# Patient Record
Sex: Male | Born: 1950
Health system: Southern US, Community
[De-identification: ages and names within clinical notes are randomized; demographics above are authoritative.]

## PROBLEM LIST (undated history)

## (undated) DIAGNOSIS — Z8719 Personal history of other diseases of the digestive system: Secondary | ICD-10-CM

## (undated) DIAGNOSIS — I739 Peripheral vascular disease, unspecified: Secondary | ICD-10-CM

## (undated) DIAGNOSIS — D649 Anemia, unspecified: Secondary | ICD-10-CM

## (undated) DIAGNOSIS — Z8711 Personal history of peptic ulcer disease: Secondary | ICD-10-CM

## (undated) DIAGNOSIS — I350 Nonrheumatic aortic (valve) stenosis: Secondary | ICD-10-CM

## (undated) DIAGNOSIS — M1990 Unspecified osteoarthritis, unspecified site: Secondary | ICD-10-CM

## (undated) DIAGNOSIS — E785 Hyperlipidemia, unspecified: Secondary | ICD-10-CM

## (undated) DIAGNOSIS — R011 Cardiac murmur, unspecified: Secondary | ICD-10-CM

## (undated) DIAGNOSIS — I10 Essential (primary) hypertension: Secondary | ICD-10-CM

## (undated) DIAGNOSIS — G629 Polyneuropathy, unspecified: Secondary | ICD-10-CM

## (undated) HISTORY — DX: Personal history of other diseases of the digestive system: Z87.19

## (undated) HISTORY — DX: Essential (primary) hypertension: I10

## (undated) HISTORY — DX: Hyperlipidemia, unspecified: E78.5

## (undated) HISTORY — DX: Personal history of peptic ulcer disease: Z87.11

## (undated) HISTORY — PX: CARPAL TUNNEL RELEASE: SHX101

## (undated) HISTORY — DX: Peripheral vascular disease, unspecified: I73.9

## (undated) HISTORY — DX: Unspecified osteoarthritis, unspecified site: M19.90

## (undated) HISTORY — DX: Nonrheumatic aortic (valve) stenosis: I35.0

---

## 1995-06-19 HISTORY — PX: BACK SURGERY: SHX140

## 2001-08-28 ENCOUNTER — Encounter: Payer: Self-pay | Admitting: Neurosurgery

## 2001-08-28 ENCOUNTER — Encounter: Admission: RE | Admit: 2001-08-28 | Discharge: 2001-08-28 | Payer: Self-pay | Admitting: Neurosurgery

## 2001-09-11 ENCOUNTER — Encounter: Payer: Self-pay | Admitting: Neurosurgery

## 2001-09-11 ENCOUNTER — Encounter: Admission: RE | Admit: 2001-09-11 | Discharge: 2001-09-11 | Payer: Self-pay | Admitting: Neurosurgery

## 2001-09-25 ENCOUNTER — Encounter: Admission: RE | Admit: 2001-09-25 | Discharge: 2001-09-25 | Payer: Self-pay | Admitting: Neurosurgery

## 2001-09-25 ENCOUNTER — Encounter: Payer: Self-pay | Admitting: Neurosurgery

## 2003-03-11 ENCOUNTER — Encounter: Payer: Self-pay | Admitting: Radiology

## 2003-03-11 ENCOUNTER — Encounter: Payer: Self-pay | Admitting: Neurosurgery

## 2003-03-11 ENCOUNTER — Encounter: Admission: RE | Admit: 2003-03-11 | Discharge: 2003-03-11 | Payer: Self-pay | Admitting: Neurosurgery

## 2003-03-26 ENCOUNTER — Encounter: Admission: RE | Admit: 2003-03-26 | Discharge: 2003-03-26 | Payer: Self-pay | Admitting: Neurosurgery

## 2003-03-26 ENCOUNTER — Encounter: Payer: Self-pay | Admitting: Neurosurgery

## 2003-04-19 ENCOUNTER — Encounter: Admission: RE | Admit: 2003-04-19 | Discharge: 2003-04-19 | Payer: Self-pay | Admitting: Neurosurgery

## 2005-12-13 ENCOUNTER — Ambulatory Visit (HOSPITAL_COMMUNITY): Admission: RE | Admit: 2005-12-13 | Discharge: 2005-12-13 | Payer: Self-pay | Admitting: Neurosurgery

## 2009-02-16 HISTORY — PX: COLONOSCOPY: SHX174

## 2011-02-07 ENCOUNTER — Ambulatory Visit (HOSPITAL_COMMUNITY)
Admission: RE | Admit: 2011-02-07 | Discharge: 2011-02-07 | Disposition: A | Discharge status: Home / Self Care | Payer: BC Managed Care – PPO | Source: Ambulatory Visit | Attending: Family Medicine | Admitting: Family Medicine

## 2011-02-07 ENCOUNTER — Other Ambulatory Visit (HOSPITAL_COMMUNITY): Payer: Self-pay | Admitting: Family Medicine

## 2011-02-07 DIAGNOSIS — IMO0002 Reserved for concepts with insufficient information to code with codable children: Secondary | ICD-10-CM

## 2011-02-07 DIAGNOSIS — M549 Dorsalgia, unspecified: Secondary | ICD-10-CM

## 2011-02-07 DIAGNOSIS — M545 Low back pain, unspecified: Secondary | ICD-10-CM | POA: Insufficient documentation

## 2011-02-07 DIAGNOSIS — M51379 Other intervertebral disc degeneration, lumbosacral region without mention of lumbar back pain or lower extremity pain: Secondary | ICD-10-CM | POA: Insufficient documentation

## 2011-02-07 DIAGNOSIS — M79609 Pain in unspecified limb: Secondary | ICD-10-CM | POA: Insufficient documentation

## 2011-02-07 DIAGNOSIS — M5137 Other intervertebral disc degeneration, lumbosacral region: Secondary | ICD-10-CM | POA: Insufficient documentation

## 2011-04-11 ENCOUNTER — Emergency Department (HOSPITAL_COMMUNITY): Payer: No Typology Code available for payment source

## 2011-04-11 ENCOUNTER — Emergency Department (HOSPITAL_COMMUNITY)
Admission: EM | Admit: 2011-04-11 | Discharge: 2011-04-11 | Disposition: A | Discharge status: Home / Self Care | Payer: No Typology Code available for payment source | Attending: Emergency Medicine | Admitting: Emergency Medicine

## 2011-04-11 DIAGNOSIS — M545 Low back pain, unspecified: Secondary | ICD-10-CM | POA: Insufficient documentation

## 2011-04-11 DIAGNOSIS — S32009A Unspecified fracture of unspecified lumbar vertebra, initial encounter for closed fracture: Secondary | ICD-10-CM | POA: Insufficient documentation

## 2011-04-11 DIAGNOSIS — I10 Essential (primary) hypertension: Secondary | ICD-10-CM | POA: Insufficient documentation

## 2011-04-20 ENCOUNTER — Other Ambulatory Visit: Payer: Self-pay | Admitting: Neurological Surgery

## 2011-04-20 DIAGNOSIS — M545 Low back pain, unspecified: Secondary | ICD-10-CM

## 2011-04-26 ENCOUNTER — Ambulatory Visit: Payer: BC Managed Care – PPO | Admitting: Orthopedic Surgery

## 2011-04-27 ENCOUNTER — Ambulatory Visit
Admission: RE | Admit: 2011-04-27 | Discharge: 2011-04-27 | Disposition: A | Discharge status: Home / Self Care | Payer: BC Managed Care – PPO | Source: Ambulatory Visit | Attending: Neurological Surgery | Admitting: Neurological Surgery

## 2011-04-27 DIAGNOSIS — M545 Low back pain, unspecified: Secondary | ICD-10-CM

## 2011-07-23 ENCOUNTER — Ambulatory Visit
Admission: RE | Admit: 2011-07-23 | Discharge: 2011-07-23 | Disposition: A | Discharge status: Home / Self Care | Payer: BC Managed Care – PPO | Source: Ambulatory Visit | Attending: Neurological Surgery | Admitting: Neurological Surgery

## 2011-07-23 ENCOUNTER — Other Ambulatory Visit: Payer: Self-pay | Admitting: Neurological Surgery

## 2011-07-23 DIAGNOSIS — IMO0002 Reserved for concepts with insufficient information to code with codable children: Secondary | ICD-10-CM

## 2011-07-23 DIAGNOSIS — M5126 Other intervertebral disc displacement, lumbar region: Secondary | ICD-10-CM

## 2011-08-06 ENCOUNTER — Other Ambulatory Visit: Payer: Self-pay | Admitting: Neurological Surgery

## 2011-08-06 DIAGNOSIS — M545 Low back pain, unspecified: Secondary | ICD-10-CM

## 2011-08-16 ENCOUNTER — Ambulatory Visit
Admission: RE | Admit: 2011-08-16 | Discharge: 2011-08-16 | Disposition: A | Discharge status: Home / Self Care | Payer: BC Managed Care – PPO | Source: Ambulatory Visit | Attending: Neurological Surgery | Admitting: Neurological Surgery

## 2011-08-16 DIAGNOSIS — M545 Low back pain, unspecified: Secondary | ICD-10-CM

## 2011-08-16 MED ORDER — DIAZEPAM 5 MG PO TABS
10.0000 mg | ORAL_TABLET | Freq: Once | ORAL | Status: AC
  Administered 2011-08-16: 10 mg via ORAL

## 2011-08-16 MED ORDER — HYDROCODONE-ACETAMINOPHEN 5-325 MG PO TABS
2.0000 | ORAL_TABLET | Freq: Once | ORAL | Status: AC
  Administered 2011-08-16: 2 via ORAL

## 2011-08-16 NOTE — Progress Notes (Signed)
Patient states he has been off Ultram for the past two days.  jkl

## 2011-08-17 NOTE — Discharge Instructions (Signed)
Myelogram Discharge Instructions  1. Go home and rest quietly for the next 24 hours.  It is important to lie flat for the next 24 hours.  Get up only to go to the restroom.  You may lie in the bed or on a couch on your back, your stomach, your left side or your right side.  You may have one pillow under your head.  You may have pillows between your knees while you are on your side or under your knees while you are on your back.  2. DO NOT drive today.  Recline the seat as far back as it will go, while still wearing your seat belt, on the way home.  3. You may get up to go to the bathroom as needed.  You may sit up for 10 minutes to eat.  You may resume your normal diet and medications unless otherwise indicated.  Drink lots of extra fluids today and tomorrow.  4. The incidence of headache, nausea, or vomiting is about 5% (one in 20 patients).  If you develop a headache, lie flat and drink plenty of fluids until the headache goes away.  Caffeinated beverages may be helpful.  If you develop severe nausea and vomiting or a headache that does not go away with flat bed rest, call [phone].  5. You may resume normal activities after your 24 hours of bed rest is over; however, do not exert yourself strongly or do any heavy lifting tomorrow.  6. Call your physician for a follow-up appointment.  The results of your myelogram will be sent directly to your physician by the following day.  7. If you have any questions or if complications develop after you arrive home, please call [phone].  Discharge instructions have been explained to the patient.  The patient, or the person responsible for the patient, fully understands these instructions.    May resume ultram on August 17, 2011 after 11:00 am.

## 2012-04-22 ENCOUNTER — Other Ambulatory Visit: Payer: Self-pay | Admitting: Neurological Surgery

## 2013-07-03 ENCOUNTER — Other Ambulatory Visit (HOSPITAL_COMMUNITY): Payer: Self-pay | Admitting: Family Medicine

## 2013-07-03 ENCOUNTER — Ambulatory Visit (HOSPITAL_COMMUNITY)
Admission: RE | Admit: 2013-07-03 | Discharge: 2013-07-03 | Disposition: A | Discharge status: Home / Self Care | Payer: BC Managed Care – PPO | Source: Ambulatory Visit | Attending: Family Medicine | Admitting: Family Medicine

## 2013-07-03 DIAGNOSIS — R0609 Other forms of dyspnea: Secondary | ICD-10-CM | POA: Insufficient documentation

## 2013-07-03 DIAGNOSIS — R0602 Shortness of breath: Secondary | ICD-10-CM | POA: Insufficient documentation

## 2013-07-03 DIAGNOSIS — R0989 Other specified symptoms and signs involving the circulatory and respiratory systems: Secondary | ICD-10-CM | POA: Insufficient documentation

## 2013-07-03 DIAGNOSIS — R0600 Dyspnea, unspecified: Secondary | ICD-10-CM

## 2013-07-03 DIAGNOSIS — R61 Generalized hyperhidrosis: Secondary | ICD-10-CM | POA: Insufficient documentation

## 2015-07-19 DIAGNOSIS — M47816 Spondylosis without myelopathy or radiculopathy, lumbar region: Secondary | ICD-10-CM | POA: Diagnosis not present

## 2015-07-19 DIAGNOSIS — M5106 Intervertebral disc disorders with myelopathy, lumbar region: Secondary | ICD-10-CM | POA: Diagnosis not present

## 2015-08-04 DIAGNOSIS — Z9889 Other specified postprocedural states: Secondary | ICD-10-CM | POA: Diagnosis not present

## 2015-08-04 DIAGNOSIS — M9983 Other biomechanical lesions of lumbar region: Secondary | ICD-10-CM | POA: Diagnosis not present

## 2015-08-04 DIAGNOSIS — M5126 Other intervertebral disc displacement, lumbar region: Secondary | ICD-10-CM | POA: Diagnosis not present

## 2015-08-04 DIAGNOSIS — M47816 Spondylosis without myelopathy or radiculopathy, lumbar region: Secondary | ICD-10-CM | POA: Diagnosis not present

## 2015-08-04 DIAGNOSIS — M4855XD Collapsed vertebra, not elsewhere classified, thoracolumbar region, subsequent encounter for fracture with routine healing: Secondary | ICD-10-CM | POA: Diagnosis not present

## 2015-08-26 DIAGNOSIS — M4306 Spondylolysis, lumbar region: Secondary | ICD-10-CM | POA: Diagnosis not present

## 2015-08-26 DIAGNOSIS — M47816 Spondylosis without myelopathy or radiculopathy, lumbar region: Secondary | ICD-10-CM | POA: Diagnosis not present

## 2015-08-30 DIAGNOSIS — J019 Acute sinusitis, unspecified: Secondary | ICD-10-CM | POA: Diagnosis not present

## 2015-08-30 DIAGNOSIS — R030 Elevated blood-pressure reading, without diagnosis of hypertension: Secondary | ICD-10-CM | POA: Diagnosis not present

## 2015-09-02 DIAGNOSIS — J40 Bronchitis, not specified as acute or chronic: Secondary | ICD-10-CM | POA: Diagnosis not present

## 2015-10-10 DIAGNOSIS — I10 Essential (primary) hypertension: Secondary | ICD-10-CM | POA: Diagnosis not present

## 2015-10-10 DIAGNOSIS — E291 Testicular hypofunction: Secondary | ICD-10-CM | POA: Diagnosis not present

## 2015-10-10 DIAGNOSIS — E781 Pure hyperglyceridemia: Secondary | ICD-10-CM | POA: Diagnosis not present

## 2015-10-10 DIAGNOSIS — E8881 Metabolic syndrome: Secondary | ICD-10-CM | POA: Diagnosis not present

## 2015-10-10 DIAGNOSIS — R6889 Other general symptoms and signs: Secondary | ICD-10-CM | POA: Diagnosis not present

## 2015-10-10 DIAGNOSIS — R946 Abnormal results of thyroid function studies: Secondary | ICD-10-CM | POA: Diagnosis not present

## 2015-10-12 DIAGNOSIS — E785 Hyperlipidemia, unspecified: Secondary | ICD-10-CM | POA: Diagnosis not present

## 2015-10-12 DIAGNOSIS — N400 Enlarged prostate without lower urinary tract symptoms: Secondary | ICD-10-CM | POA: Diagnosis not present

## 2015-10-12 DIAGNOSIS — I10 Essential (primary) hypertension: Secondary | ICD-10-CM | POA: Diagnosis not present

## 2015-10-12 DIAGNOSIS — Z713 Dietary counseling and surveillance: Secondary | ICD-10-CM | POA: Diagnosis not present

## 2015-10-27 DIAGNOSIS — G5711 Meralgia paresthetica, right lower limb: Secondary | ICD-10-CM | POA: Diagnosis not present

## 2015-10-27 DIAGNOSIS — M4306 Spondylolysis, lumbar region: Secondary | ICD-10-CM | POA: Diagnosis not present

## 2015-10-27 DIAGNOSIS — M47816 Spondylosis without myelopathy or radiculopathy, lumbar region: Secondary | ICD-10-CM | POA: Diagnosis not present

## 2016-01-09 DIAGNOSIS — I10 Essential (primary) hypertension: Secondary | ICD-10-CM | POA: Diagnosis not present

## 2016-01-11 DIAGNOSIS — G600 Hereditary motor and sensory neuropathy: Secondary | ICD-10-CM | POA: Diagnosis not present

## 2016-01-11 DIAGNOSIS — E785 Hyperlipidemia, unspecified: Secondary | ICD-10-CM | POA: Diagnosis not present

## 2016-01-11 DIAGNOSIS — Z713 Dietary counseling and surveillance: Secondary | ICD-10-CM | POA: Diagnosis not present

## 2016-01-11 DIAGNOSIS — I781 Nevus, non-neoplastic: Secondary | ICD-10-CM | POA: Diagnosis not present

## 2016-02-01 DIAGNOSIS — M4306 Spondylolysis, lumbar region: Secondary | ICD-10-CM | POA: Diagnosis not present

## 2016-02-01 DIAGNOSIS — M5106 Intervertebral disc disorders with myelopathy, lumbar region: Secondary | ICD-10-CM | POA: Diagnosis not present

## 2016-02-01 DIAGNOSIS — M47816 Spondylosis without myelopathy or radiculopathy, lumbar region: Secondary | ICD-10-CM | POA: Diagnosis not present

## 2016-04-06 DIAGNOSIS — E785 Hyperlipidemia, unspecified: Secondary | ICD-10-CM | POA: Diagnosis not present

## 2016-04-10 DIAGNOSIS — M545 Low back pain: Secondary | ICD-10-CM | POA: Diagnosis not present

## 2016-04-10 DIAGNOSIS — Z0000 Encounter for general adult medical examination without abnormal findings: Secondary | ICD-10-CM | POA: Diagnosis not present

## 2016-04-10 DIAGNOSIS — E785 Hyperlipidemia, unspecified: Secondary | ICD-10-CM | POA: Diagnosis not present

## 2016-05-04 DIAGNOSIS — M4306 Spondylolysis, lumbar region: Secondary | ICD-10-CM | POA: Diagnosis not present

## 2016-05-04 DIAGNOSIS — M47816 Spondylosis without myelopathy or radiculopathy, lumbar region: Secondary | ICD-10-CM | POA: Diagnosis not present

## 2016-05-04 DIAGNOSIS — M25512 Pain in left shoulder: Secondary | ICD-10-CM | POA: Diagnosis not present

## 2016-05-08 DIAGNOSIS — M25512 Pain in left shoulder: Secondary | ICD-10-CM | POA: Diagnosis not present

## 2016-08-14 DIAGNOSIS — M25512 Pain in left shoulder: Secondary | ICD-10-CM | POA: Diagnosis not present

## 2016-08-31 DIAGNOSIS — M25512 Pain in left shoulder: Secondary | ICD-10-CM | POA: Diagnosis not present

## 2016-08-31 DIAGNOSIS — M75102 Unspecified rotator cuff tear or rupture of left shoulder, not specified as traumatic: Secondary | ICD-10-CM | POA: Diagnosis not present

## 2016-08-31 DIAGNOSIS — M719 Bursopathy, unspecified: Secondary | ICD-10-CM | POA: Diagnosis not present

## 2016-08-31 DIAGNOSIS — S46012A Strain of muscle(s) and tendon(s) of the rotator cuff of left shoulder, initial encounter: Secondary | ICD-10-CM | POA: Diagnosis not present

## 2016-09-07 DIAGNOSIS — M25512 Pain in left shoulder: Secondary | ICD-10-CM | POA: Diagnosis not present

## 2016-10-15 ENCOUNTER — Ambulatory Visit (INDEPENDENT_AMBULATORY_CARE_PROVIDER_SITE_OTHER): Payer: PPO | Admitting: Orthopedic Surgery

## 2016-10-15 ENCOUNTER — Encounter: Payer: Self-pay | Admitting: Orthopedic Surgery

## 2016-10-15 VITALS — BP 186/98 | HR 64 | Ht 72.0 in | Wt 185.0 lb

## 2016-10-15 DIAGNOSIS — M75102 Unspecified rotator cuff tear or rupture of left shoulder, not specified as traumatic: Secondary | ICD-10-CM

## 2016-10-15 NOTE — Progress Notes (Signed)
Patient ID: Charles Ayala, male   DOB: 02/17/1951, 66 y.o.   MRN: [id number]  Chief Complaint  Patient presents with  . Shoulder Injury    LEFT SHOULDER TENDON TEAR    HPI Charles Ayala is a 66 y.o. male.   66 year old male who is right-hand dominant followed by Dr. Carloyn Manner for lumbar spine condition and also some neck pain but presents to [place] with a chief complaint of painful left shoulder 1 month with no history of injury. He has pain and weakness with forward elevation he has nocturnal symptoms and he can't lie on his left side. He is not had any therapy or medication at this time his x-ray was normal his MRI showed a partial undersurface tear of his left rotator cuff    Review of Systems Review of Systems Normal neuro  Denies fever Neck pain radiates straight down his back History lumbar disc disease  Past Medical History:  Diagnosis Date  . Arthritis   . History of stomach ulcers     Past Surgical History:  Procedure Laterality Date  . BACK SURGERY  1997   L4 L5  . CARPAL TUNNEL RELEASE        No family history on file. The patient was quizzed about their family history and reported no history of bleeding problems or anesthesia problems in their family  Social History Social History  Substance Use Topics  . Smoking status: Never Smoker  . Smokeless tobacco: Never Used  . Alcohol use Not on file    No Known Allergies  Current Outpatient Prescriptions  Medication Sig Dispense Refill  . gabapentin (NEURONTIN) 300 MG capsule     . HYDROcodone-acetaminophen (NORCO) 7.5-325 MG tablet     . tamsulosin (FLOMAX) 0.4 MG CAPS capsule     . traMADol (ULTRAM) 50 MG tablet Take by mouth every 6 (six) hours as needed.     No current facility-administered medications for this visit.        Physical Exam BP (!) 186/98   Pulse 64   Ht 6' (1.829 m)   Wt 185 lb (83.9 kg)   BMI 25.09 kg/m  Physical Exam The patient is well developed well nourished and well groomed.   Orientation to person place and time is normal  Mood is pleasant.  Ambulatory status NORMAL  Cervical spine exam is as follows NO TENDERNESS   Ortho Exam LEFT shoulder  Examination: Inspection reveals tenderness around the peri-acromial region. The patient has decreased range of motion 85 FLEXION 50 EXT ROT  and grade 4/ 5 motor function of the rotator cuff. Stability in abduction external rotation is normal.   Neurovascular examination is intact and the lymph nodes in the axilla and supraclavicular regions are normal   The opposite shoulder RIGHT  exhibits normal range of motion stability and strength neurovascular exam is intact, lymph nodes are negative and there is no swelling or tenderness   Data Reviewed IMAGING: AP/LAT L Shoulder: I have read and interpret the x-ray as follows:   type II acromion normal glenohumeral joint  MRI undersurface tear left rotator cuff PASTA TYPE TEAR   Assessment    LEFT PARTIAL ROTATOR CUFF TEAR   SHOULDER PAIN     Plan  Therapy INJECTION F/U 6 WEEKS    Procedure note the subacromial injection shoulder left   Verbal consent was obtained to inject the  Left   Shoulder  Timeout was completed to confirm the injection site is a subacromial space of the  left  shoulder  Medication used Depo-Medrol 40 mg and lidocaine 1% 3 cc  Anesthesia was provided by ethyl chloride  The injection was performed in the left  posterior subacromial space. After pinning the skin with alcohol and anesthetized the skin with ethyl chloride the subacromial space was injected using a 20-gauge needle. There were no complications  Sterile dressing was applied.          Arther Abbott, MD 10/15/2016 9:37 AM

## 2016-10-15 NOTE — Patient Instructions (Addendum)
You have received an injection of steroids into the joint. 15% of patients will have increased pain within the 24 hours postinjection.   This is transient and will go away.   We recommend that you use ice packs on the injection site for 20 minutes every 2 hours and extra strength Tylenol 2 tablets every 8 as needed until the pain resolves.  If you continue to have pain after taking the Tylenol and using the ice please call the office for further instructions.   Rotator Cuff Injury Rotator cuff injury is any type of injury to the set of muscles and tendons that make up the stabilizing unit of your shoulder. This unit holds the ball of your upper arm bone (humerus) in the socket of your shoulder blade (scapula). What are the causes? Injuries to your rotator cuff most commonly come from sports or activities that cause your arm to be moved repeatedly over your head. Examples of this include throwing, weight lifting, swimming, or racquet sports. Long lasting (chronic) irritation of your rotator cuff can cause soreness and swelling (inflammation), bursitis, and eventual damage to your tendons, such as a tear (rupture). What are the signs or symptoms? Acute rotator cuff tear:  Sudden tearing sensation followed by severe pain shooting from your upper shoulder down your arm toward your elbow.  Decreased range of motion of your shoulder because of pain and muscle spasm.  Severe pain.  Inability to raise your arm out to the side because of pain and loss of muscle power (large tears). Chronic rotator cuff tear:  Pain that usually is worse at night and may interfere with sleep.  Gradual weakness and decreased shoulder motion as the pain worsens.  Decreased range of motion. Rotator cuff tendinitis:  Deep ache in your shoulder and the outside upper arm over your shoulder.  Pain that comes on gradually and becomes worse when lifting your arm to the side or turning it inward. How is this  diagnosed? Rotator cuff injury is diagnosed through a medical history, physical exam, and imaging exam. The medical history helps determine the type of rotator cuff injury. Your health care provider will look at your injured shoulder, feel the injured area, and ask you to move your shoulder in different positions. X-ray exams typically are done to rule out other causes of shoulder pain, such as fractures. MRI is the exam of choice for the most severe shoulder injuries because the images show muscles and tendons. How is this treated? Chronic tear:  Medicine for pain, such as acetaminophen or ibuprofen.  Physical therapy and range-of-motion exercises may be helpful in maintaining shoulder function and strength.  Steroid injections into your shoulder joint.  Surgical repair of the rotator cuff if the injury does not heal with noninvasive treatment. Acute tear:  Anti-inflammatory medicines such as ibuprofen and naproxen to help reduce pain and swelling.  A sling to help support your arm and rest your rotator cuff muscles. Long-term use of a sling is not advised. It may cause significant stiffening of the shoulder joint.  Surgery may be considered within a few weeks, especially in younger, active people, to return the shoulder to full function.  Indications for surgical treatment include the following:  Age younger than 62 years.  Rotator cuff tears that are complete.  Physical therapy, rest, and anti-inflammatory medicines have been used for 6-8 weeks, with no improvement.  Employment or sporting activity that requires constant shoulder use. Tendinitis:  Anti-inflammatory medicines such as ibuprofen and naproxen  to help reduce pain and swelling.  A sling to help support your arm and rest your rotator cuff muscles. Long-term use of a sling is not advised. It may cause significant stiffening of the shoulder joint.  Severe tendinitis may require:  Steroid injections into your shoulder  joint.  Physical therapy.  Surgery. Follow these instructions at home:  Apply ice to your injury:  Put ice in a plastic bag.  Place a towel between your skin and the bag.  Leave the ice on for 20 minutes, 2-3 times a day.  If you have a shoulder immobilizer (sling and straps), wear it until told otherwise by your health care provider.  You may want to sleep on several pillows or in a recliner at night to lessen swelling and pain.  Only take over-the-counter or prescription medicines for pain, discomfort, or fever as directed by your health care provider.  Do simple hand squeezing exercises with a soft rubber ball to decrease hand swelling. Contact a health care provider if:  Your shoulder pain increases, or new pain or numbness develops in your arm, hand, or fingers.  Your hand or fingers are colder than your other hand. Get help right away if:  Your arm, hand, or fingers are numb or tingling.  Your arm, hand, or fingers are increasingly swollen and painful, or they turn white or blue. This information is not intended to replace advice given to you by your health care provider. Make sure you discuss any questions you have with your health care provider. Document Released: 06/01/2000 Document Revised: 11/10/2015 Document Reviewed: 01/14/2013 Elsevier Interactive Patient Education  2017 [employer].

## 2016-10-22 ENCOUNTER — Ambulatory Visit (HOSPITAL_COMMUNITY): Payer: PPO

## 2016-10-25 ENCOUNTER — Encounter (HOSPITAL_COMMUNITY): Payer: Self-pay | Admitting: Occupational Therapy

## 2016-10-25 ENCOUNTER — Ambulatory Visit (HOSPITAL_COMMUNITY): Payer: PPO | Attending: Orthopedic Surgery | Admitting: Occupational Therapy

## 2016-10-25 DIAGNOSIS — R29898 Other symptoms and signs involving the musculoskeletal system: Secondary | ICD-10-CM | POA: Diagnosis not present

## 2016-10-25 DIAGNOSIS — M25512 Pain in left shoulder: Secondary | ICD-10-CM | POA: Diagnosis not present

## 2016-10-25 DIAGNOSIS — M25612 Stiffness of left shoulder, not elsewhere classified: Secondary | ICD-10-CM

## 2016-10-25 NOTE — Therapy (Signed)
[place], [place], [address] Phone: [phone]   Fax:  [phone]  Occupational Therapy Evaluation  Patient Details  Name: Charles Ayala MRN: [id number] Date of Birth: 01-30-1951 Referring Provider: Dr. Arther Abbott  Encounter Date: 10/25/2016      OT End of Session - 10/25/16 2030    Visit Number 1   Number of Visits 12   Date for OT Re-Evaluation 12/08/16  mini-reassessment 11/24/16   Authorization Type [employer]   Authorization Time Period Before 10th visit   Authorization - Visit Number 1   Authorization - Number of Visits 10   OT Start Time 7619   OT Stop Time 1503   OT Time Calculation (min) 26 min   Activity Tolerance Patient tolerated treatment well   Behavior During Therapy [facility] for tasks assessed/performed      Past Medical History:  Diagnosis Date  . Arthritis   . History of stomach ulcers     Past Surgical History:  Procedure Laterality Date  . BACK SURGERY  1997   L4 L5  . CARPAL TUNNEL RELEASE      There were no vitals filed for this visit.      Subjective Assessment - 10/25/16 2025    Subjective  S: It's been hurting for a while but has been worse recently.    Pertinent History Pt is a 66 y/o male presenting with left rotator cuff tear, per chart review he has incurred a partial undersurface tear. Pt received subacromial injection on 10/15/16, reports it helped for a short time. Pt was referred to occupational therapy by Dr. Arther Abbott.    Special Tests FOTO 45/100 (55% impairment)   Patient Stated Goals To be able to use my left arm more.    Currently in Pain? No/denies           [facility] OT Assessment - 10/25/16 1437      Assessment   Diagnosis left rotator cuff tear   Referring Provider Dr. Arther Abbott   Onset Date --  1 month ago   Prior Therapy None     Precautions   Precautions None     Balance Screen   Has the patient fallen in the past  6 months No   Has the patient had a decrease in activity level because of a fear of falling?  No   Is the patient reluctant to leave their home because of a fear of falling?  No     Prior Function   Level of Independence Independent   Vocation Retired   Leisure mowing, Haematologist, gardening     ADL   ADL comments Pt is having difficulty sleeping, reaching overhead, lifting items     Written Expression   Dominant Hand Right     Cognition   Overall Cognitive Status Within Functional Limits for tasks assessed     ROM / Strength   AROM / PROM / Strength AROM;PROM;Strength     Palpation   Palpation comment Moderate fascial restrictions in left upper arm, trapezius, and scapularis regions     AROM   Overall AROM Comments Assessed seated, er/IR adducted   AROM Assessment Site Shoulder   Right/Left Shoulder Left   Left Shoulder Flexion 89 Degrees   Left Shoulder ABduction 112 Degrees   Left Shoulder Internal Rotation 90 Degrees   Left Shoulder External Rotation 48 Degrees     PROM   Overall PROM Comments Assessed supine, er/IR adducted  PROM Assessment Site Shoulder   Right/Left Shoulder Left   Left Shoulder Flexion 154 Degrees   Left Shoulder ABduction 180 Degrees   Left Shoulder Internal Rotation 90 Degrees   Left Shoulder External Rotation 63 Degrees     Strength   Overall Strength Comments Assessed seated, er/IR adducted   Strength Assessment Site Shoulder   Right/Left Shoulder Left   Left Shoulder Flexion 3-/5   Left Shoulder ABduction 3/5   Left Shoulder Internal Rotation 3/5   Left Shoulder External Rotation 3-/5                         OT Education - 10/25/16 2029    Education provided Yes   Education Details table slides   Person(s) Educated Patient   Methods Explanation;Demonstration;Handout   Comprehension Verbalized understanding;Returned demonstration          OT Short Term Goals - 10/25/16 2034      OT SHORT TERM GOAL #1   Title  Pt will be educated on HEP to improve LUE functional use as non-dominant arm.    Time 3   Period Weeks   Status New     OT SHORT TERM GOAL #2   Title Pt will improve LUE P/ROM to WNL to improve ability to perform ADL tasks using LUE as assist.    Time 3   Period Weeks   Status New     OT SHORT TERM GOAL #3   Title Pt will improve strength in LUE to 3+/5 to improve ability to grasp items as waist level.    Time 3   Period Weeks   Status New           OT Long Term Goals - 10/25/16 2036      OT LONG TERM GOAL #1   Title Pt will return to highest level of functioning and independence using LUE as assist during ADL and leisure tasks.    Time 6   Period Weeks   Status New     OT LONG TERM GOAL #2   Title Pt will decrease LUE fascial restrictions to minimal amounts or less to improve mobility required for functional reaching.    Time 6   Period Weeks   Status New     OT LONG TERM GOAL #3   Title Pt will decrease LUE pain to 2/10 or less to improve ability to use LUE during grooming tasks.    Time 6   Period Weeks   Status New     OT LONG TERM GOAL #4   Title Pt will increase LUE A/ROM to [facility] to improve ability to reach into overhead cabinets.    Time 6   Period Weeks   Status New     OT LONG TERM GOAL #5   Title Pt will improve LUE strength to 4/5 to improve ability to mow the lawn using LUE as assist.    Time 6   Period Weeks   Status New               Plan - 10/25/16 2031    Clinical Impression Statement A: Pt is a 66 y/o male presenting with partial left rotator cuff tear limiting ability to use LUE during functional task completion with ADL and leisure activities. Pt is using heat and pain medications for pain management at this time. Educated on table slide HEP.    Rehab Potential Good   OT Frequency 2x /  week   OT Duration 6 weeks   OT Treatment/Interventions Self-care/ADL training;Electrical Stimulation;Therapeutic exercise;Moist Heat;Patient/family  education;Cryotherapy;Ultrasound;Manual Therapy;Passive range of motion;Therapeutic activities   Plan P: Pt will benefit from skilled OT services to decrease pain and fascial restrictions, increase joint range of motion and strength, and improve functional use of LUE as non-dominant arm. Treatment plan: myofascial release, manual therapy, P/ROM, AA/ROM, A/ROM, general LUE strengthening, scapular mobility and strengthening, modalities as needed.    OT Home Exercise Plan 5/10: table slides   Consulted and Agree with Plan of Care Patient      Patient will benefit from skilled therapeutic intervention in order to improve the following deficits and impairments:  Pain, Decreased range of motion, Decreased activity tolerance, Decreased strength, Increased fascial restricitons, Impaired UE functional use  Visit Diagnosis: Acute pain of left shoulder  Stiffness of left shoulder, not elsewhere classified  Other symptoms and signs involving the musculoskeletal system      G-Codes - 11-13-16 10-03-37    Functional Assessment Tool Used (Outpatient only) FOTO score: 45/100 (55% impairment)   Functional Limitation Changing and maintaining body position   Changing and Maintaining Body Position Current Status (J6283) At least 40 percent but less than 60 percent impaired, limited or restricted   Changing and Maintaining Body Position Goal Status (M6294) At least 20 percent but less than 40 percent impaired, limited or restricted      Problem List There are no active problems to display for this patient.  Guadelupe Sabin, OTR/L  [phone] 2016/11/13, 8:40 PM  [place] [address] [place], [place], [address] Phone: [phone]   Fax:  [phone]  Name: Charles Ayala MRN: [id number] Date of Birth: [birth year]

## 2016-10-25 NOTE — Patient Instructions (Signed)
SHOULDER: Flexion On Table   Place hands on table, elbows straight. Move hips away from body. Press hands down into table. _10-15__ reps per set, __1-3_ sets per day  Abduction (Passive)   With arm out to side, resting on table, lower head toward arm, keeping trunk away from table. Repeat _10-15___ times. Do _1-3___ sessions per day.  Copyright  VHI. All rights reserved.     Internal Rotation (Assistive)   Seated with elbow bent at right angle and held against side, slide arm on table surface in an inward arc. Repeat __10-15__ times. Do _1-3___ sessions per day. Activity: Use this motion to brush crumbs off the table.  Copyright  VHI. All rights reserved.    

## 2016-10-30 ENCOUNTER — Ambulatory Visit (HOSPITAL_COMMUNITY): Payer: PPO

## 2016-10-30 ENCOUNTER — Encounter (HOSPITAL_COMMUNITY): Payer: Self-pay

## 2016-10-30 DIAGNOSIS — M25512 Pain in left shoulder: Secondary | ICD-10-CM | POA: Diagnosis not present

## 2016-10-30 DIAGNOSIS — M25612 Stiffness of left shoulder, not elsewhere classified: Secondary | ICD-10-CM

## 2016-10-30 DIAGNOSIS — R29898 Other symptoms and signs involving the musculoskeletal system: Secondary | ICD-10-CM

## 2016-10-30 NOTE — Patient Instructions (Signed)
Perform each exercise ___10-15_____ reps. 2-3x days.   Protraction - STANDING  Start by holding a wand or cane at chest height.  Next, slowly push the wand outwards in front of your body so that your elbows become fully straightened. Then, return to the original position.     Shoulder FLEXION - STANDING - PALMS DOWM  In the standing position, hold a wand/cane with both arms, palms down on both sides. Raise up the wand/cane allowing your unaffected arm to perform most of the effort. Your affected arm should be partially relaxed.      Internal/External ROTATION - STANDING  In the standing position, hold a wand/cane with both hands keeping your elbows bent. Move your arms and wand/cane to one side.  Your affected arm should be partially relaxed while your unaffected arm performs most of the effort.       Shoulder ABDUCTION - STANDING  While holding a wand/cane palm face up on the injured side and palm face down on the uninjured side, slowly raise up your injured arm to the side.             Horizontal Abduction/Adduction      Straight arms holding cane at shoulder height, bring cane to right, center, left. Repeat starting to left.   Copyright  VHI. All rights reserved.

## 2016-10-30 NOTE — Therapy (Signed)
[place], [place], [address] Phone: [phone]   Fax:  [phone]  Occupational Therapy Treatment  Patient Details  Name: Charles Ayala MRN: [id number] Date of Birth: 04-Jul-1950 Referring Provider: Dr. Arther Abbott  Encounter Date: 10/30/2016      OT End of Session - 10/30/16 1422    Visit Number 2   Number of Visits 12   Date for OT Re-Evaluation 12/08/16  mini-reassessment 11/24/16   Authorization Type [employer]   Authorization Time Period Before 10th visit   Authorization - Visit Number 2   Authorization - Number of Visits 10   OT Start Time 1350   OT Stop Time 1430   OT Time Calculation (min) 40 min   Activity Tolerance Patient tolerated treatment well   Behavior During Therapy [facility] for tasks assessed/performed      Past Medical History:  Diagnosis Date  . Arthritis   . History of stomach ulcers     Past Surgical History:  Procedure Laterality Date  . BACK SURGERY  1997   L4 L5  . CARPAL TUNNEL RELEASE      There were no vitals filed for this visit.          [facility] OT Assessment - 10/30/16 1418      Assessment   Diagnosis left rotator cuff tear     Precautions   Precautions None                  OT Treatments/Exercises (OP) - 10/30/16 1418      Exercises   Exercises Shoulder     Shoulder Exercises: Supine   Protraction PROM;AAROM;10 reps   Horizontal ABduction PROM;AAROM;10 reps   External Rotation PROM;AAROM;10 reps   Internal Rotation PROM;AAROM;10 reps   Flexion PROM;AAROM;10 reps   ABduction PROM;AAROM;10 reps     Manual Therapy   Manual Therapy Myofascial release   Manual therapy comments Manual therapy completed prior to exercises.   Myofascial Release Myofascial release and manual stretching completed to Left upper arm, trapeziu, and scapularis region to decreased fascial restrictions and increase joint mobility in a pain free zone.                  OT Education - 10/30/16 1420    Education provided Yes   Education Details patient was given OT evaluation handout. Reviewed goals and plan of care.   Person(s) Educated Patient   Methods Explanation;Handout   Comprehension Verbalized understanding          OT Short Term Goals - 10/30/16 1358      OT SHORT TERM GOAL #1   Title Pt will be educated on HEP to improve LUE functional use as non-dominant arm.    Time 3   Period Weeks   Status On-going     OT SHORT TERM GOAL #2   Title Pt will improve LUE P/ROM to WNL to improve ability to perform ADL tasks using LUE as assist.    Time 3   Period Weeks   Status On-going     OT SHORT TERM GOAL #3   Title Pt will improve strength in LUE to 3+/5 to improve ability to grasp items as waist level.    Time 3   Period Weeks   Status On-going           OT Long Term Goals - 10/30/16 1358      OT LONG TERM GOAL #1   Title  Pt will return to highest level of functioning and independence using LUE as assist during ADL and leisure tasks.    Time 6   Period Weeks   Status On-going     OT LONG TERM GOAL #2   Title Pt will decrease LUE fascial restrictions to minimal amounts or less to improve mobility required for functional reaching.    Time 6   Period Weeks   Status On-going     OT LONG TERM GOAL #3   Title Pt will decrease LUE pain to 2/10 or less to improve ability to use LUE during grooming tasks.    Time 6   Period Weeks   Status On-going     OT LONG TERM GOAL #4   Title Pt will increase LUE A/ROM to [facility] to improve ability to reach into overhead cabinets.    Time 6   Period Weeks   Status On-going     OT LONG TERM GOAL #5   Title Pt will improve LUE strength to 4/5 to improve ability to mow the lawn using LUE as assist.    Time 6   Period Weeks   Status On-going               Plan - 10/30/16 1527    Clinical Impression Statement A: Initiated myofascial release and manual stretching  to left upper arm. Pt with trigger points in anterior deltoid and trapezius region. Pt completed AA/ROM supine and HEP was updated. VC for form and technique.   Plan P: Complete AA/ROM seated. Add wall wash.      Patient will benefit from skilled therapeutic intervention in order to improve the following deficits and impairments:  Pain, Decreased range of motion, Decreased activity tolerance, Decreased strength, Increased fascial restricitons, Impaired UE functional use  Visit Diagnosis: Acute pain of left shoulder  Stiffness of left shoulder, not elsewhere classified  Other symptoms and signs involving the musculoskeletal system    Problem List There are no active problems to display for this patient.  Ailene Ravel, OTR/L,CBIS  [phone]  10/30/2016, 3:30 PM  [facility] [address] [place], [place], [address] Phone: [phone]   Fax:  [phone]  Name: Charles Ayala MRN: [id number] Date of Birth: 05/05/1951

## 2016-11-01 ENCOUNTER — Encounter (HOSPITAL_COMMUNITY): Payer: Self-pay | Admitting: Occupational Therapy

## 2016-11-01 ENCOUNTER — Ambulatory Visit (HOSPITAL_COMMUNITY): Payer: PPO | Admitting: Occupational Therapy

## 2016-11-01 DIAGNOSIS — M25512 Pain in left shoulder: Secondary | ICD-10-CM

## 2016-11-01 DIAGNOSIS — M25612 Stiffness of left shoulder, not elsewhere classified: Secondary | ICD-10-CM

## 2016-11-01 DIAGNOSIS — R29898 Other symptoms and signs involving the musculoskeletal system: Secondary | ICD-10-CM

## 2016-11-01 NOTE — Therapy (Signed)
[place], [place], [address] Phone: [phone]   Fax:  [phone]  Occupational Therapy Treatment  Patient Details  Name: Charles Ayala MRN: [id number] Date of Birth: 11/07/1950 Referring Provider: Dr. Arther Abbott  Encounter Date: 11/01/2016      OT End of Session - 11/01/16 1431    Visit Number 3   Number of Visits 12   Date for OT Re-Evaluation 12/08/16  mini-reassessment 11/24/16   Authorization Type [employer]   Authorization Time Period Before 10th visit   Authorization - Visit Number 3   Authorization - Number of Visits 10   OT Start Time 9702   OT Stop Time 1430   OT Time Calculation (min) 42 min   Activity Tolerance Patient tolerated treatment well   Behavior During Therapy [facility] for tasks assessed/performed      Past Medical History:  Diagnosis Date  . Arthritis   . History of stomach ulcers     Past Surgical History:  Procedure Laterality Date  . BACK SURGERY  1997   L4 L5  . CARPAL TUNNEL RELEASE      There were no vitals filed for this visit.      Subjective Assessment - 11/01/16 1349    Subjective  S: My shoulder is a little tender when I move it certain ways.    Currently in Pain? No/denies            [facility] OT Assessment - 11/01/16 1348      Assessment   Diagnosis left rotator cuff tear     Precautions   Precautions None                  OT Treatments/Exercises (OP) - 11/01/16 1351      Exercises   Exercises Shoulder     Shoulder Exercises: Supine   Protraction PROM;AAROM;10 reps   Horizontal ABduction PROM;AAROM;10 reps   External Rotation PROM;AAROM;10 reps   Internal Rotation PROM;AAROM;10 reps   Flexion PROM;AAROM;10 reps   ABduction PROM;AAROM;10 reps     Shoulder Exercises: Seated   Protraction AAROM;10 reps   Horizontal ABduction AAROM;10 reps   External Rotation AAROM;10 reps   Internal Rotation AAROM;10 reps   Flexion  AAROM;10 reps   Abduction AAROM;10 reps     Shoulder Exercises: Standing   Extension Theraband;10 reps   Theraband Level (Shoulder Extension) Level 2 (Red)   Row Theraband;10 reps   Theraband Level (Shoulder Row) Level 2 (Red)   Retraction Theraband;10 reps   Theraband Level (Shoulder Retraction) Level 2 (Red)     Shoulder Exercises: ROM/Strengthening   UBE (Upper Arm Bike) Level 1 1' forward 1' reverse   Thumb Tacks 1'   Proximal Shoulder Strengthening, Supine 10X each no rest break   Prot/Ret//Elev/Dep 1'     Manual Therapy   Manual Therapy Myofascial release   Manual therapy comments Manual therapy completed prior to exercises.   Myofascial Release Myofascial release and manual stretching completed to Left upper arm, trapeziu, and scapularis region to decreased fascial restrictions and increase joint mobility in a pain free zone.                   OT Short Term Goals - 10/30/16 1358      OT SHORT TERM GOAL #1   Title Pt will be educated on HEP to improve LUE functional use as non-dominant arm.    Time 3   Period Weeks  Status On-going     OT SHORT TERM GOAL #2   Title Pt will improve LUE P/ROM to WNL to improve ability to perform ADL tasks using LUE as assist.    Time 3   Period Weeks   Status On-going     OT SHORT TERM GOAL #3   Title Pt will improve strength in LUE to 3+/5 to improve ability to grasp items as waist level.    Time 3   Period Weeks   Status On-going           OT Long Term Goals - 10/30/16 1358      OT LONG TERM GOAL #1   Title Pt will return to highest level of functioning and independence using LUE as assist during ADL and leisure tasks.    Time 6   Period Weeks   Status On-going     OT LONG TERM GOAL #2   Title Pt will decrease LUE fascial restrictions to minimal amounts or less to improve mobility required for functional reaching.    Time 6   Period Weeks   Status On-going     OT LONG TERM GOAL #3   Title Pt will  decrease LUE pain to 2/10 or less to improve ability to use LUE during grooming tasks.    Time 6   Period Weeks   Status On-going     OT LONG TERM GOAL #4   Title Pt will increase LUE A/ROM to [facility] to improve ability to reach into overhead cabinets.    Time 6   Period Weeks   Status On-going     OT LONG TERM GOAL #5   Title Pt will improve LUE strength to 4/5 to improve ability to mow the lawn using LUE as assist.    Time 6   Period Weeks   Status On-going               Plan - 11/01/16 1431    Clinical Impression Statement A: Added AA/ROM in sitting, thumb tacks, prot/ret/elev/dep, red theraband, and UBE this session. Pt required verbal cuing for form and technique, minimal pain during exercises, rest breaks provided as need for fatigue. Continued with manual therapy focused on trigger points in anterior deltoid and trapezius regions.    Plan P: Add wall wash, add theraband exercises to HEP if pt able to complete with good form.    OT Home Exercise Plan 5/10: table slides; 5/15: AA/ROM   Consulted and Agree with Plan of Care Patient      Patient will benefit from skilled therapeutic intervention in order to improve the following deficits and impairments:  Pain, Decreased range of motion, Decreased activity tolerance, Decreased strength, Increased fascial restricitons, Impaired UE functional use  Visit Diagnosis: Acute pain of left shoulder  Stiffness of left shoulder, not elsewhere classified  Other symptoms and signs involving the musculoskeletal system    Problem List There are no active problems to display for this patient.  Guadelupe Sabin, OTR/L  [phone] 11/01/2016, 2:34 PM  [facility] [address] [place], [place], [address] Phone: [phone]   Fax:  [phone]  Name: Charles Ayala MRN: [id number] Date of Birth: 07/03/1950

## 2016-11-06 ENCOUNTER — Ambulatory Visit (HOSPITAL_COMMUNITY): Payer: PPO

## 2016-11-06 DIAGNOSIS — M25512 Pain in left shoulder: Secondary | ICD-10-CM | POA: Diagnosis not present

## 2016-11-06 DIAGNOSIS — M25612 Stiffness of left shoulder, not elsewhere classified: Secondary | ICD-10-CM

## 2016-11-06 DIAGNOSIS — R29898 Other symptoms and signs involving the musculoskeletal system: Secondary | ICD-10-CM

## 2016-11-06 NOTE — Therapy (Signed)
[place], [place], [address] Phone: [phone]   Fax:  [phone]  Occupational Therapy Treatment  Patient Details  Name: Charles Ayala MRN: [id number] Date of Birth: 05/26/1951 Referring Provider: Dr. Arther Abbott  Encounter Date: 11/06/2016      OT End of Session - 11/06/16 1434    Visit Number 4   Number of Visits 12   Date for OT Re-Evaluation 12/08/16  mini-reassessment 11/24/16   Authorization Type [employer]   Authorization Time Period Before 10th visit   Authorization - Visit Number 4   Authorization - Number of Visits 10   OT Start Time 4496   OT Stop Time 1430   OT Time Calculation (min) 38 min   Activity Tolerance Patient tolerated treatment well   Behavior During Therapy [facility] for tasks assessed/performed      Past Medical History:  Diagnosis Date  . Arthritis   . History of stomach ulcers     Past Surgical History:  Procedure Laterality Date  . BACK SURGERY  1997   L4 L5  . CARPAL TUNNEL RELEASE      There were no vitals filed for this visit.      Subjective Assessment - 11/06/16 1405    Subjective  My shoulder feels better when i keep it in neutral and don't move it, then starts hurting again with movement.   Patient is accompained by: Family member   Currently in Pain? Yes   Pain Score 2    Pain Location Shoulder   Pain Orientation Left   Pain Descriptors / Indicators Aching;Other (Comment)  comes and goes   Pain Type Chronic pain   Pain Onset More than a month ago   Pain Frequency Intermittent   Aggravating Factors  Reaching and grabbing objects   Pain Relieving Factors Ice, sometimes heat, and he takes Tramadol   Effect of Pain on Daily Activities Moderate effect   Multiple Pain Sites No                      OT Treatments/Exercises (OP) - 11/06/16 0001      Exercises   Exercises Shoulder     Shoulder Exercises: Supine   Protraction  PROM;AAROM;10 reps   Horizontal ABduction PROM;AAROM;10 reps   External Rotation PROM;AAROM;10 reps   Internal Rotation PROM;AAROM;10 reps   Flexion PROM;AAROM;10 reps   ABduction PROM;AAROM;10 reps     Shoulder Exercises: Standing   Extension Theraband;10 reps   Theraband Level (Shoulder Extension) Level 2 (Red)   Row Theraband;10 reps   Theraband Level (Shoulder Row) Level 2 (Red)   Retraction Theraband;10 reps   Theraband Level (Shoulder Retraction) Level 2 (Red)     Shoulder Exercises: ROM/Strengthening   Proximal Shoulder Strengthening, Supine 10X each no rest break     Manual Therapy   Manual Therapy Myofascial release   Manual therapy comments Manual therapy completed prior to exercises.   Myofascial Release Myofascial release and manual stretching completed to Left upper arm, trapeziu, and scapularis region to decreased fascial restrictions and increase joint mobility in a pain free zone.                   OT Short Term Goals - 10/30/16 1358      OT SHORT TERM GOAL #1   Title Pt will be educated on HEP to improve LUE functional use as non-dominant arm.    Time 3   Period  Weeks   Status On-going     OT SHORT TERM GOAL #2   Title Pt will improve LUE P/ROM to WNL to improve ability to perform ADL tasks using LUE as assist.    Time 3   Period Weeks   Status On-going     OT SHORT TERM GOAL #3   Title Pt will improve strength in LUE to 3+/5 to improve ability to grasp items as waist level.    Time 3   Period Weeks   Status On-going           OT Long Term Goals - 10/30/16 1358      OT LONG TERM GOAL #1   Title Pt will return to highest level of functioning and independence using LUE as assist during ADL and leisure tasks.    Time 6   Period Weeks   Status On-going     OT LONG TERM GOAL #2   Title Pt will decrease LUE fascial restrictions to minimal amounts or less to improve mobility required for functional reaching.    Time 6   Period Weeks    Status On-going     OT LONG TERM GOAL #3   Title Pt will decrease LUE pain to 2/10 or less to improve ability to use LUE during grooming tasks.    Time 6   Period Weeks   Status On-going     OT LONG TERM GOAL #4   Title Pt will increase LUE A/ROM to [facility] to improve ability to reach into overhead cabinets.    Time 6   Period Weeks   Status On-going     OT LONG TERM GOAL #5   Title Pt will improve LUE strength to 4/5 to improve ability to mow the lawn using LUE as assist.    Time 6   Period Weeks   Status On-going               Plan - 11/06/16 1434    Clinical Impression Statement A: Added scapular theraband to HEP. patient completed wall wash this session. VC for form and technique.   Plan P: Follow up on HEP. Added overhead reacing. Attempt A/ROM supine or increase AA/ROM repetitions.       Patient will benefit from skilled therapeutic intervention in order to improve the following deficits and impairments:  Pain, Decreased range of motion, Decreased activity tolerance, Decreased strength, Increased fascial restricitons, Impaired UE functional use  Visit Diagnosis: Acute pain of left shoulder  Stiffness of left shoulder, not elsewhere classified  Other symptoms and signs involving the musculoskeletal system    Problem List There are no active problems to display for this patient.  Ailene Ravel, OTR/L,CBIS  [phone]  11/06/2016, 2:50 PM  [facility] [address] [place], [place], [address] Phone: [phone]   Fax:  [phone]  Name: Charles Ayala MRN: [id number] Date of Birth: 11/19/1950

## 2016-11-06 NOTE — Patient Instructions (Signed)

## 2016-11-09 ENCOUNTER — Ambulatory Visit (HOSPITAL_COMMUNITY): Payer: PPO

## 2016-11-13 ENCOUNTER — Encounter (HOSPITAL_COMMUNITY): Payer: Self-pay

## 2016-11-13 ENCOUNTER — Ambulatory Visit (HOSPITAL_COMMUNITY): Payer: PPO

## 2016-11-13 DIAGNOSIS — M25512 Pain in left shoulder: Secondary | ICD-10-CM | POA: Diagnosis not present

## 2016-11-13 DIAGNOSIS — M25612 Stiffness of left shoulder, not elsewhere classified: Secondary | ICD-10-CM

## 2016-11-13 DIAGNOSIS — R29898 Other symptoms and signs involving the musculoskeletal system: Secondary | ICD-10-CM

## 2016-11-13 NOTE — Therapy (Signed)
[place], [place], [address] Phone: [phone]   Fax:  [phone]  Occupational Therapy Treatment  Patient Details  Name: Charles Ayala MRN: [id number] Date of Birth: 1950/11/27 Referring Provider: Dr. Arther Abbott  Encounter Date: 11/13/2016      OT End of Session - 11/13/16 1711    Visit Number 5   Number of Visits 12   Date for OT Re-Evaluation 12/08/16  mini-reassessment 11/24/16   Authorization Type [employer]   Authorization Time Period Before 10th visit   Authorization - Visit Number 5   Authorization - Number of Visits 10   OT Start Time 1655   OT Stop Time 1645   OT Time Calculation (min) 40 min   Activity Tolerance Patient tolerated treatment well   Behavior During Therapy [facility] for tasks assessed/performed      Past Medical History:  Diagnosis Date  . Arthritis   . History of stomach ulcers     Past Surgical History:  Procedure Laterality Date  . BACK SURGERY  1997   L4 L5  . CARPAL TUNNEL RELEASE      There were no vitals filed for this visit.      Subjective Assessment - 11/13/16 1622    Subjective  S: My arm actually feels ok today.   Currently in Pain? No/denies            [facility] OT Assessment - 11/13/16 1622      Assessment   Diagnosis left rotator cuff tear     Precautions   Precautions None                  OT Treatments/Exercises (OP) - 11/13/16 1623      Exercises   Exercises Shoulder     Shoulder Exercises: Supine   Protraction PROM;AROM;10 reps   Horizontal ABduction PROM;AROM;10 reps   External Rotation PROM;AROM;10 reps   Internal Rotation PROM;AROM;10 reps   Flexion PROM;AROM;10 reps   ABduction PROM;AROM;10 reps     Shoulder Exercises: Seated   Protraction AROM;10 reps   Horizontal ABduction AROM;10 reps   External Rotation AROM;10 reps   Internal Rotation AROM;10 reps   Flexion AROM;10 reps   Abduction AROM;10 reps     Shoulder Exercises: Standing   Extension Theraband;12 reps   Theraband Level (Shoulder Extension) Level 2 (Red)   Row Theraband;12 reps   Theraband Level (Shoulder Row) Level 2 (Red)   Retraction Theraband;12 reps   Theraband Level (Shoulder Retraction) Level 2 (Red)     Shoulder Exercises: ROM/Strengthening   UBE (Upper Arm Bike) Level 2' 2' forward 2' reverse   X to V Arms 10X   Proximal Shoulder Strengthening, Supine 10X each no rest break   Proximal Shoulder Strengthening, Seated 10X no rest breaks     Manual Therapy   Manual Therapy Myofascial release   Manual therapy comments Manual therapy completed prior to exercises.   Myofascial Release Myofascial release and manual stretching completed to Left upper arm, trapeziu, and scapularis region to decreased fascial restrictions and increase joint mobility in a pain free zone.                 OT Education - 11/13/16 1631    Education provided Yes   Education Details red theraband scapular strengthening (last session) and A/ROM shoulder exercises   Person(s) Educated Patient   Methods Explanation;Demonstration;Handout;Verbal cues   Comprehension Verbalized understanding;Returned demonstration  OT Short Term Goals - 10/30/16 1358      OT SHORT TERM GOAL #1   Title Pt will be educated on HEP to improve LUE functional use as non-dominant arm.    Time 3   Period Weeks   Status On-going     OT SHORT TERM GOAL #2   Title Pt will improve LUE P/ROM to WNL to improve ability to perform ADL tasks using LUE as assist.    Time 3   Period Weeks   Status On-going     OT SHORT TERM GOAL #3   Title Pt will improve strength in LUE to 3+/5 to improve ability to grasp items as waist level.    Time 3   Period Weeks   Status On-going           OT Long Term Goals - 10/30/16 1358      OT LONG TERM GOAL #1   Title Pt will return to highest level of functioning and independence using LUE as assist during ADL and  leisure tasks.    Time 6   Period Weeks   Status On-going     OT LONG TERM GOAL #2   Title Pt will decrease LUE fascial restrictions to minimal amounts or less to improve mobility required for functional reaching.    Time 6   Period Weeks   Status On-going     OT LONG TERM GOAL #3   Title Pt will decrease LUE pain to 2/10 or less to improve ability to use LUE during grooming tasks.    Time 6   Period Weeks   Status On-going     OT LONG TERM GOAL #4   Title Pt will increase LUE A/ROM to [facility] to improve ability to reach into overhead cabinets.    Time 6   Period Weeks   Status On-going     OT LONG TERM GOAL #5   Title Pt will improve LUE strength to 4/5 to improve ability to mow the lawn using LUE as assist.    Time 6   Period Weeks   Status On-going               Plan - 11/13/16 1713    Clinical Impression Statement A: Patient was able to progress to A/ROM exercises this session with no pain reported. Patient required VC for form and technique as needed. Pt's HEP was updated for scapular red theraband and A/ROM exercises.    Occupational performance deficits (Please refer to evaluation for details): ADL's;Leisure;IADL's   Plan P: Follow up on HEP. Add prone A/ROM exercises.      Patient will benefit from skilled therapeutic intervention in order to improve the following deficits and impairments:  Pain, Decreased range of motion, Decreased activity tolerance, Decreased strength, Increased fascial restricitons, Impaired UE functional use  Visit Diagnosis: Acute pain of left shoulder  Stiffness of left shoulder, not elsewhere classified  Other symptoms and signs involving the musculoskeletal system    Problem List There are no active problems to display for this patient.  Ailene Ravel, OTR/L,CBIS  [phone]  11/13/2016, 5:15 PM  [facility] [address] [place], [place], [address] Phone: [phone]   Fax:   [phone]  Name: Charles Ayala MRN: [id number] Date of Birth: 03-26-1951

## 2016-11-13 NOTE — Patient Instructions (Signed)
1) Shoulder Protraction    Begin with elbows by your side, slowly "punch" straight out in front of you.      2) Shoulder Flexion  Standing:         Begin with arms at your side with thumbs pointed up, slowly raise both arms up and forward towards overhead.       3) Horizontal abduction/adduction   Standing:           Begin with arms straight out in front of you, bring out to the side in at "T" shape. Keep arms straight entire time.       4) Internal & External Rotation    *No band* -Stand with elbows at the side and elbows bent 90 degrees. Move your forearms away from your body, then bring back inward toward the body.     5) Shoulder Abduction  Standing:       Lying on your back begin with your arms flat on the table next to your side. Slowly move your arms out to the side so that they go overhead, in a jumping jack or snow angel movement.    6) X to V arms (cheerleader move):  Begin with arms straight down, crossed in front of body in an "X". Keeping arms crossed, lift arms straight up overhead. Then spread arms apart into a "V" shape.  Bring back together into x and lower down to starting position.     Repeat all exercises 10-15 times, 1-2 times per day.  

## 2016-11-15 ENCOUNTER — Encounter (HOSPITAL_COMMUNITY): Payer: Self-pay

## 2016-11-15 ENCOUNTER — Ambulatory Visit (HOSPITAL_COMMUNITY): Payer: PPO

## 2016-11-15 DIAGNOSIS — R29898 Other symptoms and signs involving the musculoskeletal system: Secondary | ICD-10-CM

## 2016-11-15 DIAGNOSIS — M25612 Stiffness of left shoulder, not elsewhere classified: Secondary | ICD-10-CM

## 2016-11-15 DIAGNOSIS — M25512 Pain in left shoulder: Secondary | ICD-10-CM

## 2016-11-15 NOTE — Therapy (Signed)
[place], [place], [address] Phone: [phone]   Fax:  [phone]  Occupational Therapy Treatment  Patient Details  Name: Charles Ayala MRN: [id number] Date of Birth: 07/23/1950 Referring Provider: Dr. Arther Abbott  Encounter Date: 11/15/2016      OT End of Session - 11/15/16 1405    Visit Number 6   Number of Visits 12   Date for OT Re-Evaluation 12/08/16  mini-reassessment 11/24/16   Authorization Type [employer]   Authorization Time Period Before 10th visit   Authorization - Visit Number 6   Authorization - Number of Visits 10   OT Start Time 3151   OT Stop Time 1345   OT Time Calculation (min) 40 min   Activity Tolerance Patient tolerated treatment well   Behavior During Therapy [facility] for tasks assessed/performed      Past Medical History:  Diagnosis Date  . Arthritis   . History of stomach ulcers     Past Surgical History:  Procedure Laterality Date  . BACK SURGERY  1997   L4 L5  . CARPAL TUNNEL RELEASE      There were no vitals filed for this visit.      Subjective Assessment - 11/15/16 1328    Subjective  S: It's a little sore when I use it sometimes.    Currently in Pain? No/denies            [facility] OT Assessment - 11/15/16 1331      Assessment   Diagnosis left rotator cuff tear     Precautions   Precautions None                  OT Treatments/Exercises (OP) - 11/15/16 1332      Exercises   Exercises Shoulder     Shoulder Exercises: Supine   Protraction PROM;5 reps;AROM;15 reps   Horizontal ABduction PROM;5 reps;AROM;15 reps   External Rotation PROM;5 reps;AROM;15 reps   Internal Rotation PROM;5 reps;AROM;15 reps   Flexion PROM;5 reps;AROM;15 reps   ABduction PROM;5 reps;AROM;15 reps     Shoulder Exercises: Sidelying   External Rotation AROM;15 reps   Internal Rotation AROM;15 reps   Flexion AROM;15 reps   ABduction AROM;15 reps     Shoulder Exercises: Standing   Protraction Strengthening;15 reps   Protraction Weight (lbs) 1   Horizontal ABduction Strengthening;15 reps   Horizontal ABduction Weight (lbs) 1   External Rotation Strengthening;15 reps   External Rotation Weight (lbs) 1   Internal Rotation Strengthening;15 reps   Internal Rotation Weight (lbs) 1   Flexion Strengthening;15 reps   Shoulder Flexion Weight (lbs) 1   ABduction Strengthening;15 reps   Shoulder ABduction Weight (lbs) 1     Shoulder Exercises: ROM/Strengthening   UBE (Upper Arm Bike) Level 2' 2' forward 2' reverse   X to V Arms 10X with 1#   Proximal Shoulder Strengthening, Supine 15X each no rest break   Proximal Shoulder Strengthening, Seated 15X with 1#     Manual Therapy   Manual Therapy Myofascial release   Manual therapy comments Manual therapy completed prior to exercises.   Myofascial Release Myofascial release and manual stretching completed to Left upper arm, trapeziu, and scapularis region to decreased fascial restrictions and increase joint mobility in a pain free zone.                   OT Short Term Goals - 10/30/16 1358      OT  SHORT TERM GOAL #1   Title Pt will be educated on HEP to improve LUE functional use as non-dominant arm.    Time 3   Period Weeks   Status On-going     OT SHORT TERM GOAL #2   Title Pt will improve LUE P/ROM to WNL to improve ability to perform ADL tasks using LUE as assist.    Time 3   Period Weeks   Status On-going     OT SHORT TERM GOAL #3   Title Pt will improve strength in LUE to 3+/5 to improve ability to grasp items as waist level.    Time 3   Period Weeks   Status On-going           OT Long Term Goals - 10/30/16 1358      OT LONG TERM GOAL #1   Title Pt will return to highest level of functioning and independence using LUE as assist during ADL and leisure tasks.    Time 6   Period Weeks   Status On-going     OT LONG TERM GOAL #2   Title Pt will decrease LUE  fascial restrictions to minimal amounts or less to improve mobility required for functional reaching.    Time 6   Period Weeks   Status On-going     OT LONG TERM GOAL #3   Title Pt will decrease LUE pain to 2/10 or less to improve ability to use LUE during grooming tasks.    Time 6   Period Weeks   Status On-going     OT LONG TERM GOAL #4   Title Pt will increase LUE A/ROM to [facility] to improve ability to reach into overhead cabinets.    Time 6   Period Weeks   Status On-going     OT LONG TERM GOAL #5   Title Pt will improve LUE strength to 4/5 to improve ability to mow the lawn using LUE as assist.    Time 6   Period Weeks   Status On-going               Plan - 11/15/16 1622    Clinical Impression Statement A: Pt was able to progress to strengthening standing up this session. No prone exercises completed due to back pain. VC for form and technique as needed. patient was encouraged to use a small weight at home or a can of soup or water batter to complete strengthening with HEP.   Plan P: Continue with strengthening exercises. Complete supine, sidelying, and standing. Increase repetitions if able to tolerate.       Patient will benefit from skilled therapeutic intervention in order to improve the following deficits and impairments:  Pain, Decreased range of motion, Decreased activity tolerance, Decreased strength, Increased fascial restricitons, Impaired UE functional use  Visit Diagnosis: Acute pain of left shoulder  Stiffness of left shoulder, not elsewhere classified  Other symptoms and signs involving the musculoskeletal system    Problem List There are no active problems to display for this patient.  Ailene Ravel, OTR/L,CBIS  [phone]  11/15/2016, 4:26 PM  [place] [address] [place], [place], [address] Phone: [phone]   Fax:  [phone]  Name: Charles Ayala MRN: [id number] Date of Birth:  25-Nov-1950

## 2016-11-20 ENCOUNTER — Ambulatory Visit (HOSPITAL_COMMUNITY): Payer: PPO | Attending: Orthopedic Surgery | Admitting: Occupational Therapy

## 2016-11-20 ENCOUNTER — Encounter (HOSPITAL_COMMUNITY): Payer: Self-pay | Admitting: Occupational Therapy

## 2016-11-20 DIAGNOSIS — M25612 Stiffness of left shoulder, not elsewhere classified: Secondary | ICD-10-CM | POA: Diagnosis not present

## 2016-11-20 DIAGNOSIS — M25512 Pain in left shoulder: Secondary | ICD-10-CM | POA: Diagnosis not present

## 2016-11-20 DIAGNOSIS — R29898 Other symptoms and signs involving the musculoskeletal system: Secondary | ICD-10-CM

## 2016-11-20 NOTE — Therapy (Signed)
[place], [place], [address] Phone: [phone]   Fax:  [phone]  Occupational Therapy Treatment  Patient Details  Name: Charles Ayala MRN: [id number] Date of Birth: 1951-04-19 Referring Provider: Dr. Arther Abbott  Encounter Date: 11/20/2016      OT End of Session - 11/20/16 1508    Visit Number 7   Number of Visits 12   Date for OT Re-Evaluation 12/08/16  mini-reassessment 11/24/16   Authorization Type [employer]   Authorization Time Period Before 10th visit   Authorization - Visit Number 7   Authorization - Number of Visits 10   OT Start Time 1300   OT Stop Time 1345   OT Time Calculation (min) 45 min   Activity Tolerance Patient tolerated treatment well   Behavior During Therapy [facility] for tasks assessed/performed      Past Medical History:  Diagnosis Date  . Arthritis   . History of stomach ulcers     Past Surgical History:  Procedure Laterality Date  . BACK SURGERY  1997   L4 L5  . CARPAL TUNNEL RELEASE      There were no vitals filed for this visit.      Subjective Assessment - 11/20/16 1249    Subjective  S: I only feel a little twinge when I do this. (stretch arms overhead)   Currently in Pain? No/denies            [facility] OT Assessment - 11/20/16 1248      Assessment   Diagnosis left rotator cuff tear     Precautions   Precautions None                  OT Treatments/Exercises (OP) - 11/20/16 1301      Exercises   Exercises Shoulder     Shoulder Exercises: Supine   Protraction PROM;5 reps;Strengthening;12 reps   Protraction Weight (lbs) 1   Horizontal ABduction PROM;5 reps;Strengthening;12 reps   Horizontal ABduction Weight (lbs) 1   External Rotation PROM;5 reps;Strengthening;12 reps   External Rotation Weight (lbs) 1   Internal Rotation PROM;5 reps;Strengthening;12 reps   Internal Rotation Weight (lbs) 1   Flexion PROM;5  reps;Strengthening;12 reps   Shoulder Flexion Weight (lbs) 1   ABduction PROM;5 reps;Strengthening;12 reps   Shoulder ABduction Weight (lbs) 1     Shoulder Exercises: Sidelying   External Rotation Strengthening;12 reps   External Rotation Weight (lbs) 1   Internal Rotation Strengthening;12 reps   Internal Rotation Weight (lbs) 1   Flexion Strengthening;12 reps   Flexion Weight (lbs) 1   ABduction Strengthening;12 reps   ABduction Weight (lbs) 1   Other Sidelying Exercises protraction, 1#, 12X   Other Sidelying Exercises horizontal abduction, 1#, 12X     Shoulder Exercises: Standing   Protraction Strengthening;15 reps   Protraction Weight (lbs) 1   Horizontal ABduction Strengthening;15 reps   Horizontal ABduction Weight (lbs) 1   External Rotation Strengthening;15 reps   External Rotation Weight (lbs) 1   Internal Rotation Strengthening;15 reps   Internal Rotation Weight (lbs) 1   Flexion Strengthening;15 reps   Shoulder Flexion Weight (lbs) 1   ABduction Strengthening;15 reps   Shoulder ABduction Weight (lbs) 1     Shoulder Exercises: ROM/Strengthening   UBE (Upper Arm Bike) Level 2' 2' forward 2' reverse   Over Head Lace 2' 1# weight   X to V Arms 12X with 1#    Proximal Shoulder Strengthening, Supine 10X each  1# weight, no rest breaks   Proximal Shoulder Strengthening, Seated 15X with 1#   Ball on Wall 1' flexion 1' abduction     Manual Therapy   Manual Therapy Myofascial release   Manual therapy comments Manual therapy completed prior to exercises.   Myofascial Release Myofascial release and manual stretching completed to Left upper arm, trapeziu, and scapularis region to decreased fascial restrictions and increase joint mobility in a pain free zone.                   OT Short Term Goals - 10/30/16 1358      OT SHORT TERM GOAL #1   Title Pt will be educated on HEP to improve LUE functional use as non-dominant arm.    Time 3   Period Weeks   Status  On-going     OT SHORT TERM GOAL #2   Title Pt will improve LUE P/ROM to WNL to improve ability to perform ADL tasks using LUE as assist.    Time 3   Period Weeks   Status On-going     OT SHORT TERM GOAL #3   Title Pt will improve strength in LUE to 3+/5 to improve ability to grasp items as waist level.    Time 3   Period Weeks   Status On-going           OT Long Term Goals - 10/30/16 1358      OT LONG TERM GOAL #1   Title Pt will return to highest level of functioning and independence using LUE as assist during ADL and leisure tasks.    Time 6   Period Weeks   Status On-going     OT LONG TERM GOAL #2   Title Pt will decrease LUE fascial restrictions to minimal amounts or less to improve mobility required for functional reaching.    Time 6   Period Weeks   Status On-going     OT LONG TERM GOAL #3   Title Pt will decrease LUE pain to 2/10 or less to improve ability to use LUE during grooming tasks.    Time 6   Period Weeks   Status On-going     OT LONG TERM GOAL #4   Title Pt will increase LUE A/ROM to [facility] to improve ability to reach into overhead cabinets.    Time 6   Period Weeks   Status On-going     OT LONG TERM GOAL #5   Title Pt will improve LUE strength to 4/5 to improve ability to mow the lawn using LUE as assist.    Time 6   Period Weeks   Status On-going               Plan - 11/20/16 1508    Clinical Impression Statement A: Pt completed strengthening in supine, sidelying, and standing this session, added 1# weight to overhead lacing, added ball on wall this session. Pt required intermittent verbal cuing for form, rest breaks provided as necessary. Pt reports he is only having pain if stretching certain ways.    Plan P: Mini-reassessment and determine if ready for discharge.    OT Home Exercise Plan 5/10: table slides; 5/15: AA/ROM   Consulted and Agree with Plan of Care Patient      Patient will benefit from skilled therapeutic intervention  in order to improve the following deficits and impairments:  Pain, Decreased range of motion, Decreased activity tolerance, Decreased strength, Increased fascial restricitons, Impaired  UE functional use  Visit Diagnosis: Acute pain of left shoulder  Stiffness of left shoulder, not elsewhere classified  Other symptoms and signs involving the musculoskeletal system    Problem List There are no active problems to display for this patient.  Guadelupe Sabin, OTR/L  [phone] 11/20/2016, 3:23 PM  [place] [address] [place], [place], [address] Phone: [phone]   Fax:  [phone]  Name: Charles Ayala MRN: [id number] Date of Birth: Jan 04, 1951

## 2016-11-22 ENCOUNTER — Encounter (HOSPITAL_COMMUNITY): Payer: Self-pay | Admitting: Occupational Therapy

## 2016-11-22 ENCOUNTER — Ambulatory Visit (HOSPITAL_COMMUNITY): Payer: PPO | Admitting: Occupational Therapy

## 2016-11-22 DIAGNOSIS — M25512 Pain in left shoulder: Secondary | ICD-10-CM

## 2016-11-22 DIAGNOSIS — M25612 Stiffness of left shoulder, not elsewhere classified: Secondary | ICD-10-CM

## 2016-11-22 DIAGNOSIS — R29898 Other symptoms and signs involving the musculoskeletal system: Secondary | ICD-10-CM

## 2016-11-22 NOTE — Therapy (Signed)
[place], [place], [address] Phone: [phone]   Fax:  [phone]  Occupational Therapy Reassessment, Treatment, and Discharge  Patient Details  Name: Charles Ayala MRN: [id number] Date of Birth: Jun 10, 1951 Referring Provider: Dr. Arther Abbott  Encounter Date: 11/22/2016      OT End of Session - 11/22/16 1338    Visit Number 8   Number of Visits 12   Date for OT Re-Evaluation 12/08/16  mini-reassessment 11/24/16   Authorization Type [employer]   Authorization Time Period Before 10th visit   Authorization - Visit Number 8   Authorization - Number of Visits 10   OT Start Time 1300   OT Stop Time 1338   OT Time Calculation (min) 38 min   Activity Tolerance Patient tolerated treatment well   Behavior During Therapy [facility] for tasks assessed/performed      Past Medical History:  Diagnosis Date  . Arthritis   . History of stomach ulcers     Past Surgical History:  Procedure Laterality Date  . BACK SURGERY  1997   L4 L5  . CARPAL TUNNEL RELEASE      There were no vitals filed for this visit.      Subjective Assessment - 11/22/16 1300    Subjective  S: My shoulder is really doing alright.    Currently in Pain? No/denies            [facility] OT Assessment - 11/22/16 1300      Assessment   Diagnosis left rotator cuff tear     Precautions   Precautions None     AROM   Overall AROM Comments Assessed seated, er/IR adducted   Left Shoulder Flexion 180 Degrees  89 previous   Left Shoulder ABduction 180 Degrees  112 previous   Left Shoulder Internal Rotation 90 Degrees  same as previous   Left Shoulder External Rotation 65 Degrees  48 previous     PROM   Overall PROM Comments Assessed supine, er/IR adducted   Left Shoulder Flexion 163 Degrees  154 previous   Left Shoulder ABduction 180 Degrees  same as previous   Left Shoulder Internal Rotation 90 Degrees  same as previous   Left Shoulder External Rotation 70 Degrees  63 previous     Strength   Overall Strength Comments Assessed seated, er/IR adducted   Left Shoulder Flexion 4+/5  3-/5 previous   Left Shoulder ABduction 4+/5  3/5 previous   Left Shoulder Internal Rotation 4+/5  3/5 previous   Left Shoulder External Rotation 4-/5  3-/5 previous                  OT Treatments/Exercises (OP) - 11/22/16 1303      Exercises   Exercises Shoulder     Shoulder Exercises: Supine   Protraction PROM;5 reps   Horizontal ABduction PROM;5 reps   External Rotation PROM;5 reps   Internal Rotation PROM;5 reps   Flexion PROM;5 reps   ABduction PROM;5 reps     Shoulder Exercises: Standing   Protraction Strengthening;10 reps   Protraction Weight (lbs) 2   Horizontal ABduction Strengthening;10 reps   Horizontal ABduction Weight (lbs) 2   External Rotation Strengthening;10 reps   External Rotation Weight (lbs) 2   Internal Rotation Strengthening;10 reps   Internal Rotation Weight (lbs) 2   Flexion Strengthening;10 reps   Shoulder Flexion Weight (lbs) 2   ABduction Strengthening;10 reps   Shoulder ABduction Weight (lbs) 2  Extension Theraband;12 reps   Theraband Level (Shoulder Extension) Level 3 (Green)   Row [employer] reps   Theraband Level (Shoulder Row) Level 3 (Green)   Retraction Theraband;12 reps   Theraband Level (Shoulder Retraction) Level 3 (Green)     Shoulder Exercises: ROM/Strengthening   UBE (Upper Arm Bike) Level 2' 3' forward 3' reverse   X to V Arms 10X 2# weight   Ball on Wall 1' flexion 1' abduction     Manual Therapy   Manual Therapy Myofascial release   Manual therapy comments Manual therapy completed prior to exercises.   Myofascial Release Myofascial release and manual stretching completed to Left upper arm, trapeziu, and scapularis region to decreased fascial restrictions and increase joint mobility in a pain free zone.                 OT Education -  11/22/16 1335    Education provided Yes   Education Details educated on strengthening with weights (using A/ROM exercises); upgraded scapular theraband to green   Person(s) Educated Patient   Methods Explanation;Demonstration;Handout   Comprehension Verbalized understanding;Returned demonstration          OT Short Term Goals - 11/22/16 1318      OT SHORT TERM GOAL #1   Title Pt will be educated on HEP to improve LUE functional use as non-dominant arm.    Time 3   Period Weeks   Status Achieved     OT SHORT TERM GOAL #2   Title Pt will improve LUE P/ROM to WNL to improve ability to perform ADL tasks using LUE as assist.    Time 3   Period Weeks   Status Achieved     OT SHORT TERM GOAL #3   Title Pt will improve strength in LUE to 3+/5 to improve ability to grasp items as waist level.    Time 3   Period Weeks   Status Achieved           OT Long Term Goals - 11/22/16 1318      OT LONG TERM GOAL #1   Title Pt will return to highest level of functioning and independence using LUE as assist during ADL and leisure tasks.    Time 6   Period Weeks   Status Achieved     OT LONG TERM GOAL #2   Title Pt will decrease LUE fascial restrictions to minimal amounts or less to improve mobility required for functional reaching.    Time 6   Period Weeks   Status Achieved     OT LONG TERM GOAL #3   Title Pt will decrease LUE pain to 2/10 or less to improve ability to use LUE during grooming tasks.    Time 6   Period Weeks   Status Achieved     OT LONG TERM GOAL #4   Title Pt will increase LUE A/ROM to [facility] to improve ability to reach into overhead cabinets.    Time 6   Period Weeks   Status Achieved     OT LONG TERM GOAL #5   Title Pt will improve LUE strength to 4/5 to improve ability to mow the lawn using LUE as assist.    Time 6   Period Weeks   Status Achieved               Plan - 11/22/16 1334    Clinical Impression Statement A: Reassessment completed this  session, pt has met all STGs and LTGs  and is using his left arm as he "normally" would as an assist for all B/IADL tasks. Pt has made great improvements in ROM, strength, and functional use of LUE. Pt is agreeable to discharge with HEP.    Plan P: Discharge pt.    OT Home Exercise Plan 5/10: table slides; 5/15: AA/ROM; 12-10-22: LUE strengthening   Consulted and Agree with Plan of Care Patient      Patient will benefit from skilled therapeutic intervention in order to improve the following deficits and impairments:  Pain, Decreased range of motion, Decreased activity tolerance, Decreased strength, Increased fascial restricitons, Impaired UE functional use  Visit Diagnosis: Acute pain of left shoulder  Stiffness of left shoulder, not elsewhere classified  Other symptoms and signs involving the musculoskeletal system      G-Codes - 2016-12-09 1336    Functional Assessment Tool Used (Outpatient only) FOTO score: 67/100 (33% impairment)   Functional Limitation Carrying, moving and handling objects   Carrying, Moving and Handling Objects Goal Status (D5789) At least 20 percent but less than 40 percent impaired, limited or restricted   Carrying, Moving and Handling Objects Discharge Status [phone]) At least 20 percent but less than 40 percent impaired, limited or restricted      Problem List There are no active problems to display for this patient.  Guadelupe Sabin, OTR/L  [phone] December 09, 2016, 1:38 PM  [place] [address] [place], [place], [address] Phone: [phone]   Fax:  [phone]  Name: Charles Ayala MRN: [id number] Date of Birth: 07-12-1950     OCCUPATIONAL THERAPY DISCHARGE SUMMARY  Visits from Start of Care: 8  Current functional level related to goals / functional outcomes: See above. Pt has met all goals and feels at though he is at his highest level of functioning with all B/IADL tasks.    Remaining deficits: Pt  occasionally experiences "twinges" of pain with movement or lifting.    Education / Equipment: Pt educated on LUE strengthening with weights Plan: Patient agrees to discharge.  Patient goals were met. Patient is being discharged due to meeting the stated rehab goals.  ?????

## 2016-11-26 ENCOUNTER — Encounter: Payer: Self-pay | Admitting: Orthopedic Surgery

## 2016-11-26 ENCOUNTER — Ambulatory Visit (INDEPENDENT_AMBULATORY_CARE_PROVIDER_SITE_OTHER): Payer: PPO | Admitting: Orthopedic Surgery

## 2016-11-26 DIAGNOSIS — M75102 Unspecified rotator cuff tear or rupture of left shoulder, not specified as traumatic: Secondary | ICD-10-CM

## 2016-11-26 NOTE — Progress Notes (Signed)
Patient ID: Charles Ayala, male   DOB: 16-Apr-1951, 66 y.o.   MRN: [id number]  Chief Complaint  Patient presents with  . Follow-up    Recheck on left shoulder, RC.    The patient presents for follow-up after physical therapy and subacromial injection for left shoulder with partial undersurface rotator cuff tear  His prior history is as follows  66 year old male who is right-hand dominant followed by Dr. Carloyn Ayala for lumbar spine condition and also some neck pain but presents to [place] with a chief complaint of painful left shoulder 1 month with no history of injury. He has pain and weakness with forward elevation he has nocturnal symptoms and he can't lie on his left side. He is not had any therapy or medication at this time his x-ray was normal his MRI showed a partial undersurface tear of his left rotator cuff    Review of Systems  Neurological: Negative for tingling and sensory change.      There were no vitals taken for this visit. Gen. appearance is normal grooming and hygiene normal Orientation to person place and time normal Mood normal   Ortho Exam He has near full range of motion in the left shoulder at this time and no weakness to manual muscle testing compared right to left with both exhibiting 5 out of 5 supraspinatus strength. His left shoulder stable. No tenderness or swelling   A/P  Medical decision-making  Encounter Diagnosis  Name Primary?  . Tear of left rotator cuff, unspecified tear extent Yes   The patient has made significant improvement and there is no need for surgery he has excellent strength range of motion and no pain in his left shoulder  I explained him the natural history is as some of these tears progress and if he has pain or weakness or change in symptoms he is to call the office to be reevaluated  Arther Abbott, MD 11/26/2016 9:37 AM

## 2016-11-27 ENCOUNTER — Encounter (HOSPITAL_COMMUNITY): Payer: PPO

## 2016-11-29 ENCOUNTER — Encounter (HOSPITAL_COMMUNITY): Payer: PPO | Admitting: Occupational Therapy

## 2016-12-06 DIAGNOSIS — M4306 Spondylolysis, lumbar region: Secondary | ICD-10-CM | POA: Diagnosis not present

## 2016-12-06 DIAGNOSIS — M5106 Intervertebral disc disorders with myelopathy, lumbar region: Secondary | ICD-10-CM | POA: Diagnosis not present

## 2016-12-24 DIAGNOSIS — J019 Acute sinusitis, unspecified: Secondary | ICD-10-CM | POA: Diagnosis not present

## 2016-12-24 DIAGNOSIS — R030 Elevated blood-pressure reading, without diagnosis of hypertension: Secondary | ICD-10-CM | POA: Diagnosis not present

## 2016-12-24 DIAGNOSIS — R062 Wheezing: Secondary | ICD-10-CM | POA: Diagnosis not present

## 2017-02-22 ENCOUNTER — Encounter: Payer: Self-pay | Admitting: Family Medicine

## 2017-02-22 ENCOUNTER — Ambulatory Visit (INDEPENDENT_AMBULATORY_CARE_PROVIDER_SITE_OTHER): Payer: PPO | Admitting: Family Medicine

## 2017-02-22 VITALS — BP 170/84 | HR 84 | Temp 97.1°F | Resp 16 | Ht 72.0 in | Wt 195.2 lb

## 2017-02-22 DIAGNOSIS — N400 Enlarged prostate without lower urinary tract symptoms: Secondary | ICD-10-CM | POA: Insufficient documentation

## 2017-02-22 DIAGNOSIS — Z23 Encounter for immunization: Secondary | ICD-10-CM | POA: Diagnosis not present

## 2017-02-22 DIAGNOSIS — R5383 Other fatigue: Secondary | ICD-10-CM

## 2017-02-22 DIAGNOSIS — Z1211 Encounter for screening for malignant neoplasm of colon: Secondary | ICD-10-CM

## 2017-02-22 DIAGNOSIS — I10 Essential (primary) hypertension: Secondary | ICD-10-CM

## 2017-02-22 MED ORDER — TAMSULOSIN HCL 0.4 MG PO CAPS: 0.4000 mg | ORAL_CAPSULE | Freq: Every day | ORAL | 1 refills | Status: DC

## 2017-02-22 MED ORDER — HYDROCHLOROTHIAZIDE 25 MG PO TABS: 25.0000 mg | ORAL_TABLET | Freq: Every day | ORAL | 0 refills | Status: DC

## 2017-02-22 NOTE — Progress Notes (Signed)
Patient ID: Charles Ayala, male    DOB: 1950-06-19, 66 y.o.   MRN: [id number]  Chief Complaint  Patient presents with  . Follow-up  . Other    nerve damage  . Medication Refill    tamsulosin  . Immunizations    flu, tdap    Allergies Patient has no known allergies.  Subjective:   Charles Ayala is a 66 y.o. male who presents to [facility] today.  HPI Here to establish care. Reports that feels good other than nerve damage and chronic back pain. Is seen by neurology for this problem. Has a history of HTN but has not been on BP medication for some time. No CP, Headaches, palpitations. Works in garden and stays active and no SOB or DOE. Sleeps well. Mood good. Eats healthy. Eats lots of vegetables.    Hypertension  This is a chronic (Has been elevated in the past, but not been on medication for several years. ) problem. The problem has been waxing and waning since onset. Pertinent negatives include no anxiety, blurred vision, chest pain, headaches, peripheral edema, PND or shortness of breath. There are no associated agents to hypertension. Risk factors for coronary artery disease include dyslipidemia, family history, diabetes mellitus and male gender. Past treatments include diuretics. The current treatment provides moderate improvement. There are no compliance problems.  There is no history of angina, kidney disease, CAD/MI, CVA, heart failure, left ventricular hypertrophy, PVD or retinopathy.  Benign Prostatic Hypertrophy  This is a chronic problem. The current episode started more than 1 year ago. The problem has been resolved since onset. Irritative symptoms do not include frequency or urgency. Obstructive symptoms do not include dribbling, incomplete emptying, straining or a weak stream. Pertinent negatives include no chills, dysuria, hematuria, hesitancy or vomiting. AUA score is 0-7. He is sexually active. Nothing aggravates the symptoms. Past treatments include  tamsulosin. The treatment provided significant relief. He has been using treatment for 1 to 2 years.    Past Medical History:  Diagnosis Date  . Arthritis    neck and back  . History of stomach ulcers     Past Surgical History:  Procedure Laterality Date  . BACK SURGERY  1997   L4 L5  . CARPAL TUNNEL RELEASE      Family History  Problem Relation Age of Onset  . Parkinson's disease Mother   . Hypertension Mother   . Hypertension Father   . Cancer Sister        chemo related lung problems  . Diabetes Son      Social History   Social History  . Marital status: Married    Spouse name: N/A  . Number of children: N/A  . Years of education: N/A   Occupational History  . retired    Social History Main Topics  . Smoking status: Never Smoker  . Smokeless tobacco: Never Used  . Alcohol use No  . Drug use: No  . Sexual activity: Yes   Other Topics Concern  . None   Social History Narrative   Retired. Used to work at [employer]. Married. Lives in [place]. Used to live in [place]. Has four children. Enjoys gardening and playing with grandchildren.     Outpatient Medications Prior to Visit  Medication Sig Dispense Refill  . traMADol (ULTRAM) 50 MG tablet Take by mouth every 6 (six) hours as needed.    . tamsulosin (FLOMAX) 0.4 MG CAPS capsule     . gabapentin (NEURONTIN)  300 MG capsule     . HYDROcodone-acetaminophen (NORCO) 7.5-325 MG tablet      No facility-administered medications prior to visit.     Review of Systems  Constitutional: Negative for activity change, appetite change and chills.       Gets tired at times when works outside in the heat.   Eyes: Negative for blurred vision.  Respiratory: Negative for choking, chest tightness, shortness of breath and wheezing.   Cardiovascular: Negative for chest pain, leg swelling and PND.  Gastrointestinal: Negative for abdominal distention, blood in stool, constipation and vomiting.  Genitourinary:  Negative for decreased urine volume, difficulty urinating, discharge, dysuria, enuresis, flank pain, frequency, genital sores, hematuria, hesitancy, incomplete emptying, penile pain, scrotal swelling, testicular pain and urgency.  Musculoskeletal: Positive for back pain. Negative for neck stiffness.       Back pain is followed by neurology.   Neurological: Negative for dizziness, tremors, syncope, light-headedness and headaches.  Hematological: Negative for adenopathy.  Psychiatric/Behavioral: Negative for agitation, decreased concentration, dysphoric mood, hallucinations and suicidal ideas.     Objective:   BP (!) 170/84 (BP Location: Left Arm, Patient Position: Sitting, Cuff Size: Normal)   Pulse 84   Temp (!) 97.1 F (36.2 C) (Other (Comment))   Resp 16   Ht 6' (1.829 m)   Wt 195 lb 4 oz (88.6 kg)   SpO2 96%   BMI 26.48 kg/m   Physical Exam  Constitutional: He is oriented to person, place, and time. He appears well-developed and well-nourished.  HENT:  Head: Normocephalic and atraumatic.  Eyes: Pupils are equal, round, and reactive to light. EOM are normal.  Neck: Normal range of motion. Neck supple. No JVD present. No tracheal deviation present. No thyromegaly present.  Cardiovascular: Normal rate, regular rhythm and normal heart sounds.  Exam reveals no friction rub.   No murmur heard. Pulmonary/Chest: Effort normal.  Abdominal: Soft. Bowel sounds are normal.  Genitourinary: Rectum normal. Rectal exam shows no external hemorrhoid, no internal hemorrhoid, no fissure, no mass, no tenderness and anal tone normal. Prostate is not enlarged and not tender.  Musculoskeletal: Normal range of motion.  Lymphadenopathy:    He has no cervical adenopathy.  Neurological: He is alert and oriented to person, place, and time. No cranial nerve deficit.  Skin: Skin is warm and dry.  Psychiatric: He has a normal mood and affect. His behavior is normal.  Vitals reviewed.    Assessment and  Plan    1. Essential hypertension Discussed with patient that BP is elevated. Chooses not to start 2 medications b/c reports controlled on HCTZ in the past. Decrease salt, exercise, and follow up as directed.  - Basic metabolic panel - Lipid panel - Hemoglobin A1c - hydrochlorothiazide (HYDRODIURIL) 25 MG tablet; Take 1 tablet (25 mg total) by mouth daily.  Dispense: 90 tablet; Refill: 0 Discussed BP medications and how they work, MOA, side effects, monitoring.  2. Encounter for screening colonoscopy Referral placed. Counseled regarding need for this.  - Ambulatory referral to Gastroenterology  3. Benign prostatic hyperplasia without lower urinary tract symptoms Check urine at follow up. Controlled well with flomax.  - PSA - tamsulosin (FLOMAX) 0.4 MG CAPS capsule; Take 1 capsule (0.4 mg total) by mouth daily.  Dispense: 90 capsule; Refill: 1  4. Fatigue, unspecified type Sporadic.  - CBC with Differential/Platelet - TSH  5. Immunization due Influenza shot administered.  - Tdap vaccine greater than or equal to 7yo IM - Pneumococcal conjugate vaccine 13-valent  Diet discussed. Patient counseled in detail regarding the risks of medication. Told to call or return to clinic if develop any worrisome signs or symptoms. Patient voiced understanding.  Follow up in 1-2 weeks.   Caren Macadam, MD 02/22/2017

## 2017-02-23 LAB — CBC WITH DIFFERENTIAL/PLATELET
BASOS ABS: 42 {cells}/uL (ref 0–200)
Basophils Relative: 0.7 %
EOS PCT: 3.9 %
Eosinophils Absolute: 234 cells/uL (ref 15–500)
HCT: 35.4 % — ABNORMAL LOW (ref 38.5–50.0)
Hemoglobin: 11.4 g/dL — ABNORMAL LOW (ref 13.2–17.1)
Lymphs Abs: 2520 cells/uL (ref 850–3900)
MCH: 28.1 pg (ref 27.0–33.0)
MCHC: 32.2 g/dL (ref 32.0–36.0)
MCV: 87.2 fL (ref 80.0–100.0)
MONOS PCT: 7.4 %
MPV: 11.2 fL (ref 7.5–12.5)
NEUTROS ABS: 2760 {cells}/uL (ref 1500–7800)
Neutrophils Relative %: 46 %
Platelets: 180 10*3/uL (ref 140–400)
RBC: 4.06 10*6/uL — ABNORMAL LOW (ref 4.20–5.80)
RDW: 13.6 % (ref 11.0–15.0)
Total Lymphocyte: 42 %
WBC mixed population: 444 cells/uL (ref 200–950)
WBC: 6 10*3/uL (ref 3.8–10.8)

## 2017-02-23 LAB — HEMOGLOBIN A1C
EAG (MMOL/L): 5.5 (calc)
Hgb A1c MFr Bld: 5.1 % of total Hgb (ref <5.7)
Mean Plasma Glucose: 100 (calc)

## 2017-02-23 LAB — LIPID PANEL
CHOL/HDL RATIO: 3.7 (calc) (ref <5.0)
Cholesterol: 250 mg/dL — ABNORMAL HIGH (ref <200)
HDL: 67 mg/dL (ref >40)
LDL Cholesterol (Calc): 165 mg/dL (calc) — ABNORMAL HIGH
NON-HDL CHOLESTEROL (CALC): 183 mg/dL — AB (ref <130)
TRIGLYCERIDES: 81 mg/dL (ref <150)

## 2017-02-23 LAB — BASIC METABOLIC PANEL WITH GFR
BUN: 14 mg/dL (ref 7–25)
CALCIUM: 9.2 mg/dL (ref 8.6–10.3)
CHLORIDE: 105 mmol/L (ref 98–110)
CO2: 30 mmol/L (ref 20–32)
Creat: 0.76 mg/dL (ref 0.70–1.25)
GFR, EST NON AFRICAN AMERICAN: 95 mL/min/{1.73_m2} (ref >=60)
GFR, Est African American: 110 mL/min/{1.73_m2} (ref >=60)
Glucose, Bld: 80 mg/dL (ref 65–99)
POTASSIUM: 4.1 mmol/L (ref 3.5–5.3)
Sodium: 142 mmol/L (ref 135–146)

## 2017-02-23 LAB — TSH: TSH: 1.09 mIU/L (ref 0.40–4.50)

## 2017-02-23 LAB — PSA: PSA: 0.9 ng/mL (ref <=4.0)

## 2017-02-26 ENCOUNTER — Encounter: Payer: Self-pay | Admitting: Family Medicine

## 2017-02-26 DIAGNOSIS — M5106 Intervertebral disc disorders with myelopathy, lumbar region: Secondary | ICD-10-CM | POA: Diagnosis not present

## 2017-02-26 DIAGNOSIS — M4306 Spondylolysis, lumbar region: Secondary | ICD-10-CM | POA: Diagnosis not present

## 2017-02-26 HISTORY — DX: Spondylolysis, lumbar region: M43.06

## 2017-02-27 ENCOUNTER — Ambulatory Visit (INDEPENDENT_AMBULATORY_CARE_PROVIDER_SITE_OTHER): Payer: PPO | Admitting: Orthopedic Surgery

## 2017-02-27 DIAGNOSIS — M75102 Unspecified rotator cuff tear or rupture of left shoulder, not specified as traumatic: Secondary | ICD-10-CM | POA: Diagnosis not present

## 2017-02-27 NOTE — Progress Notes (Signed)
66 year old male had a undersurface tear of his left rotator cuff we sent for physical therapy did well he recovered nicely.  However about 2 weeks ago he was playing with his 82-year-old daughter she jerked his arm behind him and since that time he said pain decreased range of motion with internal rotation and weakness of his left shoulder  He would like to try a cortisone injection to see if he can get some pain relief to allow him to resume his home exercise program  He exhibits active abduction 90 flexion 120 external rotation 45  He does have tenderness in the peri-acromial region  We will go ahead and inject him have him follow-up in 4 weeks after home exercises and see if he has any improvement if not he would probably need to have his MRI repeated  Procedure note the subacromial injection shoulder left   Verbal consent was obtained to inject the  Left   Shoulder  Timeout was completed to confirm the injection site is a subacromial space of the  left  shoulder  Medication used Depo-Medrol 40 mg and lidocaine 1% 3 cc  Anesthesia was provided by ethyl chloride  The injection was performed in the left  posterior subacromial space. After pinning the skin with alcohol and anesthetized the skin with ethyl chloride the subacromial space was injected using a 20-gauge needle. There were no complications  Sterile dressing was applied.

## 2017-02-27 NOTE — Patient Instructions (Signed)
Continue heat, start home exercises after one week   Take over the counter tylenol or advil as needed   You have received an injection of steroids into the joint. 15% of patients will have increased pain within the 24 hours postinjection.   This is transient and will go away.   We recommend that you use ice packs on the injection site for 20 minutes every 2 hours and extra strength Tylenol 2 tablets every 8 as needed until the pain resolves.  If you continue to have pain after taking the Tylenol and using the ice please call the office for further instructions.

## 2017-03-06 NOTE — Progress Notes (Deleted)
    Patient ID: Charles Ayala, male    DOB: 1951-06-02, 66 y.o.   MRN: [id number]  No chief complaint on file.   Allergies Patient has no known allergies.  Subjective:   Charles Ayala is a 66 y.o. male who presents to [facility] today.  HPI HPI  Past Medical History:  Diagnosis Date  . Arthritis    neck and back  . History of stomach ulcers     Past Surgical History:  Procedure Laterality Date  . BACK SURGERY  1997   L4 L5  . CARPAL TUNNEL RELEASE      Family History  Problem Relation Age of Onset  . Parkinson's disease Mother   . Hypertension Mother   . Hypertension Father   . Cancer Sister        chemo related lung problems  . Diabetes Son      Social History   Social History  . Marital status: Married    Spouse name: N/A  . Number of children: N/A  . Years of education: N/A   Occupational History  . retired    Social History Main Topics  . Smoking status: Never Smoker  . Smokeless tobacco: Never Used  . Alcohol use No  . Drug use: No  . Sexual activity: Yes   Other Topics Concern  . Not on file   Social History Narrative   Retired. Used to work at [employer]. Married. Lives in [place]. Used to live in [place]. Has four children. Enjoys gardening and playing with grandchildren.     Review of Systems   Objective:   There were no vitals taken for this visit.  Physical Exam   Assessment and Plan   There are no diagnoses linked to this encounter.   No Follow-up on file. Amado Coe, [place] 03/06/2017

## 2017-03-07 ENCOUNTER — Ambulatory Visit: Payer: PPO | Admitting: Family Medicine

## 2017-03-14 ENCOUNTER — Ambulatory Visit: Payer: PPO | Admitting: Family Medicine

## 2017-03-27 ENCOUNTER — Ambulatory Visit: Payer: PPO | Admitting: Family Medicine

## 2017-03-27 ENCOUNTER — Encounter: Payer: Self-pay | Admitting: Orthopedic Surgery

## 2017-03-27 ENCOUNTER — Ambulatory Visit (INDEPENDENT_AMBULATORY_CARE_PROVIDER_SITE_OTHER): Payer: PPO | Admitting: Orthopedic Surgery

## 2017-03-27 VITALS — BP 123/71 | HR 59 | Ht 72.0 in | Wt 185.0 lb

## 2017-03-27 DIAGNOSIS — M75102 Unspecified rotator cuff tear or rupture of left shoulder, not specified as traumatic: Secondary | ICD-10-CM

## 2017-03-27 NOTE — Progress Notes (Signed)
Progress Note   Patient ID: Charles Ayala, male   DOB: December 08, 1950, 67 y.o.   MRN: [id number]  Chief Complaint  Patient presents with  . Follow-up    Left shoulder    66 year old male with partial rotator cuff tear treated with physical therapy. He reinjured the shoulder playing with his granddaughter. He rested and did some exercises and now shoulder feels normal again     Review of Systems  Neurological: Negative for tingling and sensory change.   Current Meds  Medication Sig  . gabapentin (NEURONTIN) 300 MG capsule   . hydrochlorothiazide (HYDRODIURIL) 25 MG tablet Take 1 tablet (25 mg total) by mouth daily.  . tamsulosin (FLOMAX) 0.4 MG CAPS capsule Take 1 capsule (0.4 mg total) by mouth daily.  . traMADol (ULTRAM) 50 MG tablet Take by mouth every 6 (six) hours as needed.     Physical Exam BP 123/71   Pulse (!) 59   Ht 6' (1.829 m)   Wt 185 lb (83.9 kg)   BMI 25.09 kg/m   Gen. appearance the patient's appearance is normal with normal grooming and  hygiene The patient is oriented to person place and time Mood and affect are normal  BP 123/71   Pulse (!) 59   Ht 6' (1.829 m)   Wt 185 lb (83.9 kg)   BMI 25.09 kg/m  Left Shoulder Exam   Range of Motion  Normal left shoulder ROM  Muscle Strength  Normal left shoulder strength       Medical decision-making Encounter Diagnosis  Name Primary?  . Tear of left rotator cuff, unspecified tear extent Yes    NORMAL ACTIVITY FU AS NEEDED  Arther Abbott, MD 03/27/2017 2:21 PM

## 2017-03-29 ENCOUNTER — Ambulatory Visit (INDEPENDENT_AMBULATORY_CARE_PROVIDER_SITE_OTHER): Payer: PPO | Admitting: Family Medicine

## 2017-03-29 ENCOUNTER — Ambulatory Visit: Payer: PPO | Admitting: Family Medicine

## 2017-03-29 ENCOUNTER — Encounter: Payer: Self-pay | Admitting: Family Medicine

## 2017-03-29 VITALS — BP 140/78 | HR 62 | Temp 98.7°F

## 2017-03-29 DIAGNOSIS — I10 Essential (primary) hypertension: Secondary | ICD-10-CM | POA: Diagnosis not present

## 2017-03-29 DIAGNOSIS — D649 Anemia, unspecified: Secondary | ICD-10-CM | POA: Diagnosis not present

## 2017-03-29 DIAGNOSIS — E7800 Pure hypercholesterolemia, unspecified: Secondary | ICD-10-CM

## 2017-03-29 MED ORDER — SIMVASTATIN 20 MG PO TABS: 20.0000 mg | ORAL_TABLET | Freq: Every day | ORAL | 3 refills | Status: DC

## 2017-03-29 NOTE — Progress Notes (Signed)
Patient ID: Charles Ayala, male    DOB: [birth year], 66 y.o.   MRN: [id number]  Chief Complaint  Patient presents with  . Hypertension    Allergies Patient has no known allergies.  Subjective:   Charles Ayala is a 66 y.o. male who presents to [facility] today.  HPI Here to follow-up for blood pressure and to discuss labs. Reports that he has been taking his blood pressure medication every day. However he did not take it today. Reports that blood pressure has been running well at home less than 140/90. Reports that he has never been on cholesterol medication in the past and is not currently taking aspirin. He does not smoke or drink alcohol. Has never had any history of a heart attack or stroke. Has never been told he was anemic in the past. He does not eat red meat. Patient would like to go over all of his labs in detail.      Past Medical History:  Diagnosis Date  . Arthritis    neck and back  . History of stomach ulcers     Past Surgical History:  Procedure Laterality Date  . BACK SURGERY  1997   L4 L5  . CARPAL TUNNEL RELEASE      Family History  Problem Relation Age of Onset  . Parkinson's disease Mother   . Hypertension Mother   . Hypertension Father   . Cancer Sister        chemo related lung problems  . Diabetes Son      Social History   Social History  . Marital status: Married    Spouse name: N/A  . Number of children: N/A  . Years of education: N/A   Occupational History  . retired    Social History Main Topics  . Smoking status: Never Smoker  . Smokeless tobacco: Never Used  . Alcohol use No  . Drug use: No  . Sexual activity: Yes   Other Topics Concern  . None   Social History Narrative   Retired. Used to work at [employer]. Married. Lives in [place]. Used to live in [place]. Has four children. Enjoys gardening and playing with grandchildren.     Review of Systems  Constitutional: Negative for appetite  change, chills, diaphoresis, fatigue, fever and unexpected weight change.  HENT: Negative for dental problem, nosebleeds and trouble swallowing.   Respiratory: Negative for cough, choking, chest tightness and shortness of breath.   Cardiovascular: Negative for chest pain, palpitations and leg swelling.  Gastrointestinal: Negative for abdominal pain, anal bleeding, blood in stool, constipation, diarrhea, nausea, rectal pain and vomiting.       Reports that several weeks ago he had very dark stools but has not had any since. He denies seeing any blood in his stools.  Genitourinary: Negative for dysuria, hematuria and urgency.  Musculoskeletal: Negative for arthralgias and myalgias.  Skin: Negative for pallor and rash.  Neurological: Negative for dizziness, tremors, facial asymmetry, light-headedness, numbness and headaches.  Hematological: Negative for adenopathy. Does not bruise/bleed easily.     Objective:   BP 140/78 (BP Location: Right Arm, Patient Position: Sitting, Cuff Size: Normal)   Pulse 62   Temp 98.7 F (37.1 C) (Temporal)   SpO2 100%   Physical Exam  Constitutional: He is oriented to person, place, and time. He appears well-developed and well-nourished.  HENT:  Head: Normocephalic and atraumatic.  Nose: Nose normal.  Mouth/Throat: Oropharynx is clear and moist. No oropharyngeal exudate.  Eyes: Pupils are equal, round, and reactive to light. EOM are normal. Right eye exhibits no discharge. No scleral icterus.  Neck: Normal range of motion. Neck supple. No JVD present. No tracheal deviation present. No thyromegaly present.  Cardiovascular: Normal rate, regular rhythm and normal heart sounds.   Pulses:      Dorsalis pedis pulses are 2+ on the right side, and 2+ on the left side.  Pulmonary/Chest: Effort normal and breath sounds normal.  Abdominal: Soft. Bowel sounds are normal. He exhibits no distension.  Musculoskeletal: He exhibits no edema.  Lymphadenopathy:    He has  no cervical adenopathy.  Neurological: He is alert and oriented to person, place, and time. No cranial nerve deficit.  Skin: Skin is warm, dry and intact.  Psychiatric: He has a normal mood and affect. His behavior is normal. Thought content normal.  Vitals reviewed.    Assessment and Plan   1. Anemia, unspecified type Anemia of uncertain etiology and no prior history in the past. Patient is not currently on any antiplatelet agents including aspirin. Colonoscopy is up to date. Check labs and due to patient history of questionable darker stools several weeks ago, it still anemic on labs then referred to GI for evaluation. Will assess labs and call patient with plan next week. That time today discussing possible etiologies of anemia and different types of anemia. Plan to patient why just putting him on an iron pill was not the answer. She is agreeable to workup. - Fe+TIBC+Fer - Retic - CBC with Differential/Platelet  2. Elevated low density lipoprotein (LDL) cholesterol level Hyperlipidemia and the associated risk of ASCVD were discussed today. Primary vs. Secondary prevention of ASCVD were discussed and how it relates to patient morbidity, mortality, and quality of life. Shared decision making with patient including the risks of statins vs.benefits of ASCVD risk reduction discussed.  Risks of stains discussed including myopathy, rhabdomyoloysis, liver problems, increased risk of diabetes discussed. We discussed heart healthy diet, lifestyle modifications, risk factor modifications, and adherence to the recommended treatment plan. We discussed the need to periodically monitor lipid panel and liver function tests while on statin therapy.  ASCVD risk greater than 20% over 10 years. Will hold aspirin at this time due to questionable anemia. Plan on rechecking cholesterol in 3 months. Patient elected today for moderate dose therapy and will increase if needed to get his LDL goal less than 100. Patient  was congratulated on his HDL. - simvastatin (ZOCOR) 20 MG tablet; Take 1 tablet (20 mg total) by mouth at bedtime.  Dispense: 90 tablet; Refill: 3  3. Essential hypertension Suspect the patient's blood pressure is a little bit lower then recorded today since he did not take his medication. Will not add additional agent at this time but will recheck at follow-up and patient will work on decreasing his sodium intake.Lifestyle modifications discussed with patient including a diet emphasizing vegetables, fruits, and whole grains. Limiting intake of sodium to less than 2,400 mg per day.  Recommendations discussed include consuming low-fat dairy products, poultry, fish, legumes, non-tropical vegetable oils, and nuts; and limiting intake of sweets, sugar-sweetened beverages, and red meat. Discussed following a plan such as the Dietary Approaches to Stop Hypertension (DASH) diet. Patient to read up on this diet.   Patient to call with any questions concerns or worrisome symptomatology. Office visit today was greater than 45 minutes. Return in about 2 months (around 05/29/2017). Caren Macadam, MD 03/29/2017

## 2017-03-30 LAB — CBC WITH DIFFERENTIAL/PLATELET
BASOS PCT: 0.7 %
Basophils Absolute: 42 cells/uL (ref 0–200)
EOS PCT: 2.3 %
Eosinophils Absolute: 138 cells/uL (ref 15–500)
HEMATOCRIT: 36 % — AB (ref 38.5–50.0)
Hemoglobin: 12 g/dL — ABNORMAL LOW (ref 13.2–17.1)
LYMPHS ABS: 3042 {cells}/uL (ref 850–3900)
MCH: 28.9 pg (ref 27.0–33.0)
MCHC: 33.3 g/dL (ref 32.0–36.0)
MCV: 86.7 fL (ref 80.0–100.0)
MPV: 11.4 fL (ref 7.5–12.5)
Monocytes Relative: 6.6 %
Neutro Abs: 2382 cells/uL (ref 1500–7800)
Neutrophils Relative %: 39.7 %
PLATELETS: 199 10*3/uL (ref 140–400)
RBC: 4.15 10*6/uL — AB (ref 4.20–5.80)
RDW: 13.3 % (ref 11.0–15.0)
Total Lymphocyte: 50.7 %
WBC: 6 10*3/uL (ref 3.8–10.8)
WBCMIX: 396 {cells}/uL (ref 200–950)

## 2017-03-30 LAB — IRON,TIBC AND FERRITIN PANEL
%SAT: 23 % (calc) (ref 15–60)
Ferritin: 310 ng/mL (ref 20–380)
IRON: 72 ug/dL (ref 50–180)
TIBC: 316 mcg/dL (calc) (ref 250–425)

## 2017-03-30 LAB — RETICULOCYTES
ABS Retic: 29960 cells/uL (ref 25000–9000)
Retic Ct Pct: 0.7 %

## 2017-04-02 ENCOUNTER — Telehealth: Payer: Self-pay | Admitting: Family Medicine

## 2017-04-02 DIAGNOSIS — D649 Anemia, unspecified: Secondary | ICD-10-CM

## 2017-04-02 NOTE — Telephone Encounter (Signed)
Please call and advise patient that he was still anemic. However, his iron stores are not low. His serum iron is borderline but his iron stores in his body are ok. His bone marrow seems to be functioning well based on his labs. I would recommend that we send him for evaluation by Gastroenterology at this time to get his colonoscopy and upper endoscopy.  Please find out which GI he has seen in the past, so that we can do referral. Thanks.

## 2017-04-02 NOTE — Telephone Encounter (Signed)
Tried to call patient twice, can not get through to patient

## 2017-04-03 NOTE — Telephone Encounter (Signed)
I have been unable to reach this patient by phone.  A letter is being sent.

## 2017-04-12 ENCOUNTER — Encounter: Payer: Self-pay | Admitting: Internal Medicine

## 2017-04-30 ENCOUNTER — Encounter: Payer: Self-pay | Admitting: Family Medicine

## 2017-04-30 ENCOUNTER — Other Ambulatory Visit: Payer: Self-pay

## 2017-04-30 ENCOUNTER — Ambulatory Visit: Payer: PPO | Admitting: Family Medicine

## 2017-04-30 VITALS — BP 136/68 | HR 54 | Temp 97.8°F | Resp 16 | Ht 72.0 in | Wt 189.8 lb

## 2017-04-30 DIAGNOSIS — D649 Anemia, unspecified: Secondary | ICD-10-CM | POA: Diagnosis not present

## 2017-04-30 DIAGNOSIS — E782 Mixed hyperlipidemia: Secondary | ICD-10-CM | POA: Diagnosis not present

## 2017-04-30 DIAGNOSIS — N400 Enlarged prostate without lower urinary tract symptoms: Secondary | ICD-10-CM | POA: Diagnosis not present

## 2017-04-30 DIAGNOSIS — Z79899 Other long term (current) drug therapy: Secondary | ICD-10-CM

## 2017-04-30 DIAGNOSIS — I10 Essential (primary) hypertension: Secondary | ICD-10-CM

## 2017-04-30 DIAGNOSIS — R011 Cardiac murmur, unspecified: Secondary | ICD-10-CM | POA: Diagnosis not present

## 2017-04-30 DIAGNOSIS — R001 Bradycardia, unspecified: Secondary | ICD-10-CM | POA: Diagnosis not present

## 2017-04-30 MED ORDER — TAMSULOSIN HCL 0.4 MG PO CAPS: 0.4000 mg | ORAL_CAPSULE | Freq: Every day | ORAL | 3 refills | Status: AC

## 2017-04-30 NOTE — Progress Notes (Signed)
Patient ID: Charles Ayala, male    DOB: [birth year], 66 y.o.   MRN: [id number]  Chief Complaint  Patient presents with  . Follow-up  . Hypertension    Allergies Patient has no known allergies.  Subjective:   Charles Ayala is a 66 y.o. male who presents to [facility] today.  HPI Charles Ayala presents today for follow-up on his blood pressure.  At his last visit his pressure was slightly elevated but he had not taken his medication.  He reports he has been compliant with his medication.  He denies any side effects.  His blood pressure has been running well.  He would also like a refill on his medication for his BPH.  He had a recent prostate exam and PSA testing which was normal.  He reports that this medicine helps with his urinary symptoms related to his prostate.  He reports he would like for me to explain to him why he needs a colonoscopy and evaluation for his anemia when he feels good.  He is not taking any aspirin at this time.  He does take tramadol on a daily basis for chronic back pain.  He has never been anemic in the past.  He had a screening colonoscopy done less than 10 years ago which was normal.  He was not anemic at that time.  He does eat iron rich foods.  He denies any bleeding other than seeing some black stools a couple months ago but none since then.  He denies any chest pain, shortness of breath, or swelling in his extremities.  After examining him today I asked him if he had ever been told he had a heart murmur.  He reports that one other doctor told him he had a murmur but he did not get any evaluation of this.  I asked him also if his heart rate is always been so slow.  He reports that he is not sure but he denies any lightheadedness or abnormal/irregular heartbeats.  Reports he is taking his cholesterol medicine is not having any side effects.  Denies any muscle pains.   Hypertension  This is a chronic problem. The current episode started 1 to 4  weeks ago. The problem has been waxing and waning since onset. The problem is controlled. Pertinent negatives include no anxiety, blurred vision, chest pain, headaches, malaise/fatigue, neck pain, orthopnea, palpitations, peripheral edema, PND or shortness of breath. There are no associated agents to hypertension. Risk factors for coronary artery disease include dyslipidemia, male gender and sedentary lifestyle. Past treatments include diuretics. The current treatment provides moderate improvement. There are no compliance problems.  There is no history of angina, kidney disease, CAD/MI, CVA, heart failure, left ventricular hypertrophy, PVD or retinopathy.    Past Medical History:  Diagnosis Date  . Arthritis    neck and back  . History of stomach ulcers     Past Surgical History:  Procedure Laterality Date  . BACK SURGERY  1997   L4 L5  . CARPAL TUNNEL RELEASE      Family History  Problem Relation Age of Onset  . Parkinson's disease Mother   . Hypertension Mother   . Hypertension Father   . Cancer Sister        chemo related lung problems  . Diabetes Son      Social History   Socioeconomic History  . Marital status: Married    Spouse name: None  . Number of children: None  . Years  of education: None  . Highest education level: None  Social Needs  . Financial resource strain: None  . Food insecurity - worry: None  . Food insecurity - inability: None  . Transportation needs - medical: None  . Transportation needs - non-medical: None  Occupational History  . Occupation: retired  Tobacco Use  . Smoking status: Never Smoker  . Smokeless tobacco: Never Used  Substance and Sexual Activity  . Alcohol use: No  . Drug use: No  . Sexual activity: Yes  Other Topics Concern  . None  Social History Narrative   Retired. Used to work at [employer]. Married. Lives in [place]. Used to live in [place]. Has four children. Enjoys gardening and playing with grandchildren.      Review of Systems  Constitutional: Negative for activity change, appetite change, diaphoresis, fatigue and malaise/fatigue.  HENT: Negative for nosebleeds, trouble swallowing and voice change.   Eyes: Negative for blurred vision.  Respiratory: Negative for chest tightness and shortness of breath.   Cardiovascular: Negative for chest pain, palpitations, orthopnea and PND.  Gastrointestinal: Negative for abdominal pain, anal bleeding, blood in stool, constipation and rectal pain.  Genitourinary: Negative for difficulty urinating and hematuria.  Musculoskeletal: Negative for neck pain.  Skin: Negative for rash.  Neurological: Negative for dizziness, tremors, syncope, weakness, light-headedness and headaches.  Hematological: Negative for adenopathy. Does not bruise/bleed easily.   Current Outpatient Medications on File Prior to Visit  Medication Sig Dispense Refill  . gabapentin (NEURONTIN) 300 MG capsule     . hydrochlorothiazide (HYDRODIURIL) 25 MG tablet Take 1 tablet (25 mg total) by mouth daily. 90 tablet 0  . simvastatin (ZOCOR) 20 MG tablet Take 1 tablet (20 mg total) by mouth at bedtime. 90 tablet 3  . traMADol (ULTRAM) 50 MG tablet Take by mouth every 6 (six) hours as needed.     No current facility-administered medications on file prior to visit.      Objective:   BP 136/68 (BP Location: Left Arm, Patient Position: Sitting, Cuff Size: Normal)   Pulse (!) 54   Temp 97.8 F (36.6 C) (Other (Comment))   Resp 16   Ht 6' (1.829 m)   Wt 189 lb 12 oz (86.1 kg)   SpO2 99%   BMI 25.73 kg/m   Physical Exam  Constitutional: He is oriented to person, place, and time. He appears well-developed and well-nourished.  HENT:  Head: Normocephalic and atraumatic.  Mouth/Throat: Oropharynx is clear and moist.  Eyes: Conjunctivae and EOM are normal. Pupils are equal, round, and reactive to light.  Neck: Normal range of motion. Neck supple. No JVD present. No tracheal deviation  present.  Cardiovascular: Regular rhythm. Bradycardia present. Exam reveals no gallop and no friction rub.  Murmur heard.  Systolic murmur is present with a grade of 3/6. Pulmonary/Chest: Effort normal and breath sounds normal.  Lymphadenopathy:    He has no cervical adenopathy.  Neurological: He is alert and oriented to person, place, and time.  Skin: Skin is warm and dry. No rash noted.  Psychiatric: He has a normal mood and affect. His behavior is normal.  Vitals reviewed.    Assessment and Plan   1. Heart murmur Uncertain etiology, with no prior workup.  Will get echo and refer to cardiology for evaluation since patient is also bradycardic.  Patient has no known history of cardiac disease and does not exercise on a regular basis.  Patient is on no medications which would lower his heart rate. -  ECHOCARDIOGRAM COMPLETE; Future - Ambulatory referral to Cardiology  2. Bradycardia Patient counseled that if he developed any chest pain, shortness of breath, lightheadedness go to the emergency department for evaluation. - Ambulatory referral to Cardiology  3. Benign prostatic hyperplasia without lower urinary tract symptoms Medication refilled - tamsulosin (FLOMAX) 0.4 MG CAPS capsule; Take 1 capsule (0.4 mg total) daily by mouth.  Dispense: 90 capsule; Refill: 3  4. Essential hypertension Blood pressure stable.  Recheck in 6 months.  5. Mixed hyperlipidemia We will plan on rechecking cholesterol in 3 months.  Patient will return to lab prior to visit and get the studies done.  Continue Zocor.  - Lipid panel  6. High risk medication use Check because he is on statin therapy. - Hepatic function panel  7. Anemia, unspecified type Long discussion today with patient regarding why he needs GI evaluation for anemia.  We discussed that anemia is not a normal lab finding in a 66 year old male.  He is not on any aspirin therapy or medications that put him at risk for bleeding.  He will  follow-up with GI as directed.  All his questions were answered.  We will plan on rechecking his labs in 3 months. - CBC with Differential/Platelet - Fe+TIBC+Fer  Return in about 3 months (around 07/31/2017) for BP. Caren Macadam, MD 04/30/2017

## 2017-05-06 ENCOUNTER — Encounter: Payer: Self-pay | Admitting: Cardiology

## 2017-05-06 NOTE — Progress Notes (Signed)
Cardiology Office Note  Date: 05/07/2017   ID: Jibran Crookshanks, DOB Sep 13, 1950, MRN [id number]  PCP: Caren Macadam, MD  Consulting Cardiologist: Rozann Lesches, MD   Chief Complaint  Patient presents with  . Heart Murmur    History of Present Illness: Vic Esco is a 66 y.o. male referred for cardiology consultation by Dr. Mannie Stabile for evaluation of a heart murmur.  He presents today with his wife.  He tells me that he has been aware of having a heart murmur for about a year, was told this by Dr. Maudie Mercury his previous PCP.  He does not have any known diagnosis of valvular heart disease although has not undergone an echocardiogram.  He states that he does have chronic back pain and has lifting restrictions associated with this, but otherwise no major functional limitation.  He states that he enjoys walking for exercise with his wife, does not report any limiting shortness of breath or chest pain.  I reviewed his current medications.  He is currently on HCTZ for control of high blood pressure.  Systolic is in the 132G today.  He is also on Zocor with recent LDL 165.  I personally reviewed his ECG today which shows sinus rhythm with probable lead artifact and PAC.  Although somewhat off topic, I did find my encounter with Mr. Bhakta unusual today.  He fairly spontaneously voiced a lot of frustrations about health care inconsistencies, transition from PCP to PCP, getting to the office early for his appointment today, also his also his wife's recent medication concerns.  I was not exactly sure what to make of this.  Past Medical History:  Diagnosis Date  . Arthritis   . History of stomach ulcers   . Hyperlipidemia   . Hypertension     Past Surgical History:  Procedure Laterality Date  . BACK SURGERY  1997   L4 L5  . CARPAL TUNNEL RELEASE      Current Outpatient Medications  Medication Sig Dispense Refill  . gabapentin (NEURONTIN) 300 MG capsule     . hydrochlorothiazide  (HYDRODIURIL) 25 MG tablet Take 1 tablet (25 mg total) by mouth daily. 90 tablet 0  . simvastatin (ZOCOR) 20 MG tablet Take 1 tablet (20 mg total) by mouth at bedtime. 90 tablet 3  . tamsulosin (FLOMAX) 0.4 MG CAPS capsule Take 1 capsule (0.4 mg total) daily by mouth. 90 capsule 3  . traMADol (ULTRAM) 50 MG tablet Take by mouth every 6 (six) hours as needed.     No current facility-administered medications for this visit.    Allergies:  Patient has no known allergies.   Social History: The patient  reports that  has never smoked. he has never used smokeless tobacco. He reports that he does not drink alcohol or use drugs.   Family History: The patient's family history includes Cancer in his sister; Diabetes in his son; Hypertension in his father and mother; Parkinson's disease in his mother.   ROS:  Please see the history of present illness. Otherwise, complete review of systems is positive for chronic back pain.  All other systems are reviewed and negative.   Physical Exam: VS:  BP 130/62   Pulse 61   Ht 6' (1.829 m)   Wt 186 lb (84.4 kg)   SpO2 98%   BMI 25.23 kg/m , BMI Body mass index is 25.23 kg/m.  Wt Readings from Last 3 Encounters:  05/07/17 186 lb (84.4 kg)  04/30/17 189 lb 12 oz (86.1 kg)  03/27/17 185 lb (83.9 kg)    General: Patient appears comfortable at rest. HEENT: Conjunctiva and lids normal, oropharynx clear. Neck: Supple, no elevated JVP or carotid bruits, no thyromegaly. Lungs: Clear to auscultation, nonlabored breathing at rest. Cardiac: Regular rate and rhythm, no S3, 3/6 systolic murmur best heard at right base and less prominent at the apex, no pericardial rub. Abdomen: Soft, nontender, bowel sounds present, no guarding or rebound. Extremities: No pitting edema, distal pulses 2+. Skin: Warm and dry. Musculoskeletal: No kyphosis. Neuropsychiatric: Alert and oriented x3, affect grossly appropriate.  ECG: There is no prior tracing for review.  Recent  Labwork: 02/22/2017: BUN 14; Creat 0.76; Potassium 4.1; Sodium 142; TSH 1.09 03/29/2017: Hemoglobin 12.0; Platelets 199     Component Value Date/Time   CHOL 250 (H) 02/22/2017 1056   TRIG 81 02/22/2017 1056   HDL 67 02/22/2017 1056   CHOLHDL 3.7 02/22/2017 1056    Other Studies Reviewed Today:  CKR 07/03/2013: FINDINGS: Normal heart size and pulmonary vascularity.  Tortuous thoracic aorta.  Lungs clear.  No pleural effusion or pneumothorax.  Bones unremarkable.  IMPRESSION: No acute abnormalities.  Assessment and Plan:  1.  Systolic heart murmur most prominent in aortic position.  Patient has never had a previous echocardiogram based on records and his recollection.  We will arrange an echocardiogram to assess cardiac structure and function.  Based on this information disposition can be determined.  2.  Hypertension, currently on HCTZ.  He is now following with Dr. Mannie Stabile.  3.  Hyperlipidemia, recent LDL 165.  He has now on Zocor.  4.  Chronic lower back pain.  Current medicines were reviewed with the patient today.   Orders Placed This Encounter  Procedures  . EKG 12-Lead  . ECHOCARDIOGRAM COMPLETE    Disposition: Call with test results.  Signed, Satira Sark, MD, [facility] 05/07/2017 10:54 AM    [place] [facility] [place], [place], [place] [address] Phone: [phone]; Fax: [phone]

## 2017-05-07 ENCOUNTER — Encounter: Payer: Self-pay | Admitting: Cardiology

## 2017-05-07 ENCOUNTER — Ambulatory Visit: Payer: PPO | Admitting: Cardiology

## 2017-05-07 VITALS — BP 130/62 | HR 61 | Ht 72.0 in | Wt 186.0 lb

## 2017-05-07 DIAGNOSIS — M545 Low back pain: Secondary | ICD-10-CM

## 2017-05-07 DIAGNOSIS — I10 Essential (primary) hypertension: Secondary | ICD-10-CM

## 2017-05-07 DIAGNOSIS — G8929 Other chronic pain: Secondary | ICD-10-CM | POA: Diagnosis not present

## 2017-05-07 DIAGNOSIS — E782 Mixed hyperlipidemia: Secondary | ICD-10-CM | POA: Diagnosis not present

## 2017-05-07 DIAGNOSIS — R011 Cardiac murmur, unspecified: Secondary | ICD-10-CM

## 2017-05-07 NOTE — Patient Instructions (Signed)

## 2017-05-16 ENCOUNTER — Other Ambulatory Visit: Payer: Self-pay

## 2017-05-16 ENCOUNTER — Ambulatory Visit (INDEPENDENT_AMBULATORY_CARE_PROVIDER_SITE_OTHER): Payer: PPO

## 2017-05-16 DIAGNOSIS — R011 Cardiac murmur, unspecified: Secondary | ICD-10-CM | POA: Diagnosis not present

## 2017-05-17 ENCOUNTER — Telehealth: Payer: Self-pay

## 2017-05-17 DIAGNOSIS — I10 Essential (primary) hypertension: Secondary | ICD-10-CM

## 2017-05-17 NOTE — Telephone Encounter (Signed)
-----   Message from Satira Sark, MD sent at 05/17/2017  1:24 PM EST ----- Results reviewed.  Shows mild aortic stenosis and moderate mitral regurgitation which would be consistent overall with his heart murmur.  He will need to have follow-up of this, would suggest an office visit with repeat echocardiogram preceding that visit in 1 year. A copy of this test should be forwarded to Caren Macadam, MD.

## 2017-05-17 NOTE — Telephone Encounter (Signed)
Unable to LM

## 2017-05-21 ENCOUNTER — Other Ambulatory Visit: Payer: Self-pay | Admitting: Family Medicine

## 2017-05-21 DIAGNOSIS — I10 Essential (primary) hypertension: Secondary | ICD-10-CM

## 2017-05-21 NOTE — Telephone Encounter (Signed)
Seen 11 13 18

## 2017-05-21 NOTE — Telephone Encounter (Signed)
Unable to LM

## 2017-05-21 NOTE — Telephone Encounter (Signed)
-----   Message from Laurine Blazer, LPN sent at 33/35/4562  5:31 PM EST -----   ----- Message ----- From: Satira Sark, MD Sent: 05/17/2017   1:24 PM To: Merlene Laughter, LPN, Caren Macadam, MD  Results reviewed.  Shows mild aortic stenosis and moderate mitral regurgitation which would be consistent overall with his heart murmur.  He will need to have follow-up of this, would suggest an office visit with repeat echocardiogram preceding that visit in 1 year. A copy of this test should be forwarded to Caren Macadam, MD.

## 2017-05-28 ENCOUNTER — Ambulatory Visit: Payer: PPO | Admitting: Gastroenterology

## 2017-05-29 NOTE — Telephone Encounter (Signed)
Patient notified. Routed to PCP 

## 2017-05-29 NOTE — Telephone Encounter (Signed)
-----   Message from Laurine Blazer, LPN sent at 25/36/6440  5:31 PM EST -----   ----- Message ----- From: Satira Sark, MD Sent: 05/17/2017   1:24 PM To: Merlene Laughter, LPN, Caren Macadam, MD  Results reviewed.  Shows mild aortic stenosis and moderate mitral regurgitation which would be consistent overall with his heart murmur.  He will need to have follow-up of this, would suggest an office visit with repeat echocardiogram preceding that visit in 1 year. A copy of this test should be forwarded to Caren Macadam, MD.

## 2017-06-04 DIAGNOSIS — M5106 Intervertebral disc disorders with myelopathy, lumbar region: Secondary | ICD-10-CM | POA: Diagnosis not present

## 2017-06-04 DIAGNOSIS — M4306 Spondylolysis, lumbar region: Secondary | ICD-10-CM | POA: Diagnosis not present

## 2017-06-27 ENCOUNTER — Other Ambulatory Visit: Payer: Self-pay | Admitting: Family Medicine

## 2017-06-27 DIAGNOSIS — I10 Essential (primary) hypertension: Secondary | ICD-10-CM

## 2017-07-22 ENCOUNTER — Ambulatory Visit: Payer: PPO | Admitting: Gastroenterology

## 2017-07-22 ENCOUNTER — Encounter: Payer: Self-pay | Admitting: Gastroenterology

## 2017-07-22 DIAGNOSIS — R110 Nausea: Secondary | ICD-10-CM

## 2017-07-22 DIAGNOSIS — D649 Anemia, unspecified: Secondary | ICD-10-CM | POA: Diagnosis not present

## 2017-07-22 HISTORY — DX: Nausea: R11.0

## 2017-07-22 NOTE — Progress Notes (Addendum)
Primary Care Physician:  Caren Macadam, MD  Primary Gastroenterologist:  Garfield Cornea, MD   Chief Complaint  Patient presents with  . Anemia    HPI:  Charles Ayala is a 67 y.o. male here at the request of PCP for further evaluation of anemia. Back in 02/2017 his Hgb was 11.4, normal MCV. Hgb 12 in 03/2017 with stable MCV. Hemoccult status unknown. Last colonoscopy reported to be done in 2010 and was unremarkable. Patient gives remote h/o PUD over 30 years ago. Describes taking H.pylori treatment remotely.   Denies heartburn. No abd pain. Some early morning nausea but no vomiting. No dysphagia. Couple of months ago stools were darker. No frank melena. No brbpr. Stools little looser than normal. Couple per day. Reports 30 pound weight loss in 2017 associated with depression but stable weight over the past one year.   Denies NSAIDS/ASA.     Current Outpatient Medications  Medication Sig Dispense Refill  . gabapentin (NEURONTIN) 300 MG capsule Take 300 mg by mouth 3 (three) times daily.     . hydrochlorothiazide (HYDRODIURIL) 25 MG tablet TAKE 1 TABLET BY MOUTH ONCE DAILY 90 tablet 0  . simvastatin (ZOCOR) 20 MG tablet Take 1 tablet (20 mg total) by mouth at bedtime. 90 tablet 3  . tamsulosin (FLOMAX) 0.4 MG CAPS capsule Take 1 capsule (0.4 mg total) daily by mouth. 90 capsule 3  . traMADol (ULTRAM) 50 MG tablet Take by mouth every 6 (six) hours as needed.    Marland Kitchen HYDROcodone-acetaminophen (NORCO) 7.5-325 MG tablet Take 1 tablet by mouth 4 (four) times daily as needed.     No current facility-administered medications for this visit.     Allergies as of 07/22/2017  . (No Known Allergies)    Past Medical History:  Diagnosis Date  . Arthritis   . History of stomach ulcers   . Hyperlipidemia   . Hypertension     Past Surgical History:  Procedure Laterality Date  . BACK SURGERY  1997   L4 L5  . CARPAL TUNNEL RELEASE    . COLONOSCOPY  02/2009   Dr. Laural Golden: few sigmoid diverticula,  one at cecum, small submucosal lipoma at prox transverse colon    Family History  Problem Relation Age of Onset  . Parkinson's disease Mother   . Hypertension Mother   . Hypertension Father   . Cancer Sister        Chemo related lung problems  . Diabetes Son   . Colon cancer Neg Hx     Social History   Socioeconomic History  . Marital status: Married    Spouse name: Not on file  . Number of children: Not on file  . Years of education: Not on file  . Highest education level: Not on file  Social Needs  . Financial resource strain: Not on file  . Food insecurity - worry: Not on file  . Food insecurity - inability: Not on file  . Transportation needs - medical: Not on file  . Transportation needs - non-medical: Not on file  Occupational History  . Occupation: retired  Tobacco Use  . Smoking status: Never Smoker  . Smokeless tobacco: Never Used  Substance and Sexual Activity  . Alcohol use: No  . Drug use: No  . Sexual activity: Yes  Other Topics Concern  . Not on file  Social History Narrative   Retired. Used to work at [employer]. Married. Lives in [place]. Used to live in [place]. Has four children. Enjoys  gardening and playing with grandchildren.       ROS:  General: Negative for anorexia, weight loss, fever, chills, fatigue, weakness. Eyes: Negative for vision changes.  ENT: Negative for hoarseness, difficulty swallowing , nasal congestion. CV: Negative for chest pain, angina, palpitations, dyspnea on exertion, peripheral edema.  Respiratory: Negative for dyspnea at rest, dyspnea on exertion, cough, sputum, wheezing.  GI: See history of present illness. GU:  Negative for dysuria, hematuria, urinary incontinence, urinary frequency, nocturnal urination.  MS: Negative for joint pain, low back pain.  Derm: Negative for rash or itching.  Neuro: Negative for weakness, abnormal sensation, seizure, frequent headaches, memory loss, confusion.  Psych: Negative  for anxiety, depression, suicidal ideation, hallucinations.  Endo: Negative for unusual weight change.  Heme: Negative for bruising or bleeding. Allergy: Negative for rash or hives.    Physical Examination:  BP (!) 162/75   Pulse (!) 58   Temp 97.7 F (36.5 C) (Oral)   Ht 6' (1.829 m)   Wt 180 lb 3.2 oz (81.7 kg)   BMI 24.44 kg/m    General: Well-nourished, well-developed in no acute distress.  Head: Normocephalic, atraumatic.   Eyes: Conjunctiva pink, no icterus. Mouth: Oropharyngeal mucosa moist and pink , no lesions erythema or exudate. Neck: Supple without thyromegaly, masses, or lymphadenopathy.  Lungs: Clear to auscultation bilaterally.  Heart: Regular rate and rhythm, no murmurs rubs or gallops.  Abdomen: Bowel sounds are normal, nontender, nondistended, no hepatosplenomegaly or masses, no abdominal bruits or    hernia , no rebound or guarding.   Rectal: Not performed Extremities: No lower extremity edema. No clubbing or deformities.  Neuro: Alert and oriented x 4 , grossly normal neurologically.  Skin: Warm and dry, no rash or jaundice.   Psych: Alert and cooperative, normal mood and affect.  Labs: Lab Results  Component Value Date   CREATININE 0.76 02/22/2017   BUN 14 02/22/2017   NA 142 02/22/2017   K 4.1 02/22/2017   CL 105 02/22/2017   CO2 30 02/22/2017   No results found for: ALT, AST, GGT, ALKPHOS, BILITOT Lab Results  Component Value Date   WBC 6.0 03/29/2017   HGB 12.0 (L) 03/29/2017   HCT 36.0 (L) 03/29/2017   MCV 86.7 03/29/2017   PLT 199 03/29/2017   Lab Results  Component Value Date   IRON 72 03/29/2017   TIBC 316 03/29/2017   FERRITIN 310 03/29/2017   No results found for: VITAMINB12 No results found for: FOLATE   Imaging Studies: No results found.

## 2017-07-22 NOTE — Assessment & Plan Note (Signed)
66 year old gentleman with normocytic anemia as outlined above.  Some vague change in bowel habits, stool somewhat looser than usual.  Some vague early morning nausea which resolves shortly.  No vomiting.  Weight is been stable this year.  No NSAIDs or aspirin use.  He reports his last colonoscopy in 2010.  We have requested records.  Would at least offer colonoscopy if his history is correct.  He may need upper endoscopy as well but would like to check Hemoccult status, follow-up on upcoming labs.  Further recommendations to follow.

## 2017-07-22 NOTE — Patient Instructions (Signed)
1. Please complete your labs for PCP. 2. Please return stool specimen to our office.  3. We will obtain copy of your last colonoscopy report for review. Further recommendations to follow.

## 2017-07-23 NOTE — Progress Notes (Signed)
CC'D TO PCP °

## 2017-07-24 ENCOUNTER — Ambulatory Visit (INDEPENDENT_AMBULATORY_CARE_PROVIDER_SITE_OTHER): Payer: PPO | Admitting: Gastroenterology

## 2017-07-24 DIAGNOSIS — D649 Anemia, unspecified: Secondary | ICD-10-CM | POA: Diagnosis not present

## 2017-07-24 LAB — IFOBT (OCCULT BLOOD): IMMUNOLOGICAL FECAL OCCULT BLOOD TEST: NEGATIVE

## 2017-07-25 ENCOUNTER — Encounter: Payer: Self-pay | Admitting: Family Medicine

## 2017-07-25 DIAGNOSIS — D649 Anemia, unspecified: Secondary | ICD-10-CM | POA: Diagnosis not present

## 2017-07-25 DIAGNOSIS — E782 Mixed hyperlipidemia: Secondary | ICD-10-CM | POA: Diagnosis not present

## 2017-07-25 DIAGNOSIS — Z79899 Other long term (current) drug therapy: Secondary | ICD-10-CM | POA: Diagnosis not present

## 2017-07-26 LAB — CBC WITH DIFFERENTIAL/PLATELET
BASOS ABS: 42 {cells}/uL (ref 0–200)
Basophils Relative: 0.8 %
EOS PCT: 5.5 %
Eosinophils Absolute: 286 cells/uL (ref 15–500)
HEMATOCRIT: 36.3 % — AB (ref 38.5–50.0)
Hemoglobin: 12.1 g/dL — ABNORMAL LOW (ref 13.2–17.1)
Lymphs Abs: 2366 cells/uL (ref 850–3900)
MCH: 28.6 pg (ref 27.0–33.0)
MCHC: 33.3 g/dL (ref 32.0–36.0)
MCV: 85.8 fL (ref 80.0–100.0)
MONOS PCT: 7.8 %
MPV: 10.5 fL (ref 7.5–12.5)
NEUTROS PCT: 40.4 %
Neutro Abs: 2101 cells/uL (ref 1500–7800)
PLATELETS: 229 10*3/uL (ref 140–400)
RBC: 4.23 10*6/uL (ref 4.20–5.80)
RDW: 12.7 % (ref 11.0–15.0)
TOTAL LYMPHOCYTE: 45.5 %
WBC mixed population: 406 cells/uL (ref 200–950)
WBC: 5.2 10*3/uL (ref 3.8–10.8)

## 2017-07-26 LAB — HEPATIC FUNCTION PANEL
AG Ratio: 1.5 (calc) (ref 1.0–2.5)
ALBUMIN MSPROF: 4.4 g/dL (ref 3.6–5.1)
ALKALINE PHOSPHATASE (APISO): 91 U/L (ref 40–115)
ALT: 10 U/L (ref 9–46)
AST: 16 U/L (ref 10–35)
BILIRUBIN TOTAL: 0.6 mg/dL (ref 0.2–1.2)
Bilirubin, Direct: 0.1 mg/dL (ref 0.0–0.2)
Globulin: 2.9 g/dL (calc) (ref 1.9–3.7)
Indirect Bilirubin: 0.5 mg/dL (calc) (ref 0.2–1.2)
TOTAL PROTEIN: 7.3 g/dL (ref 6.1–8.1)

## 2017-07-26 LAB — IRON,TIBC AND FERRITIN PANEL
%SAT: 30 % (calc) (ref 15–60)
FERRITIN: 311 ng/mL (ref 20–380)
IRON: 96 ug/dL (ref 50–180)
TIBC: 316 mcg/dL (calc) (ref 250–425)

## 2017-07-26 LAB — LIPID PANEL
CHOLESTEROL: 258 mg/dL — AB (ref <200)
HDL: 65 mg/dL (ref >40)
LDL Cholesterol (Calc): 175 mg/dL (calc) — ABNORMAL HIGH
Non-HDL Cholesterol (Calc): 193 mg/dL (calc) — ABNORMAL HIGH (ref <130)
TRIGLYCERIDES: 75 mg/dL (ref <150)
Total CHOL/HDL Ratio: 4 (calc) (ref <5.0)

## 2017-07-26 LAB — TSH: TSH: 1.5 mIU/L (ref 0.40–4.50)

## 2017-07-29 ENCOUNTER — Encounter: Payer: Self-pay | Admitting: Gastroenterology

## 2017-07-29 ENCOUNTER — Encounter: Payer: Self-pay | Admitting: *Deleted

## 2017-07-29 ENCOUNTER — Other Ambulatory Visit: Payer: Self-pay | Admitting: *Deleted

## 2017-07-29 DIAGNOSIS — R110 Nausea: Secondary | ICD-10-CM

## 2017-07-29 DIAGNOSIS — D649 Anemia, unspecified: Secondary | ICD-10-CM

## 2017-07-29 MED ORDER — PEG 3350-KCL-NA BICARB-NACL 420 G PO SOLR: 4000.0000 mL | Freq: Once | ORAL | 0 refills | Status: AC

## 2017-07-29 NOTE — Progress Notes (Signed)
Please let patient know that he has persistent anemia, no evidence of iron deficiency, stool was heme negative. Last tcs 2010.   Would offer him TCS/EGD WITH PROPOFOL for anemia, nausea with Dr. Gala Romney

## 2017-07-29 NOTE — Progress Notes (Addendum)
Received copy of colonoscopy report dated 02/2009 done by Dr. Hildred Laser. Few diverticula seen, small submucosal lipoma in proximal tranverse colon. Consider next tcs in 2020.   Reviewed recent labs done 07/25/17:  Lab Results  Component Value Date   WBC 5.2 07/25/2017   HGB 12.1 (L) 07/25/2017   HCT 36.3 (L) 07/25/2017   MCV 85.8 07/25/2017   PLT 229 07/25/2017   Lab Results  Component Value Date   IRON 96 07/25/2017   TIBC 316 07/25/2017   FERRITIN 311 07/25/2017   Lab Results  Component Value Date   ALT 10 07/25/2017   AST 16 07/25/2017   BILITOT 0.6 07/25/2017   Lab Results  Component Value Date   TSH 1.50 07/25/2017    HEME NEGATIVE STOOL

## 2017-07-29 NOTE — Progress Notes (Signed)
LMOVM

## 2017-07-29 NOTE — Progress Notes (Signed)
Heme negative stool.  See addendum to my ov note for instructions

## 2017-07-29 NOTE — Progress Notes (Signed)
Patient called back. He is agreeable. He is scheduled for 08/22/17 at 10:15am. Prep sent into the pharmacy. Instructions mailed to patient for prep. Will call him once pre-op is scheduled

## 2017-07-30 ENCOUNTER — Encounter: Payer: Self-pay | Admitting: *Deleted

## 2017-07-30 NOTE — Progress Notes (Signed)
Pre-op scheduled for 08/19/17 at 10:00am. Letter mailed to pt. LMOVM

## 2017-07-30 NOTE — Progress Notes (Signed)
Noted, pt is scheduled.

## 2017-07-31 ENCOUNTER — Ambulatory Visit (INDEPENDENT_AMBULATORY_CARE_PROVIDER_SITE_OTHER): Payer: PPO | Admitting: Family Medicine

## 2017-07-31 ENCOUNTER — Encounter: Payer: Self-pay | Admitting: Family Medicine

## 2017-07-31 ENCOUNTER — Other Ambulatory Visit: Payer: Self-pay

## 2017-07-31 VITALS — BP 110/60 | HR 82 | Temp 98.0°F | Resp 16 | Ht 72.0 in | Wt 181.0 lb

## 2017-07-31 DIAGNOSIS — D649 Anemia, unspecified: Secondary | ICD-10-CM

## 2017-07-31 DIAGNOSIS — Z79899 Other long term (current) drug therapy: Secondary | ICD-10-CM | POA: Diagnosis not present

## 2017-07-31 DIAGNOSIS — E785 Hyperlipidemia, unspecified: Secondary | ICD-10-CM

## 2017-07-31 DIAGNOSIS — I10 Essential (primary) hypertension: Secondary | ICD-10-CM

## 2017-07-31 DIAGNOSIS — Z1159 Encounter for screening for other viral diseases: Secondary | ICD-10-CM | POA: Diagnosis not present

## 2017-07-31 DIAGNOSIS — M2042 Other hammer toe(s) (acquired), left foot: Secondary | ICD-10-CM

## 2017-07-31 HISTORY — DX: Encounter for screening for other viral diseases: Z11.59

## 2017-07-31 HISTORY — DX: Hyperlipidemia, unspecified: E78.5

## 2017-07-31 HISTORY — DX: Other hammer toe(s) (acquired), left foot: M20.42

## 2017-07-31 HISTORY — DX: Other long term (current) drug therapy: Z79.899

## 2017-07-31 NOTE — Progress Notes (Signed)
Patient ID: Charles Ayala, male    DOB: Jul 25, 1950, 67 y.o.   MRN: [id number]  Chief Complaint  Patient presents with  . Follow-up    Allergies Patient has no known allergies.  Subjective:   Charles Ayala is a 67 y.o. male who presents to [facility] today.  HPI Charles Ayala presents today for follow-up visit.  He reports that he is here to discuss his blood work that he had completed yesterday.  Specifically he is here to discuss his cholesterol.  His LDL yesterday was approximately 175.  This is an elevation from his last LDL prior to starting simvastatin.  He reports that he suspects his cholesterol is slightly higher because he really has not been taking the medication.  He reports that most nights he falls to sleep without taking his medication.  He reports that he does want to take the medicine and he understands that his cholesterol value should be lower.  He reports that he is taking his blood pressure medication.  Blood pressure running well at home.  Denies chest pain, shortness of breath, or swelling in his extremities.  Reports that he went to see the GI doctor and has an upcoming colonoscopy/upper endoscopy scheduled.  This is being done due to his history of anemia.  He did have blood counts checked yesterday which revealed that his hemoglobin was stable at 12.1.  His iron indices were checked which were within normal limits.  He reports he eats an iron rich diet.  Denies any melena, hematochezia, or bright red blood per rectum.    He reports that he wanted to talk to me about his toe today.  He reports that on his left foot his second toe curls and lays over top of his third toe.  He reports that this does cause some pressure sensation at times and makes his toenail hurt.  He reports that years ago something very heavy was dropped on his foot and injured his toenails.  He reports that since that time his toenails on the first 3 toes have been darker and thicker in  quality.    Past Medical History:  Diagnosis Date  . Arthritis   . History of stomach ulcers   . Hyperlipidemia   . Hypertension     Past Surgical History:  Procedure Laterality Date  . BACK SURGERY  1997   L4 L5  . CARPAL TUNNEL RELEASE    . COLONOSCOPY  02/2009   Dr. Laural Golden: few sigmoid diverticula, one at cecum, small submucosal lipoma at prox transverse colon    Family History  Problem Relation Age of Onset  . Parkinson's disease Mother   . Hypertension Mother   . Hypertension Father   . Cancer Sister        Chemo related lung problems  . Diabetes Son   . Colon cancer Neg Hx      Social History   Socioeconomic History  . Marital status: Married    Spouse name: None  . Number of children: None  . Years of education: None  . Highest education level: None  Social Needs  . Financial resource strain: None  . Food insecurity - worry: None  . Food insecurity - inability: None  . Transportation needs - medical: None  . Transportation needs - non-medical: None  Occupational History  . Occupation: retired  Tobacco Use  . Smoking status: Never Smoker  . Smokeless tobacco: Never Used  Substance and Sexual Activity  .  Alcohol use: No  . Drug use: No  . Sexual activity: Yes  Other Topics Concern  . None  Social History Narrative   Retired. Used to work at [employer]. Married. Lives in [place]. Used to live in [place]. Has four children. Enjoys gardening and playing with grandchildren.    Current Outpatient Medications on File Prior to Visit  Medication Sig Dispense Refill  . gabapentin (NEURONTIN) 300 MG capsule Take 300 mg by mouth 3 (three) times daily.     . hydrochlorothiazide (HYDRODIURIL) 25 MG tablet TAKE 1 TABLET BY MOUTH ONCE DAILY 90 tablet 0  . simvastatin (ZOCOR) 20 MG tablet Take 1 tablet (20 mg total) by mouth at bedtime. 90 tablet 3  . tamsulosin (FLOMAX) 0.4 MG CAPS capsule Take 1 capsule (0.4 mg total) daily by mouth. 90 capsule 3  .  traMADol (ULTRAM) 50 MG tablet Take by mouth every 6 (six) hours as needed.    Marland Kitchen HYDROcodone-acetaminophen (NORCO) 7.5-325 MG tablet Take 1 tablet by mouth 4 (four) times daily as needed.     No current facility-administered medications on file prior to visit.     Review of Systems  Constitutional: Negative for appetite change, chills, diaphoresis, fatigue and fever.  HENT: Negative for sinus pain.   Respiratory: Negative for cough, choking, chest tightness and shortness of breath.   Cardiovascular: Negative for chest pain, palpitations and leg swelling.  Gastrointestinal: Negative for abdominal pain, anal bleeding, blood in stool and constipation.  Musculoskeletal:       Patient reports that he does have some pain in his left toes that bothers him from time to time.  Skin: Negative for rash.  Neurological: Negative for numbness.  Hematological: Negative for adenopathy. Does not bruise/bleed easily.  Psychiatric/Behavioral: Negative for dysphoric mood, hallucinations and suicidal ideas. The patient is not nervous/anxious.      Objective:   BP 110/60 (BP Location: Left Arm, Patient Position: Sitting, Cuff Size: Normal)   Pulse 82   Temp 98 F (36.7 C) (Temporal)   Resp 16   Ht 6' (1.829 m)   Wt 181 lb (82.1 kg)   SpO2 98%   BMI 24.55 kg/m   Physical Exam  Constitutional: He appears well-developed and well-nourished.  HENT:  Head: Normocephalic and atraumatic.  Eyes: EOM are normal. Pupils are equal, round, and reactive to light.  Neck: Normal range of motion. Neck supple.  Cardiovascular: Normal rate, regular rhythm and normal heart sounds.  Pulmonary/Chest: Effort normal and breath sounds normal. No respiratory distress.  Musculoskeletal:       Feet:  Left foot, second toe with hammertoe deformity, overlapping to third toe.  First 3 toes with darkened toenails with thickened nails and minimal caseous debris under nail.  Neurological: He is alert. No cranial nerve  deficit.  Skin: Skin is warm and dry.  Vitals reviewed.    Assessment and Plan  1. Hyperlipidemia LDL goal <100 Discussed with patient again why it controlling his cholesterol and decreasing his LDL is important.  We discussed atherosclerotic vascular disease and heart attack and stroke.  He reports that he would like to take the medicine at this time.  He does not need a refill.  He will follow back up in the lab in 3 months for a recheck of his cholesterol and liver tests. - Lipid Profile  2. HIgh risk medication use Despite having just had a liver test yesterday, he will need a repeat test in 3 months because he had not  been taking the medication.  In addition, he will need a BMP checked due to his diuretic use.. - Hepatic function panel - Basic metabolic panel  3. Encounter for hepatitis C screening test for low risk patient Screening test ordered and discussed with patient. - Hepatitis C antibody  4. Hammer toe of left foot - Ambulatory referral to Podiatry 5. Essential hypertension Blood pressure well controlled today.  Continue dietary modifications and monitoring of salt intake.  Continue HCTZ.  6. Normocytic anemia Discussed with patient today his iron panel and his CBC.  At this time we will plan on awaiting the colonoscopy and endoscopy.  His hemoglobin has been stable.  We will plan on checking a reticulocyte count with his next set of blood work in addition to a CBC.  If he remains anemic with negative endoscopies and normal iron indices.  Will likely refer to hematology oncology for evaluation.  This was discussed with patient today and he was in agreement with plan.  Return in about 3 months (around 10/28/2017). Caren Macadam, MD 07/31/2017

## 2017-08-14 NOTE — Patient Instructions (Signed)
Charles Ayala  08/14/2017     @PREFPERIOPPHARMACY @   Your procedure is scheduled on  08/22/2017 .  Report to [facility] at  745   A.M.  Call this number if you have problems the morning of surgery:  [phone]   Remember:  Do not eat food or drink liquids after midnight.  Take these medicines the morning of surgery with A SIP OF WATER  Neurontin, flomax, ultram.   Do not wear jewelry, make-up or nail polish.  Do not wear lotions, powders, or perfumes, or deodorant.  Do not shave 48 hours prior to surgery.  Men may shave face and neck.  Do not bring valuables to the hospital.  [facility] is not responsible for any belongings or valuables.  Contacts, dentures or bridgework may not be worn into surgery.  Leave your suitcase in the car.  After surgery it may be brought to your room.  For patients admitted to the hospital, discharge time will be determined by your treatment team.  Patients discharged the day of surgery will not be allowed to drive home.   Name and phone number of your driver:   family Special instructions:  Follow the diet and prep instructions given to you by Dr Roseanne Kaufman office.  Please read over the following fact sheets that you were given. Anesthesia Post-op Instructions and Care and Recovery After Surgery       Esophagogastroduodenoscopy Esophagogastroduodenoscopy (EGD) is a procedure to examine the lining of the esophagus, stomach, and first part of the small intestine (duodenum). This procedure is done to check for problems such as inflammation, bleeding, ulcers, or growths. During this procedure, a long, flexible, lighted tube with a camera attached (endoscope) is inserted down the throat. Tell a health care provider about:  Any allergies you have.  All medicines you are taking, including vitamins, herbs, eye drops, creams, and over-the-counter medicines.  Any problems you or family members have had with anesthetic  medicines.  Any blood disorders you have.  Any surgeries you have had.  Any medical conditions you have.  Whether you are pregnant or may be pregnant. What are the risks? Generally, this is a safe procedure. However, problems may occur, including:  Infection.  Bleeding.  A tear (perforation) in the esophagus, stomach, or duodenum.  Trouble breathing.  Excessive sweating.  Spasms of the larynx.  A slowed heartbeat.  Low blood pressure.  What happens before the procedure?  Follow instructions from your health care provider about eating or drinking restrictions.  Ask your health care provider about: ? Changing or stopping your regular medicines. This is especially important if you are taking diabetes medicines or blood thinners. ? Taking medicines such as aspirin and ibuprofen. These medicines can thin your blood. Do not take these medicines before your procedure if your health care provider instructs you not to.  Plan to have someone take you home after the procedure.  If you wear dentures, be ready to remove them before the procedure. What happens during the procedure?  To reduce your risk of infection, your health care team will wash or sanitize their hands.  An IV tube will be put in a vein in your hand or arm. You will get medicines and fluids through this tube.  You will be given one or more of the following: ? A medicine to help you relax (sedative). ? A medicine to numb the area (local anesthetic).  This medicine may be sprayed into your throat. It will make you feel more comfortable and keep you from gagging or coughing during the procedure. ? A medicine for pain.  A mouth guard may be placed in your mouth to protect your teeth and to keep you from biting on the endoscope.  You will be asked to lie on your left side.  The endoscope will be lowered down your throat into your esophagus, stomach, and duodenum.  Air will be put into the endoscope. This will  help your health care provider see better.  The lining of your esophagus, stomach, and duodenum will be examined.  Your health care provider may: ? Take a tissue sample so it can be looked at in a lab (biopsy). ? Remove growths. ? Remove objects (foreign bodies) that are stuck. ? Treat any bleeding with medicines or other devices that stop tissue from bleeding. ? Widen (dilate) or stretch narrowed areas of your esophagus and stomach.  The endoscope will be taken out. The procedure may vary among health care providers and hospitals. What happens after the procedure?  Your blood pressure, heart rate, breathing rate, and blood oxygen level will be monitored often until the medicines you were given have worn off.  Do not eat or drink anything until the numbing medicine has worn off and your gag reflex has returned. This information is not intended to replace advice given to you by your health care provider. Make sure you discuss any questions you have with your health care provider. Document Released: 10/05/2004 Document Revised: 11/10/2015 Document Reviewed: 04/28/2015 Elsevier Interactive Patient Education  2018 [employer]. Esophagogastroduodenoscopy, Care After Refer to this sheet in the next few weeks. These instructions provide you with information about caring for yourself after your procedure. Your health care provider may also give you more specific instructions. Your treatment has been planned according to current medical practices, but problems sometimes occur. Call your health care provider if you have any problems or questions after your procedure. What can I expect after the procedure? After the procedure, it is common to have:  A sore throat.  Nausea.  Bloating.  Dizziness.  Fatigue.  Follow these instructions at home:  Do not eat or drink anything until the numbing medicine (local anesthetic) has worn off and your gag reflex has returned. You will know that the  local anesthetic has worn off when you can swallow comfortably.  Do not drive for 24 hours if you received a medicine to help you relax (sedative).  If your health care provider took a tissue sample for testing during the procedure, make sure to get your test results. This is your responsibility. Ask your health care provider or the department performing the test when your results will be ready.  Keep all follow-up visits as told by your health care provider. This is important. Contact a health care provider if:  You cannot stop coughing.  You are not urinating.  You are urinating less than usual. Get help right away if:  You have trouble swallowing.  You cannot eat or drink.  You have throat or chest pain that gets worse.  You are dizzy or light-headed.  You faint.  You have nausea or vomiting.  You have chills.  You have a fever.  You have severe abdominal pain.  You have black, tarry, or bloody stools. This information is not intended to replace advice given to you by your health care provider. Make sure you discuss any questions  you have with your health care provider. Document Released: 05/21/2012 Document Revised: 11/10/2015 Document Reviewed: 04/28/2015 Elsevier Interactive Patient Education  2018 [employer].  Colonoscopy, Adult A colonoscopy is an exam to look at the large intestine. It is done to check for problems, such as:  Lumps (tumors).  Growths (polyps).  Swelling (inflammation).  Bleeding.  What happens before the procedure? Eating and drinking Follow instructions from your doctor about eating and drinking. These instructions may include:  A few days before the procedure - follow a low-fiber diet. ? Avoid nuts. ? Avoid seeds. ? Avoid dried fruit. ? Avoid raw fruits. ? Avoid vegetables.  1-3 days before the procedure - follow a clear liquid diet. Avoid liquids that have red or purple dye. Drink only clear liquids, such as: ? Clear broth  or bouillon. ? Black coffee or tea. ? Clear juice. ? Clear soft drinks or sports drinks. ? Gelatin dessert. ? Popsicles.  On the day of the procedure - do not eat or drink anything during the 2 hours before the procedure.  Bowel prep If you were prescribed an oral bowel prep:  Take it as told by your doctor. Starting the day before your procedure, you will need to drink a lot of liquid. The liquid will cause you to poop (have bowel movements) until your poop is almost clear or light green.  If your skin or butt gets irritated from diarrhea, you may: ? Wipe the area with wipes that have medicine in them, such as adult wet wipes with aloe and vitamin E. ? Put something on your skin that soothes the area, such as petroleum jelly.  If you throw up (vomit) while drinking the bowel prep, take a break for up to 60 minutes. Then begin the bowel prep again. If you keep throwing up and you cannot take the bowel prep without throwing up, call your doctor.  General instructions  Ask your doctor about changing or stopping your normal medicines. This is important if you take diabetes medicines or blood thinners.  Plan to have someone take you home from the hospital or clinic. What happens during the procedure?  An IV tube may be put into one of your veins.  You will be given medicine to help you relax (sedative).  To reduce your risk of infection: ? Your doctors will wash their hands. ? Your anal area will be washed with soap.  You will be asked to lie on your side with your knees bent.  Your doctor will get a long, thin, flexible tube ready. The tube will have a camera and a light on the end.  The tube will be put into your anus.  The tube will be gently put into your large intestine.  Air will be delivered into your large intestine to keep it open. You may feel some pressure or cramping.  The camera will be used to take photos.  A small tissue sample may be removed from your body  to be looked at under a microscope (biopsy). If any possible problems are found, the tissue will be sent to a lab for testing.  If small growths are found, your doctor may remove them and have them checked for cancer.  The tube that was put into your anus will be slowly removed. The procedure may vary among doctors and hospitals. What happens after the procedure?  Your doctor will check on you often until the medicines you were given have worn off.  Do not drive  for 24 hours after the procedure.  You may have a small amount of blood in your poop.  You may pass gas.  You may have mild cramps or bloating in your belly (abdomen).  It is up to you to get the results of your procedure. Ask your doctor, or the department performing the procedure, when your results will be ready. This information is not intended to replace advice given to you by your health care provider. Make sure you discuss any questions you have with your health care provider. Document Released: 07/07/2010 Document Revised: 04/04/2016 Document Reviewed: 08/16/2015 Elsevier Interactive Patient Education  2017 [employer]  Colonoscopy, Adult, Care After This sheet gives you information about how to care for yourself after your procedure. Your health care provider may also give you more specific instructions. If you have problems or questions, contact your health care provider. What can I expect after the procedure? After the procedure, it is common to have:  A small amount of blood in your stool for 24 hours after the procedure.  Some gas.  Mild abdominal cramping or bloating.  Follow these instructions at home: General instructions   For the first 24 hours after the procedure: ? Do not drive or use machinery. ? Do not sign important documents. ? Do not drink alcohol. ? Do your regular daily activities at a slower pace than normal. ? Eat soft, easy-to-digest foods. ? Rest often.  Take over-the-counter or  prescription medicines only as told by your health care provider.  It is up to you to get the results of your procedure. Ask your health care provider, or the department performing the procedure, when your results will be ready. Relieving cramping and bloating  Try walking around when you have cramps or feel bloated.  Apply heat to your abdomen as told by your health care provider. Use a heat source that your health care provider recommends, such as a moist heat pack or a heating pad. ? Place a towel between your skin and the heat source. ? Leave the heat on for 20-30 minutes. ? Remove the heat if your skin turns bright red. This is especially important if you are unable to feel pain, heat, or cold. You may have a greater risk of getting burned. Eating and drinking  Drink enough fluid to keep your urine clear or pale yellow.  Resume your normal diet as instructed by your health care provider. Avoid heavy or fried foods that are hard to digest.  Avoid drinking alcohol for as long as instructed by your health care provider. Contact a health care provider if:  You have blood in your stool 2-3 days after the procedure. Get help right away if:  You have more than a small spotting of blood in your stool.  You pass large blood clots in your stool.  Your abdomen is swollen.  You have nausea or vomiting.  You have a fever.  You have increasing abdominal pain that is not relieved with medicine. This information is not intended to replace advice given to you by your health care provider. Make sure you discuss any questions you have with your health care provider. Document Released: 01/17/2004 Document Revised: 02/27/2016 Document Reviewed: 08/16/2015 Elsevier Interactive Patient Education  2018 [place] Anesthesia is a term that refers to techniques, procedures, and medicines that help a person stay safe and comfortable during a medical procedure.  Monitored anesthesia care, or sedation, is one type of anesthesia. Your anesthesia  specialist may recommend sedation if you will be having a procedure that does not require you to be unconscious, such as:  Cataract surgery.  A dental procedure.  A biopsy.  A colonoscopy.  During the procedure, you may receive a medicine to help you relax (sedative). There are three levels of sedation:  Mild sedation. At this level, you may feel awake and relaxed. You will be able to follow directions.  Moderate sedation. At this level, you will be sleepy. You may not remember the procedure.  Deep sedation. At this level, you will be asleep. You will not remember the procedure.  The more medicine you are given, the deeper your level of sedation will be. Depending on how you respond to the procedure, the anesthesia specialist may change your level of sedation or the type of anesthesia to fit your needs. An anesthesia specialist will monitor you closely during the procedure. Let your health care provider know about:  Any allergies you have.  All medicines you are taking, including vitamins, herbs, eye drops, creams, and over-the-counter medicines.  Any use of steroids (by mouth or as a cream).  Any problems you or family members have had with sedatives and anesthetic medicines.  Any blood disorders you have.  Any surgeries you have had.  Any medical conditions you have, such as sleep apnea.  Whether you are pregnant or may be pregnant.  Any use of cigarettes, alcohol, or street drugs. What are the risks? Generally, this is a safe procedure. However, problems may occur, including:  Getting too much medicine (oversedation).  Nausea.  Allergic reaction to medicines.  Trouble breathing. If this happens, a breathing tube may be used to help with breathing. It will be removed when you are awake and breathing on your own.  Heart trouble.  Lung trouble.  Before the procedure Staying  hydrated Follow instructions from your health care provider about hydration, which may include:  Up to 2 hours before the procedure - you may continue to drink clear liquids, such as water, clear fruit juice, black coffee, and plain tea.  Eating and drinking restrictions Follow instructions from your health care provider about eating and drinking, which may include:  8 hours before the procedure - stop eating heavy meals or foods such as meat, fried foods, or fatty foods.  6 hours before the procedure - stop eating light meals or foods, such as toast or cereal.  6 hours before the procedure - stop drinking milk or drinks that contain milk.  2 hours before the procedure - stop drinking clear liquids.  Medicines Ask your health care provider about:  Changing or stopping your regular medicines. This is especially important if you are taking diabetes medicines or blood thinners.  Taking medicines such as aspirin and ibuprofen. These medicines can thin your blood. Do not take these medicines before your procedure if your health care provider instructs you not to.  Tests and exams  You will have a physical exam.  You may have blood tests done to show: ? How well your kidneys and liver are working. ? How well your blood can clot.  General instructions  Plan to have someone take you home from the hospital or clinic.  If you will be going home right after the procedure, plan to have someone with you for 24 hours.  What happens during the procedure?  Your blood pressure, heart rate, breathing, level of pain and overall condition will be monitored.  An IV tube will be  inserted into one of your veins.  Your anesthesia specialist will give you medicines as needed to keep you comfortable during the procedure. This may mean changing the level of sedation.  The procedure will be performed. After the procedure  Your blood pressure, heart rate, breathing rate, and blood oxygen level  will be monitored until the medicines you were given have worn off.  Do not drive for 24 hours if you received a sedative.  You may: ? Feel sleepy, clumsy, or nauseous. ? Feel forgetful about what happened after the procedure. ? Have a sore throat if you had a breathing tube during the procedure. ? Vomit. This information is not intended to replace advice given to you by your health care provider. Make sure you discuss any questions you have with your health care provider. Document Released: 02/28/2005 Document Revised: 11/11/2015 Document Reviewed: 09/25/2015 Elsevier Interactive Patient Education  2018 [place], Care After These instructions provide you with information about caring for yourself after your procedure. Your health care provider may also give you more specific instructions. Your treatment has been planned according to current medical practices, but problems sometimes occur. Call your health care provider if you have any problems or questions after your procedure. What can I expect after the procedure? After your procedure, it is common to:  Feel sleepy for several hours.  Feel clumsy and have poor balance for several hours.  Feel forgetful about what happened after the procedure.  Have poor judgment for several hours.  Feel nauseous or vomit.  Have a sore throat if you had a breathing tube during the procedure.  Follow these instructions at home: For at least 24 hours after the procedure:   Do not: ? Participate in activities in which you could fall or become injured. ? Drive. ? Use heavy machinery. ? Drink alcohol. ? Take sleeping pills or medicines that cause drowsiness. ? Make important decisions or sign legal documents. ? Take care of children on your own.  Rest. Eating and drinking  Follow the diet that is recommended by your health care provider.  If you vomit, drink water, juice, or soup when you can drink without  vomiting.  Make sure you have little or no nausea before eating solid foods. General instructions  Have a responsible adult stay with you until you are awake and alert.  Take over-the-counter and prescription medicines only as told by your health care provider.  If you smoke, do not smoke without supervision.  Keep all follow-up visits as told by your health care provider. This is important. Contact a health care provider if:  You keep feeling nauseous or you keep vomiting.  You feel light-headed.  You develop a rash.  You have a fever. Get help right away if:  You have trouble breathing. This information is not intended to replace advice given to you by your health care provider. Make sure you discuss any questions you have with your health care provider. Document Released: 09/25/2015 Document Revised: 01/25/2016 Document Reviewed: 09/25/2015 Elsevier Interactive Patient Education  [employer].

## 2017-08-15 ENCOUNTER — Telehealth: Payer: Self-pay | Admitting: *Deleted

## 2017-08-15 ENCOUNTER — Ambulatory Visit: Payer: PPO | Admitting: Podiatry

## 2017-08-15 ENCOUNTER — Ambulatory Visit (INDEPENDENT_AMBULATORY_CARE_PROVIDER_SITE_OTHER): Payer: PPO

## 2017-08-15 ENCOUNTER — Encounter: Payer: Self-pay | Admitting: Podiatry

## 2017-08-15 VITALS — BP 108/55 | HR 58 | Resp 16

## 2017-08-15 DIAGNOSIS — I739 Peripheral vascular disease, unspecified: Secondary | ICD-10-CM

## 2017-08-15 DIAGNOSIS — M2040 Other hammer toe(s) (acquired), unspecified foot: Secondary | ICD-10-CM

## 2017-08-15 DIAGNOSIS — L600 Ingrowing nail: Secondary | ICD-10-CM

## 2017-08-15 DIAGNOSIS — I999 Unspecified disorder of circulatory system: Secondary | ICD-10-CM

## 2017-08-15 DIAGNOSIS — R0989 Other specified symptoms and signs involving the circulatory and respiratory systems: Secondary | ICD-10-CM

## 2017-08-15 NOTE — Progress Notes (Signed)
   Subjective:    Patient ID: Charles Ayala, male    DOB: Nov 10, 1950, 67 y.o.   MRN: [id number]  HPI    Review of Systems  All other systems reviewed and are negative.      Objective:   Physical Exam        Assessment & Plan:

## 2017-08-15 NOTE — Progress Notes (Signed)
Subjective:   Patient ID: Charles Ayala, male   DOB: 67 y.o.   MRN: [id number]   HPI Patient presents stating that he has a painful ingrown toenail of his left third nail and also has vague pains in his right leg with a tentative diagnosis in the past of sciatica but it does make it difficult for him to walk and he gets cramping in his leg quite a bit of pain.  Patient does not smoke and likes to be active and does have also structural deformity he is concerned about   Review of Systems  All other systems reviewed and are negative.       Objective:  Physical Exam  Constitutional: He appears well-developed and well-nourished.  Cardiovascular: Intact distal pulses.  Pulmonary/Chest: Effort normal.  Musculoskeletal: Normal range of motion.  Neurological: He is alert.  Skin: Skin is warm.  Nursing note and vitals reviewed.   Neurovascular status was found to be intact left with diminished vascular status right with diminished pulses noted.  Patient has coolness to the right foot and on the left is found to have incurvated third nail lateral border that is painful when pressed with good digital perfusion of the left foot noted.  Patient has reduced range of motion first MPJ bilateral with spur formation dorsal surfaces and was noted to be well oriented x3 and does have caregiver with him today     Assessment:  Possibility for vascular disease right with ingrown toenail deformity left third digit with chronic hallux limitus condition bilateral     Plan:  H&P x-rays reviewed all conditions discussed.  I did do an ABI testing and found her to be peripheral disease right and we are going to refer to vascular doctor for evaluation and I do think it may be a part of the leg pain he has.  For the left he has excellent circulation I recommended removal of the ingrown toenail and he wants this done and I explained the risk of procedure.  I infiltrated 60 mg Xylocaine Marcaine mixture and sterile  prep applied with sterile instrumentation remove the lateral border third digit exposed matrix and applied phenol 3 applications 30 seconds followed by alcohol lavage sterile dressing.  Gave instructions on soaks and reappoint and also discussed the bunion deformity which I do not recommend procedures for currently  X-rays indicate there is spurring with arthritis around the big toe joint bilateral

## 2017-08-15 NOTE — Patient Instructions (Addendum)
Hammer Toe Hammer toe is a change in the shape (a deformity) of your second, third, or fourth toe. The deformity causes the middle joint of your toe to stay bent. This causes pain, especially when you are wearing shoes. Hammer toe starts gradually. At first, the toe can be straightened. Gradually over time, the deformity becomes stiff and permanent. Early treatments to keep the toe straight may relieve pain. As the deformity becomes stiff and permanent, surgery may be needed to straighten the toe. What are the causes? Hammer toe is caused by abnormal bending of the toe joint that is closest to your foot. It happens gradually over time. This pulls on the muscles and connections (tendons) of the toe joint, making them weak and stiff. It is often related to wearing shoes that are too short or narrow and do not let your toes straighten. What increases the risk? You may be at greater risk for hammer toe if you:  Are male.  Are older.  Wear shoes that are too small.  Wear high-heeled shoes that pinch your toes.  Are a ballet dancer.  Have a second toe that is longer than your big toe (first toe).  Injure your foot or toe.  Have arthritis.  Have a family history of hammer toe.  Have a nerve or muscle disorder.  What are the signs or symptoms? The main symptoms of this condition are pain and deformity of the toe. The pain is worse when wearing shoes, walking, or running. Other symptoms may include:  Corns or calluses over the bent part of the toe or between the toes.  Redness and a burning feeling on the toe.  An open sore that forms on the top of the toe.  Not being able to straighten the toe.  How is this diagnosed? This condition is diagnosed based on your symptoms and a physical exam. During the exam, your health care provider will try to straighten your toe to see how stiff the deformity is. You may also have tests, such as:  A blood test to check for rheumatoid  arthritis.  An X-ray to show how severe the deformity is.  How is this treated? Treatment for this condition will depend on how stiff the deformity is. Surgery is often needed. However, sometimes a hammer toe can be straightened without surgery. Treatments that do not involve surgery include:  Taping the toe into a straightened position.  Using pads and cushions to protect the toe (orthotics).  Wearing shoes that provide enough room for the toes.  Doing toe-stretching exercises at home.  Taking an NSAID to reduce pain and swelling.  If these treatments do not help or the toe cannot be straightened, surgery is the next option. The most common surgeries used to straighten a hammer toe include:  Arthroplasty. In this procedure, part of the joint is removed, and that allows the toe to straighten.  Fusion. In this procedure, cartilage between the two bones of the joint is taken out and the bones are fused together into one longer bone.  Implantation. In this procedure, part of the bone is removed and replaced with an implant to let the toe move again.  Flexor tendon transfer. In this procedure, the tendons that curl the toes down (flexor tendons) are repositioned.  Follow these instructions at home:  Take over-the-counter and prescription medicines only as told by your health care provider.  Do toe straightening and stretching exercises as told by your health care provider.  Keep all   follow-up visits as told by your health care provider. This is important. How is this prevented?  Wear shoes that give your toes enough room and do not cause pain.  Do not wear high-heeled shoes. Contact a health care provider if:  Your pain gets worse.  Your toe becomes red or swollen.  You develop an open sore on your toe. This information is not intended to replace advice given to you by your health care provider. Make sure you discuss any questions you have with your health care  provider. Document Released: 06/01/2000 Document Revised: 12/23/2015 Document Reviewed: 09/28/2015 Elsevier Interactive Patient Education  2018 [place] Instructions    THE DAY AFTER THE PROCEDURE  Place 1/4 cup of epsom salts in a quart of warm tap water.  Submerge your foot or feet with outer bandage intact for the initial soak; this will allow the bandage to become moist and wet for easy lift off.  Once you remove your bandage, continue to soak in the solution for 20 minutes.  This soak should be done twice a day.  Next, remove your foot or feet from solution, blot dry the affected area and cover.  You may use a band aid large enough to cover the area or use gauze and tape.  Apply other medications to the area as directed by the doctor such as polysporin neosporin.  IF YOUR SKIN BECOMES IRRITATED WHILE USING THESE INSTRUCTIONS, IT IS OKAY TO SWITCH TO  WHITE VINEGAR AND WATER. Or you may use antibacterial soap and water to keep the toe clean  Monitor for any signs/symptoms of infection. Call the office immediately if any occur or go directly to the emergency room. Call with any questions/concerns.    [place] Instructions-Post Nail Surgery  You have had your ingrown toenail and root treated with a chemical.  This chemical causes a burn that will drain and ooze like a blister.  This can drain for 6-8 weeks or longer.  It is important to keep this area clean, covered, and follow the soaking instructions dispensed at the time of your surgery.  This area will eventually dry and form a scab.  Once the scab forms you no longer need to soak or apply a dressing.  If at any time you experience an increase in pain, redness, swelling, or drainage, you should contact the office as soon as possible.

## 2017-08-16 ENCOUNTER — Encounter: Payer: Self-pay | Admitting: *Deleted

## 2017-08-16 NOTE — Telephone Encounter (Signed)
I informed pt of scheduled appts and mailed copy of appts and locations to pt's home address.

## 2017-08-16 NOTE — Telephone Encounter (Signed)
After multiple attempts to contact [facility] by dialing [phone], then hitting Redial, the phone message states "We are sorry, your call can not be completed as dialed. I refaxed with the correct NPI for [facility] [place] to [facility].

## 2017-08-16 NOTE — Telephone Encounter (Signed)
[facility] - [facility] gave NPI.

## 2017-08-16 NOTE — Telephone Encounter (Signed)
[facility] scheduled pt for arterial doppler 08/26/2017 at 10:00am to arrive 9:45am, and consultation with Dr. Fletcher Anon 09/03/2017 8:20am to arrive 8:00am. Faxed referral and orders to [facility].

## 2017-08-16 NOTE — Telephone Encounter (Signed)
Faxed required for Arterial Doppler prior approval to [facility].

## 2017-08-16 NOTE — Telephone Encounter (Signed)
[place] states NPI given is for [facility], need the NPI for [place]. I told her I thought we were under [facility]'s NPI, she said not, I told I would call for the NPI.

## 2017-08-19 ENCOUNTER — Encounter (HOSPITAL_COMMUNITY)
Admission: RE | Admit: 2017-08-19 | Discharge: 2017-08-19 | Disposition: A | Discharge status: Home / Self Care | Payer: PPO | Source: Ambulatory Visit | Attending: Internal Medicine | Admitting: Internal Medicine

## 2017-08-19 ENCOUNTER — Other Ambulatory Visit: Payer: Self-pay

## 2017-08-19 ENCOUNTER — Other Ambulatory Visit: Payer: Self-pay | Admitting: Podiatry

## 2017-08-19 ENCOUNTER — Encounter (HOSPITAL_COMMUNITY): Payer: Self-pay

## 2017-08-19 DIAGNOSIS — Z8719 Personal history of other diseases of the digestive system: Secondary | ICD-10-CM | POA: Diagnosis not present

## 2017-08-19 DIAGNOSIS — R0989 Other specified symptoms and signs involving the circulatory and respiratory systems: Secondary | ICD-10-CM

## 2017-08-19 DIAGNOSIS — Z79899 Other long term (current) drug therapy: Secondary | ICD-10-CM | POA: Diagnosis not present

## 2017-08-19 DIAGNOSIS — M1990 Unspecified osteoarthritis, unspecified site: Secondary | ICD-10-CM | POA: Diagnosis not present

## 2017-08-19 DIAGNOSIS — D124 Benign neoplasm of descending colon: Secondary | ICD-10-CM | POA: Diagnosis not present

## 2017-08-19 DIAGNOSIS — K5730 Diverticulosis of large intestine without perforation or abscess without bleeding: Secondary | ICD-10-CM | POA: Diagnosis not present

## 2017-08-19 DIAGNOSIS — R011 Cardiac murmur, unspecified: Secondary | ICD-10-CM | POA: Diagnosis not present

## 2017-08-19 DIAGNOSIS — D125 Benign neoplasm of sigmoid colon: Secondary | ICD-10-CM | POA: Diagnosis not present

## 2017-08-19 DIAGNOSIS — G629 Polyneuropathy, unspecified: Secondary | ICD-10-CM | POA: Diagnosis not present

## 2017-08-19 DIAGNOSIS — Q438 Other specified congenital malformations of intestine: Secondary | ICD-10-CM | POA: Diagnosis not present

## 2017-08-19 DIAGNOSIS — E785 Hyperlipidemia, unspecified: Secondary | ICD-10-CM | POA: Diagnosis not present

## 2017-08-19 DIAGNOSIS — R110 Nausea: Secondary | ICD-10-CM | POA: Diagnosis not present

## 2017-08-19 DIAGNOSIS — I10 Essential (primary) hypertension: Secondary | ICD-10-CM | POA: Diagnosis not present

## 2017-08-19 DIAGNOSIS — D649 Anemia, unspecified: Secondary | ICD-10-CM | POA: Diagnosis not present

## 2017-08-19 DIAGNOSIS — Z1211 Encounter for screening for malignant neoplasm of colon: Secondary | ICD-10-CM | POA: Diagnosis not present

## 2017-08-19 DIAGNOSIS — Z86010 Personal history of colonic polyps: Secondary | ICD-10-CM | POA: Diagnosis not present

## 2017-08-19 HISTORY — DX: Polyneuropathy, unspecified: G62.9

## 2017-08-19 HISTORY — DX: Cardiac murmur, unspecified: R01.1

## 2017-08-19 HISTORY — DX: Anemia, unspecified: D64.9

## 2017-08-19 LAB — BASIC METABOLIC PANEL
ANION GAP: 10 (ref 5–15)
BUN: 13 mg/dL (ref 6–20)
CO2: 28 mmol/L (ref 22–32)
Calcium: 9.3 mg/dL (ref 8.9–10.3)
Chloride: 101 mmol/L (ref 101–111)
Creatinine, Ser: 0.89 mg/dL (ref 0.61–1.24)
Glucose, Bld: 94 mg/dL (ref 65–99)
Potassium: 3.3 mmol/L — ABNORMAL LOW (ref 3.5–5.1)
Sodium: 139 mmol/L (ref 135–145)

## 2017-08-19 NOTE — Progress Notes (Signed)
Patient's potassium is slightly low.  Please have him take KCL 33meq orally twice daily for 3 days. #6. No refills.

## 2017-08-20 ENCOUNTER — Other Ambulatory Visit: Payer: Self-pay

## 2017-08-20 DIAGNOSIS — E876 Hypokalemia: Secondary | ICD-10-CM

## 2017-08-20 MED ORDER — POTASSIUM CHLORIDE 20 MEQ PO PACK: 20.0000 meq | PACK | Freq: Two times a day (BID) | ORAL | Status: AC

## 2017-08-22 ENCOUNTER — Ambulatory Visit (HOSPITAL_COMMUNITY): Payer: PPO | Admitting: Anesthesiology

## 2017-08-22 ENCOUNTER — Encounter (HOSPITAL_COMMUNITY): Payer: Self-pay | Admitting: *Deleted

## 2017-08-22 ENCOUNTER — Encounter (HOSPITAL_COMMUNITY)
Admission: RE | Disposition: A | Discharge status: Home / Self Care | Payer: Self-pay | Source: Ambulatory Visit | Attending: Internal Medicine

## 2017-08-22 ENCOUNTER — Ambulatory Visit (HOSPITAL_COMMUNITY)
Admission: RE | Admit: 2017-08-22 | Discharge: 2017-08-22 | Disposition: A | Discharge status: Home / Self Care | Payer: PPO | Source: Ambulatory Visit | Attending: Internal Medicine | Admitting: Internal Medicine

## 2017-08-22 DIAGNOSIS — I10 Essential (primary) hypertension: Secondary | ICD-10-CM | POA: Diagnosis not present

## 2017-08-22 DIAGNOSIS — D124 Benign neoplasm of descending colon: Secondary | ICD-10-CM | POA: Diagnosis not present

## 2017-08-22 DIAGNOSIS — Z8719 Personal history of other diseases of the digestive system: Secondary | ICD-10-CM | POA: Insufficient documentation

## 2017-08-22 DIAGNOSIS — G629 Polyneuropathy, unspecified: Secondary | ICD-10-CM | POA: Insufficient documentation

## 2017-08-22 DIAGNOSIS — K5730 Diverticulosis of large intestine without perforation or abscess without bleeding: Secondary | ICD-10-CM | POA: Diagnosis not present

## 2017-08-22 DIAGNOSIS — Q438 Other specified congenital malformations of intestine: Secondary | ICD-10-CM | POA: Diagnosis not present

## 2017-08-22 DIAGNOSIS — M1990 Unspecified osteoarthritis, unspecified site: Secondary | ICD-10-CM | POA: Diagnosis not present

## 2017-08-22 DIAGNOSIS — R011 Cardiac murmur, unspecified: Secondary | ICD-10-CM | POA: Diagnosis not present

## 2017-08-22 DIAGNOSIS — D649 Anemia, unspecified: Secondary | ICD-10-CM | POA: Diagnosis not present

## 2017-08-22 DIAGNOSIS — D125 Benign neoplasm of sigmoid colon: Secondary | ICD-10-CM

## 2017-08-22 DIAGNOSIS — Z1211 Encounter for screening for malignant neoplasm of colon: Secondary | ICD-10-CM | POA: Diagnosis not present

## 2017-08-22 DIAGNOSIS — Z79899 Other long term (current) drug therapy: Secondary | ICD-10-CM | POA: Insufficient documentation

## 2017-08-22 DIAGNOSIS — R110 Nausea: Secondary | ICD-10-CM | POA: Insufficient documentation

## 2017-08-22 DIAGNOSIS — E785 Hyperlipidemia, unspecified: Secondary | ICD-10-CM | POA: Insufficient documentation

## 2017-08-22 DIAGNOSIS — Z86010 Personal history of colonic polyps: Secondary | ICD-10-CM | POA: Diagnosis not present

## 2017-08-22 HISTORY — PX: POLYPECTOMY: SHX5525

## 2017-08-22 HISTORY — PX: COLONOSCOPY WITH PROPOFOL: SHX5780

## 2017-08-22 HISTORY — PX: ESOPHAGOGASTRODUODENOSCOPY (EGD) WITH PROPOFOL: SHX5813

## 2017-08-22 SURGERY — COLONOSCOPY WITH PROPOFOL
Anesthesia: Monitor Anesthesia Care

## 2017-08-22 MED ORDER — MIDAZOLAM HCL 2 MG/2ML IJ SOLN
INTRAMUSCULAR | Status: AC
  Filled 2017-08-22: qty 2

## 2017-08-22 MED ORDER — PROPOFOL 10 MG/ML IV BOLUS
INTRAVENOUS | Status: DC | PRN
  Administered 2017-08-22: 20 mg via INTRAVENOUS

## 2017-08-22 MED ORDER — FENTANYL CITRATE (PF) 100 MCG/2ML IJ SOLN
INTRAMUSCULAR | Status: AC
  Filled 2017-08-22: qty 2

## 2017-08-22 MED ORDER — PROPOFOL 10 MG/ML IV BOLUS
INTRAVENOUS | Status: AC
  Filled 2017-08-22: qty 40

## 2017-08-22 MED ORDER — LIDOCAINE VISCOUS 2 % MT SOLN
OROMUCOSAL | Status: AC
  Filled 2017-08-22: qty 15

## 2017-08-22 MED ORDER — LIDOCAINE VISCOUS 2 % MT SOLN
15.0000 mL | Freq: Once | OROMUCOSAL | Status: AC
  Administered 2017-08-22: 6 mL via OROMUCOSAL

## 2017-08-22 MED ORDER — CHLORHEXIDINE GLUCONATE CLOTH 2 % EX PADS: 6.0000 | MEDICATED_PAD | Freq: Once | CUTANEOUS | Status: DC

## 2017-08-22 MED ORDER — FENTANYL CITRATE (PF) 100 MCG/2ML IJ SOLN
25.0000 ug | Freq: Once | INTRAMUSCULAR | Status: AC
  Administered 2017-08-22: 25 ug via INTRAVENOUS

## 2017-08-22 MED ORDER — LACTATED RINGERS IV SOLN
INTRAVENOUS | Status: DC
  Administered 2017-08-22 (×2): via INTRAVENOUS

## 2017-08-22 MED ORDER — MIDAZOLAM HCL 2 MG/2ML IJ SOLN
1.0000 mg | INTRAMUSCULAR | Status: AC
  Administered 2017-08-22: 2 mg via INTRAVENOUS

## 2017-08-22 MED ORDER — PROPOFOL 500 MG/50ML IV EMUL
INTRAVENOUS | Status: DC | PRN
  Administered 2017-08-22: 150 ug/kg/min via INTRAVENOUS
  Administered 2017-08-22: 11:00:00 via INTRAVENOUS

## 2017-08-22 NOTE — Transfer of Care (Signed)
Immediate Anesthesia Transfer of Care Note  Patient: Charles Ayala  Procedure(s) Performed: COLONOSCOPY WITH PROPOFOL (N/A ) ESOPHAGOGASTRODUODENOSCOPY (EGD) WITH PROPOFOL (N/A ) POLYPECTOMY  Patient Location: PACU  Anesthesia Type:MAC  Level of Consciousness: awake  Airway & Oxygen Therapy: Patient Spontanous Breathing and Patient connected to nasal cannula oxygen  Post-op Assessment: Report given to RN  Post vital signs: Reviewed and stable  Last Vitals:  Vitals:   08/22/17 1010 08/22/17 1015  BP: (!) 147/70 (!) 148/69  Pulse:    Resp: (!) 25 15  Temp:    SpO2: 100% 100%    Last Pain:  Vitals:   08/22/17 0812  TempSrc: Oral         Complications: No apparent anesthesia complications

## 2017-08-22 NOTE — H&P (Signed)
@LOGO @   Primary Care Physician:  Caren Macadam, MD Primary Gastroenterologist:  Dr. Gala Romney  Pre-Procedure History & Physical: HPI:  Charles Ayala is a 67 y.o. male here for further evaluation of the nausea change in bowel habits. Bowel habit changes normalize. 17 years since he had a colonoscopy. EGD and colonoscopy planned today. No dysphagia.  Past Medical History:  Diagnosis Date  . Anemia   . Arthritis   . Heart murmur   . History of stomach ulcers   . Hyperlipidemia   . Hypertension   . Neuropathy     Past Surgical History:  Procedure Laterality Date  . BACK SURGERY  1997   L4 L5  . CARPAL TUNNEL RELEASE Right   . COLONOSCOPY  02/2009   Dr. Laural Golden: few sigmoid diverticula, one at cecum, small submucosal lipoma at prox transverse colon    Prior to Admission medications   Medication Sig Start Date End Date Taking? Authorizing Provider  gabapentin (NEURONTIN) 300 MG capsule Take 300 mg by mouth 3 (three) times daily.    Yes [provider]  hydrochlorothiazide (HYDRODIURIL) 25 MG tablet TAKE 1 TABLET BY MOUTH ONCE DAILY 05/21/17  Yes Hagler, Apolonio Schneiders, MD  simvastatin (ZOCOR) 20 MG tablet Take 1 tablet (20 mg total) by mouth at bedtime. 03/29/17  Yes Caren Macadam, MD  tamsulosin (FLOMAX) 0.4 MG CAPS capsule Take 1 capsule (0.4 mg total) daily by mouth. 04/30/17  Yes Hagler, Apolonio Schneiders, MD  traMADol (ULTRAM) 50 MG tablet Take 50-100 mg by mouth every 8 (eight) hours as needed (for pain.).    Yes [provider]  polyethylene glycol-electrolytes (NULYTELY/GOLYTELY) 420 g solution Take 4,000 mLs by mouth once. 07/30/17   [provider]    Allergies as of 07/29/2017  . (No Known Allergies)    Family History  Problem Relation Age of Onset  . Parkinson's disease Mother   . Hypertension Mother   . Hypertension Father   . Cancer Sister        Chemo related lung problems  . Diabetes Son   . Colon cancer Neg Hx     Social History   Socioeconomic  History  . Marital status: Married    Spouse name: Not on file  . Number of children: Not on file  . Years of education: Not on file  . Highest education level: Not on file  Social Needs  . Financial resource strain: Not on file  . Food insecurity - worry: Not on file  . Food insecurity - inability: Not on file  . Transportation needs - medical: Not on file  . Transportation needs - non-medical: Not on file  Occupational History  . Occupation: retired  Tobacco Use  . Smoking status: Never Smoker  . Smokeless tobacco: Never Used  Substance and Sexual Activity  . Alcohol use: No  . Drug use: No  . Sexual activity: Yes  Other Topics Concern  . Not on file  Social History Narrative   Retired. Used to work at [employer]. Married. Lives in [place]. Used to live in [place]. Has four children. Enjoys gardening and playing with grandchildren.     Review of Systems: See HPI, otherwise negative ROS  Physical Exam: BP (!) 159/76   Pulse 62   Temp 98.6 F (37 C) (Oral)   Resp 16   SpO2 100%  General:   Alert,  Well-developed, well-nourished, pleasant and cooperative in NAD SNeck:  Supple; no masses or thyromegaly. No significant cervical adenopathy. Lungs:  Clear throughout to auscultation.   No wheezes, crackles, or rhonchi. No acute distress. Heart:  Regular rate and rhythm; no murmurs, clicks, rubs,  or gallops. Abdomen: Non-distended, normal bowel sounds.  Soft and nontender without appreciable mass or hepatosplenomegaly.   Impression:  Pleasant 67 year old gentleman with significant nausea -  has improved dramatically recently. Change in bowel habits also improved. He is due for a colonoscopy  - screening.  Recommendations:  Per office plan I have offered the patient a diagnostic EGD and screening colonoscopy today.  The risks, benefits, limitations, imponderables and alternatives regarding both EGD and colonoscopy have been reviewed with the patient. Questions have been  answered. All parties agreeable.    Notice: This dictation was prepared with Dragon dictation along with smaller phrase technology. Any transcriptional errors that result from this process are unintentional and may not be corrected upon review.

## 2017-08-22 NOTE — Anesthesia Postprocedure Evaluation (Signed)
Anesthesia Post Note  Patient: Hannah Crill  Procedure(s) Performed: COLONOSCOPY WITH PROPOFOL (N/A ) ESOPHAGOGASTRODUODENOSCOPY (EGD) WITH PROPOFOL (N/A ) POLYPECTOMY  Patient location during evaluation: PACU Anesthesia Type: MAC Level of consciousness: awake and alert and oriented Pain management: pain level controlled Vital Signs Assessment: post-procedure vital signs reviewed and stable Respiratory status: spontaneous breathing Cardiovascular status: blood pressure returned to baseline and stable Postop Assessment: no apparent nausea or vomiting Anesthetic complications: no     Last Vitals:  Vitals:   08/22/17 1119 08/22/17 1131  BP: 108/70 122/66  Pulse: (!) 45 (!) 51  Resp: 16 (!) 21  Temp: 36.6 C   SpO2: 100% 100%    Last Pain:  Vitals:   08/22/17 0812  TempSrc: Oral                 Lacoya Wilbanks

## 2017-08-22 NOTE — Op Note (Signed)
[facility] Patient Name: Charles Ayala Procedure Date: 08/22/2017 9:57 AM MRN: [id number] Date of Birth: [birth year] Attending MD: Norvel Richards , MD CSN: [id number] Age: 67 Admit Type: Outpatient Procedure:                Upper GI endoscopy Indications:              Nausea Providers:                Norvel Richards, MD, Lurline Del, RN, Aram Candela Referring MD:              Medicines:                Propofol per Anesthesia Complications:            No immediate complications. Estimated Blood Loss:     Estimated blood loss: none. Procedure:                Pre-Anesthesia Assessment:                           - Prior to the procedure, a History and Physical                            was performed, and patient medications and                            allergies were reviewed. The patient's tolerance of                            previous anesthesia was also reviewed. The risks                            and benefits of the procedure and the sedation                            options and risks were discussed with the patient.                            All questions were answered, and informed consent                            was obtained. Prior Anticoagulants: The patient has                            taken no previous anticoagulant or antiplatelet                            agents. ASA Grade Assessment: II - A patient with                            mild systemic disease. After reviewing the risks  and benefits, the patient was deemed in                            satisfactory condition to undergo the procedure.                           After obtaining informed consent, the endoscope was                            passed under direct vision. Throughout the                            procedure, the patient's blood pressure, pulse, and                            oxygen saturations were monitored continuously. The                             [id number] ([id number]) scope was introduced through the                            and advanced to the second part of duodenum. The                            upper GI endoscopy was accomplished without                            difficulty. The patient tolerated the procedure                            well. Scope In: 10:28:08 AM Scope Out: 10:30:32 AM Total Procedure Duration: 0 hours 2 minutes 24 seconds  Findings:      The examined esophagus was normal.      The entire examined stomach was normal.      The duodenal bulb and second portion of the duodenum were normal. Impression:               - Normal esophagus.                           - Normal stomach.                           - Normal duodenal bulb and second portion of the                            duodenum.                           - No specimens collected. Moderate Sedation:      Moderate (conscious) sedation was personally administered by an       anesthesia professional. The following parameters were monitored: oxygen       saturation, heart rate, blood pressure, respiratory rate, EKG, adequacy       of pulmonary ventilation, and response to care. Total physician       intraservice  time was 8 minutes. Recommendation:           - Patient has a contact number available for                            emergencies. The signs and symptoms of potential                            delayed complications were discussed with the                            patient. Return to normal activities tomorrow.                            Written discharge instructions were provided to the                            patient.                           - Resume previous diet.                           - Continue present medications.                           - No repeat upper endoscopy.                           - Return to GI office in 3 months. See colonoscopy                            report Procedure Code(s):         --- Professional ---                           [phone], Esophagogastroduodenoscopy, flexible,                            transoral; diagnostic, including collection of                            specimen(s) by brushing or washing, when performed                            (separate procedure) Diagnosis Code(s):        --- Professional ---                           R11.0, Nausea CPT copyright 2016 American Medical Association. All rights reserved. The codes documented in this report are preliminary and upon coder review may  be revised to meet current compliance requirements. Cristopher Estimable. Sunita Demond, MD Norvel Richards, MD 08/22/2017 10:34:41 AM This report has been signed electronically. Number of Addenda: 0

## 2017-08-22 NOTE — Op Note (Signed)
[facility] Patient Name: Charles Ayala Procedure Date: 08/22/2017 10:31 AM MRN: [id number] Date of Birth: 1951-03-01 Attending MD: Norvel Richards , MD CSN: [id number] Age: 67 Admit Type: Outpatient Procedure:                Colonoscopy Indications:              Screening for colorectal malignant neoplasm, High                            risk colon cancer surveillance: Personal history of                            colonic polyps Providers:                Norvel Richards, MD, Lurline Del, RN, Aram Candela Referring MD:              Medicines:                Propofol per Anesthesia Complications:            No immediate complications. Estimated Blood Loss:     Estimated blood loss was minimal. Procedure:                Pre-Anesthesia Assessment:                           - Prior to the procedure, a History and Physical                            was performed, and patient medications and                            allergies were reviewed. The patient's tolerance of                            previous anesthesia was also reviewed. The risks                            and benefits of the procedure and the sedation                            options and risks were discussed with the patient.                            All questions were answered, and informed consent                            was obtained. Prior Anticoagulants: The patient has                            taken no previous anticoagulant or antiplatelet                            agents. ASA Grade  Assessment: II - A patient with                            mild systemic disease. After reviewing the risks                            and benefits, the patient was deemed in                            satisfactory condition to undergo the procedure.                           After obtaining informed consent, the colonoscope                            was passed under direct vision.  Throughout the                            procedure, the patient's blood pressure, pulse, and                            oxygen saturations were monitored continuously. The                            [id number] ([id number]) scope was introduced through                            the and advanced to the the cecum, identified by                            appendiceal orifice and ileocecal valve. The                            ileocecal valve, appendiceal orifice, and rectum                            were photographed. The colonoscopy was performed                            without difficulty. The patient tolerated the                            procedure well. The quality of the bowel                            preparation was adequate. Scope In: 10:35:55 AM Scope Out: 11:07:30 AM Scope Withdrawal Time: 0 hours 11 minutes 7 seconds  Total Procedure Duration: 0 hours 31 minutes 35 seconds  Findings:      The perianal and digital rectal examinations were normal.      Scattered medium-mouthed diverticula were found in the sigmoid colon. 1       cm lipoma present in the distal transverse colon.      The exam was otherwise without abnormality on direct and retroflexion       views.  Two semi-pedunculated polyps were found in the sigmoid colon and       descending colon. The polyps were 4 to 6 mm in size. These polyps were       removed with a cold snare. Resection and retrieval were complete.       Estimated blood loss was minimal. Impression:               - 2 polyps removed as described above., removed                            with a cold snare. Resected and retrieved.                           - Diverticulosis in the sigmoid colon. colonic                            lipoma                           - The examination was otherwise normal on direct                            and retroflexion views. Moderate Sedation:      Moderate (conscious) sedation was personally administered by an        anesthesia professional. The following parameters were monitored: oxygen       saturation, heart rate, blood pressure, respiratory rate, EKG, adequacy       of pulmonary ventilation, and response to care. Total physician       intraservice time was 45 minutes. Recommendation:           - Patient has a contact number available for                            emergencies. The signs and symptoms of potential                            delayed complications were discussed with the                            patient. Return to normal activities tomorrow.                            Written discharge instructions were provided to the                            patient.                           - Resume previous diet.                           - Continue present medications.                           - Repeat colonoscopy date to be determined after  pending pathology results are reviewed for                            surveillance based on pathology results.                           - Return to GI clinic in 3 months. see EGD report. Procedure Code(s):        --- Professional ---                           [phone], Colonoscopy, flexible; with removal of                            tumor(s), polyp(s), or other lesion(s) by snare                            technique Diagnosis Code(s):        --- Professional ---                           Z12.11, Encounter for screening for malignant                            neoplasm of colon                           Z86.010, Personal history of colonic polyps                           D12.4, Benign neoplasm of descending colon                           K57.30, Diverticulosis of large intestine without                            perforation or abscess without bleeding CPT copyright 2016 American Medical Association. All rights reserved. The codes documented in this report are preliminary and upon coder review may  be revised to meet  current compliance requirements. Cristopher Estimable. Krystiana Fornes, MD Norvel Richards, MD 08/22/2017 11:36:23 AM This report has been signed electronically. Number of Addenda: 0

## 2017-08-22 NOTE — Discharge Instructions (Signed)
PATIENT INSTRUCTIONS POST-ANESTHESIA  IMMEDIATELY FOLLOWING SURGERY:  Do not drive or operate machinery for the first twenty four hours after surgery.  Do not make any important decisions for twenty four hours after surgery or while taking narcotic pain medications or sedatives.  If you develop intractable nausea and vomiting or a severe headache please notify your doctor immediately.  FOLLOW-UP:  Please make an appointment with your surgeon as instructed. You do not need to follow up with anesthesia unless specifically instructed to do so.  WOUND CARE INSTRUCTIONS (if applicable):  Keep a dry clean dressing on the anesthesia/puncture wound site if there is drainage.  Once the wound has quit draining you may leave it open to air.  Generally you should leave the bandage intact for twenty four hours unless there is drainage.  If the epidural site drains for more than 36-48 hours please call the anesthesia department.  QUESTIONS?:  Please feel free to call your physician or the hospital operator if you have any questions, and they will be happy to assist you.       Colonoscopy Discharge Instructions  Read the instructions outlined below and refer to this sheet in the next few weeks. These discharge instructions provide you with general information on caring for yourself after you leave the hospital. Your doctor may also give you specific instructions. While your treatment has been planned according to the most current medical practices available, unavoidable complications occasionally occur. If you have any problems or questions after discharge, call Dr. Gala Romney at [phone]. ACTIVITY  You may resume your regular activity, but move at a slower pace for the next 24 hours.   Take frequent rest periods for the next 24 hours.   Walking will help get rid of the air and reduce the bloated feeling in your belly (abdomen).   No driving for 24 hours (because of the medicine (anesthesia) used during the  test).    Do not sign any important legal documents or operate any machinery for 24 hours (because of the anesthesia used during the test).  NUTRITION  Drink plenty of fluids.   You may resume your normal diet as instructed by your doctor.   Begin with a light meal and progress to your normal diet. Heavy or fried foods are harder to digest and may make you feel sick to your stomach (nauseated).   Avoid alcoholic beverages for 24 hours or as instructed.  MEDICATIONS  You may resume your normal medications unless your doctor tells you otherwise.  WHAT YOU CAN EXPECT TODAY  Some feelings of bloating in the abdomen.   Passage of more gas than usual.   Spotting of blood in your stool or on the toilet paper.  IF YOU HAD POLYPS REMOVED DURING THE COLONOSCOPY:  No aspirin products for 7 days or as instructed.   No alcohol for 7 days or as instructed.   Eat a soft diet for the next 24 hours.  FINDING OUT THE RESULTS OF YOUR TEST Not all test results are available during your visit. If your test results are not back during the visit, make an appointment with your caregiver to find out the results. Do not assume everything is normal if you have not heard from your caregiver or the medical facility. It is important for you to follow up on all of your test results.  SEEK IMMEDIATE MEDICAL ATTENTION IF:  You have more than a spotting of blood in your stool.   Your belly is swollen (  abdominal distention).   You are nauseated or vomiting.   You have a temperature over 101.   You have abdominal pain or discomfort that is severe or gets worse throughout the day.    Colon polyp and diverticulosis information provided  Further recommendations to follow pending review of pathology report  Office visit with [place] in 3 months   Colon Polyps Polyps are tissue growths inside the body. Polyps can grow in many places, including the large intestine (colon). A polyp may be a round bump or a  mushroom-shaped growth. You could have one polyp or several. Most colon polyps are noncancerous (benign). However, some colon polyps can become cancerous over time. What are the causes? The exact cause of colon polyps is not known. What increases the risk? This condition is more likely to develop in people who:  Have a family history of colon cancer or colon polyps.  Are older than 54 or older than 45 if they are African American.  Have inflammatory bowel disease, such as ulcerative colitis or Crohn disease.  Are overweight.  Smoke cigarettes.  Do not get enough exercise.  Drink too much alcohol.  Eat a diet that is: ? High in fat and red meat. ? Low in fiber.  Had childhood cancer that was treated with abdominal radiation.  What are the signs or symptoms? Most polyps do not cause symptoms. If you have symptoms, they may include:  Blood coming from your rectum when having a bowel movement.  Blood in your stool.The stool may look dark red or black.  A change in bowel habits, such as constipation or diarrhea.  How is this diagnosed? This condition is diagnosed with a colonoscopy. This is a procedure that uses a lighted, flexible scope to look at the inside of your colon. How is this treated? Treatment for this condition involves removing any polyps that are found. Those polyps will then be tested for cancer. If cancer is found, your health care provider will talk to you about options for colon cancer treatment. Follow these instructions at home: Diet  Eat plenty of fiber, such as fruits, vegetables, and whole grains.  Eat foods that are high in calcium and vitamin D, such as milk, cheese, yogurt, eggs, liver, fish, and broccoli.  Limit foods high in fat, red meats, and processed meats, such as hot dogs, sausage, bacon, and lunch meats.  Maintain a healthy weight, or lose weight if recommended by your health care provider. General instructions  Do not smoke  cigarettes.  Do not drink alcohol excessively.  Keep all follow-up visits as told by your health care provider. This is important. This includes keeping regularly scheduled colonoscopies. Talk to your health care provider about when you need a colonoscopy.  Exercise every day or as told by your health care provider. Contact a health care provider if:  You have new or worsening bleeding during a bowel movement.  You have new or increased blood in your stool.  You have a change in bowel habits.  You unexpectedly lose weight. This information is not intended to replace advice given to you by your health care provider. Make sure you discuss any questions you have with your health care provider.    Diverticulosis Diverticulosis is a condition that develops when small pouches (diverticula) form in the wall of the large intestine (colon). The colon is where water is absorbed and stool is formed. The pouches form when the inside layer of the colon pushes through weak spots  in the outer layers of the colon. You may have a few pouches or many of them. What are the causes? The cause of this condition is not known. What increases the risk? The following factors may make you more likely to develop this condition:  Being older than age 56. Your risk for this condition increases with age. Diverticulosis is rare among people younger than age 33. By age 57, many people have it.  Eating a low-fiber diet.  Having frequent constipation.  Being overweight.  Not getting enough exercise.  Smoking.  Taking over-the-counter pain medicines, like aspirin and ibuprofen.  Having a family history of diverticulosis.  What are the signs or symptoms? In most people, there are no symptoms of this condition. If you do have symptoms, they may include:  Bloating.  Cramps in the abdomen.  Constipation or diarrhea.  Pain in the lower left side of the abdomen.  How is this diagnosed? This condition is  most often diagnosed during an exam for other colon problems. Because diverticulosis usually has no symptoms, it often cannot be diagnosed independently. This condition may be diagnosed by:  Using a flexible scope to examine the colon (colonoscopy).  Taking an X-ray of the colon after dye has been put into the colon (barium enema).  Doing a CT scan.  How is this treated? You may not need treatment for this condition if you have never developed an infection related to diverticulosis. If you have had an infection before, treatment may include:  Eating a high-fiber diet. This may include eating more fruits, vegetables, and grains.  Taking a fiber supplement.  Taking a live bacteria supplement (probiotic).  Taking medicine to relax your colon.  Taking antibiotic medicines.  Follow these instructions at home:  Drink 6-8 glasses of water or more each day to prevent constipation.  Try not to strain when you have a bowel movement.  If you have had an infection before: ? Eat more fiber as directed by your health care provider or your diet and nutrition specialist (dietitian). ? Take a fiber supplement or probiotic, if your health care provider approves.  Take over-the-counter and prescription medicines only as told by your health care provider.  If you were prescribed an antibiotic, take it as told by your health care provider. Do not stop taking the antibiotic even if you start to feel better.  Keep all follow-up visits as told by your health care provider. This is important. Contact a health care provider if:  You have pain in your abdomen.  You have bloating.  You have cramps.  You have not had a bowel movement in 3 days. Get help right away if:  Your pain gets worse.  Your bloating becomes very bad.  You have a fever or chills, and your symptoms suddenly get worse.  You vomit.  You have bowel movements that are bloody or black.  You have bleeding from your  rectum. Summary  Diverticulosis is a condition that develops when small pouches (diverticula) form in the wall of the large intestine (colon).  You may have a few pouches or many of them.  This condition is most often diagnosed during an exam for other colon problems.  If you have had an infection related to diverticulosis, treatment may include increasing the fiber in your diet, taking supplements, or taking medicines. This information is not intended to replace advice given to you by your health care provider. Make sure you discuss any questions you have with your  health care provider. Marland Kitchen

## 2017-08-22 NOTE — OR Nursing (Signed)
Up to bathroom to void 

## 2017-08-22 NOTE — Anesthesia Preprocedure Evaluation (Signed)
Anesthesia Evaluation  Patient identified by MRN, date of birth, ID band Patient awake    Reviewed: Allergy & Precautions, NPO status , Patient's Chart, lab work & pertinent test results  Airway Mallampati: I  TM Distance: >3 FB Neck ROM: Full    Dental  (+) Teeth Intact, Partial Lower   Pulmonary neg pulmonary ROS,    breath sounds clear to auscultation       Cardiovascular hypertension, Pt. on medications  Rhythm:Regular Rate:Normal     Neuro/Psych negative neurological ROS  negative psych ROS   GI/Hepatic PUD,   Endo/Other    Renal/GU      Musculoskeletal   Abdominal   Peds  Hematology  (+) anemia ,   Anesthesia Other Findings   Reproductive/Obstetrics                             Anesthesia Physical Anesthesia Plan  ASA: II  Anesthesia Plan: MAC   Post-op Pain Management:    Induction:   PONV Risk Score and Plan:   Airway Management Planned: Simple Face Mask  Additional Equipment:   Intra-op Plan:   Post-operative Plan:   Informed Consent: I have reviewed the patients History and Physical, chart, labs and discussed the procedure including the risks, benefits and alternatives for the proposed anesthesia with the patient or authorized representative who has indicated his/her understanding and acceptance.     Plan Discussed with:   Anesthesia Plan Comments:         Anesthesia Quick Evaluation

## 2017-08-24 ENCOUNTER — Encounter: Payer: Self-pay | Admitting: Internal Medicine

## 2017-08-26 ENCOUNTER — Inpatient Hospital Stay (HOSPITAL_COMMUNITY)
Admission: RE | Admit: 2017-08-26 | Payer: PPO | Source: Ambulatory Visit | Attending: Cardiology | Admitting: Cardiology

## 2017-08-26 ENCOUNTER — Ambulatory Visit (INDEPENDENT_AMBULATORY_CARE_PROVIDER_SITE_OTHER): Payer: PPO | Admitting: Family Medicine

## 2017-08-26 ENCOUNTER — Encounter: Payer: Self-pay | Admitting: Family Medicine

## 2017-08-26 VITALS — BP 122/80 | HR 59 | Temp 97.2°F | Ht 72.0 in | Wt 185.8 lb

## 2017-08-26 DIAGNOSIS — L03032 Cellulitis of left toe: Secondary | ICD-10-CM | POA: Diagnosis not present

## 2017-08-26 DIAGNOSIS — G5711 Meralgia paresthetica, right lower limb: Secondary | ICD-10-CM | POA: Insufficient documentation

## 2017-08-26 MED ORDER — SULFAMETHOXAZOLE-TRIMETHOPRIM 800-160 MG PO TABS: 1.0000 | ORAL_TABLET | Freq: Two times a day (BID) | ORAL | 0 refills | Status: DC

## 2017-08-26 NOTE — Progress Notes (Signed)
Chief Complaint  Patient presents with  . Toe Pain    third small toe, bottom area    Mr. Kimmons is here for an acute visit. His usual PCP is Caren Macadam MD At her last visit was identified that he had a painful toe that needed attention.  She referred him to podiatry. The podiatrist saw him on 08/15/17 and evaluated.  He did x-rays.  He treated the third toe on the left foot for an ingrown toenail.  The toenail was surgically removed and then area treated with phenol to prevent regrowth.  He was given instructions to care for the toe with soaking and dressings.  He is here because his toe has become swollen, red, and painful and he has concern for infection.  Patient Active Problem List   Diagnosis Date Noted  . Meralgia paresthetica of right side 08/26/2017  . Hyperlipidemia LDL goal <100 07/31/2017  . High risk medication use 07/31/2017  . Encounter for hepatitis C screening test for low risk patient 07/31/2017  . Hammer toe of left foot 07/31/2017  . Normocytic anemia 07/22/2017  . Nausea without vomiting 07/22/2017  . Intervertebral disc disorders with myelopathy of lumbar region 02/26/2017  . Spondylolysis of lumbar region 02/26/2017  . Essential hypertension 02/22/2017  . Benign prostatic hyperplasia without lower urinary tract symptoms 02/22/2017    Outpatient Encounter Medications as of 08/26/2017  Medication Sig  . gabapentin (NEURONTIN) 300 MG capsule Take 300 mg by mouth 3 (three) times daily.   . hydrochlorothiazide (HYDRODIURIL) 25 MG tablet TAKE 1 TABLET BY MOUTH ONCE DAILY  . simvastatin (ZOCOR) 20 MG tablet Take 1 tablet (20 mg total) by mouth at bedtime.  . tamsulosin (FLOMAX) 0.4 MG CAPS capsule Take 1 capsule (0.4 mg total) daily by mouth.  . traMADol (ULTRAM) 50 MG tablet Take 50-100 mg by mouth every 8 (eight) hours as needed (for pain.).   Marland Kitchen polyethylene glycol-electrolytes (NULYTELY/GOLYTELY) 420 g solution Take 4,000 mLs by mouth once.  .  sulfamethoxazole-trimethoprim (BACTRIM DS,SEPTRA DS) 800-160 MG tablet Take 1 tablet by mouth 2 (two) times daily.   No facility-administered encounter medications on file as of 08/26/2017.     No Known Allergies  Review of Systems  Constitutional: Negative for chills, fatigue and fever.       No fever chills or malaise.  Musculoskeletal: Positive for back pain and gait problem.       Chronic back and leg pain.  Skin: Positive for color change and wound.       Redness and swelling of toe.  All other systems reviewed and are negative.   BP 122/80 (BP Location: Left Arm, Patient Position: Sitting, Cuff Size: Normal)   Pulse (!) 59   Temp (!) 97.2 F (36.2 C) (Temporal)   Ht 6' (1.829 m)   Wt 185 lb 12 oz (84.3 kg)   SpO2 97%   BMI 25.19 kg/m   Physical Exam  Constitutional: He is oriented to person, place, and time. He appears well-developed and well-nourished. No distress.  Musculoskeletal:       Feet:  Neurological: He is alert and oriented to person, place, and time.  Psychiatric: He has a normal mood and affect. His behavior is normal.    ASSESSMENT/PLAN:  1. Cellulitis of third toe of left foot The open skin is cleansed, coated with bacitracin, and covered with a nonstick Telfa dressing.  Gauze was then placed. Soaking and wound care is discussed with patient.  Demonstrated  to wife by  my LPN Antibiotics prescribed return within the week   Patient Instructions  Soak foot once a day, gently clean and then apply new dressing. Use bacitracin or antibiotic ointment and a non-stick gauze Try to avoid prolonged standing and walking Elevate foot if painful Take the antibiotic 2 times a day.  Make sure you take 2 doses today. Come back for follow-up later this week, at your convenience.   Raylene Everts, MD

## 2017-08-26 NOTE — Patient Instructions (Signed)
Soak foot once a day, gently clean and then apply new dressing. Use bacitracin or antibiotic ointment and a non-stick gauze Try to avoid prolonged standing and walking Elevate foot if painful Take the antibiotic 2 times a day.  Make sure you take 2 doses today. Come back for follow-up later this week, at your convenience.

## 2017-08-28 NOTE — OR Nursing (Signed)
Patient called and stated he has not had a BM since his procedure last Thursday. Patient denies abdominal pain and prior history of constipation. Dr. Gala Romney notified and said to instruct patient to take Miralax daily until he has a BM. Patient given phone number to the office and instructed to call if he has any further questions or problems. Patient verbalized understanding.

## 2017-08-29 DIAGNOSIS — M4306 Spondylolysis, lumbar region: Secondary | ICD-10-CM | POA: Diagnosis not present

## 2017-08-29 DIAGNOSIS — M5106 Intervertebral disc disorders with myelopathy, lumbar region: Secondary | ICD-10-CM | POA: Diagnosis not present

## 2017-08-30 ENCOUNTER — Ambulatory Visit (HOSPITAL_COMMUNITY)
Admission: RE | Admit: 2017-08-30 | Discharge: 2017-08-30 | Disposition: A | Discharge status: Home / Self Care | Payer: PPO | Source: Ambulatory Visit | Attending: Internal Medicine | Admitting: Internal Medicine

## 2017-08-30 DIAGNOSIS — I70202 Unspecified atherosclerosis of native arteries of extremities, left leg: Secondary | ICD-10-CM | POA: Diagnosis not present

## 2017-08-30 DIAGNOSIS — R0989 Other specified symptoms and signs involving the circulatory and respiratory systems: Secondary | ICD-10-CM | POA: Diagnosis not present

## 2017-09-02 ENCOUNTER — Encounter: Payer: Self-pay | Admitting: Family Medicine

## 2017-09-02 ENCOUNTER — Ambulatory Visit (INDEPENDENT_AMBULATORY_CARE_PROVIDER_SITE_OTHER): Payer: PPO | Admitting: Family Medicine

## 2017-09-02 ENCOUNTER — Other Ambulatory Visit: Payer: Self-pay

## 2017-09-02 DIAGNOSIS — D126 Benign neoplasm of colon, unspecified: Secondary | ICD-10-CM | POA: Diagnosis not present

## 2017-09-02 NOTE — Progress Notes (Signed)
    Chief Complaint  Patient presents with  . Follow-up   [name] is here for an follow-up visit. His usual PCP is Charles Macadam MD He was seen last week for cellulitis.  He was started on Septra DS.  He has been compliant with medication.  No side effects.  He took his last dose this morning.  He states the swelling pain and drainage in his toe is much improved.  Patient Active Problem List   Diagnosis Date Noted  . Tubular adenoma of colon 09/02/2017  . Meralgia paresthetica of right side 08/26/2017  . Hyperlipidemia LDL goal <100 07/31/2017  . High risk medication use 07/31/2017  . Encounter for hepatitis C screening test for low risk patient 07/31/2017  . Hammer toe of left foot 07/31/2017  . Normocytic anemia 07/22/2017  . Nausea without vomiting 07/22/2017  . Intervertebral disc disorders with myelopathy of lumbar region 02/26/2017  . Spondylolysis of lumbar region 02/26/2017  . Essential hypertension 02/22/2017  . Benign prostatic hyperplasia without lower urinary tract symptoms 02/22/2017    Outpatient Encounter Medications as of 09/02/2017  Medication Sig  . gabapentin (NEURONTIN) 300 MG capsule Take 300 mg by mouth 3 (three) times daily.   . hydrochlorothiazide (HYDRODIURIL) 25 MG tablet TAKE 1 TABLET BY MOUTH ONCE DAILY  . simvastatin (ZOCOR) 20 MG tablet Take 1 tablet (20 mg total) by mouth at bedtime.  . tamsulosin (FLOMAX) 0.4 MG CAPS capsule Take 1 capsule (0.4 mg total) daily by mouth.  . traMADol (ULTRAM) 50 MG tablet Take 50-100 mg by mouth every 8 (eight) hours as needed (for pain.).   Marland Kitchen polyethylene glycol-electrolytes (NULYTELY/GOLYTELY) 420 g solution Take 4,000 mLs by mouth once.  . [DISCONTINUED] sulfamethoxazole-trimethoprim (BACTRIM DS,SEPTRA DS) 800-160 MG tablet Take 1 tablet by mouth 2 (two) times daily. (Patient not taking: Reported on 09/02/2017)   No facility-administered encounter medications on file as of 09/02/2017.     No Known  Allergies  Review of Systems  Constitutional: Negative for chills, fatigue and fever.       No fever chills or malaise.  Musculoskeletal: Positive for back pain and gait problem.       Chronic back and leg pain.  Skin: Positive for color change and wound.       Redness and swelling of toe-much improved.  Still slightly swollen   All other systems reviewed and are negative.   BP (!) 104/58   Pulse (!) 51   Temp 98.9 F (37.2 C)   Ht 6' (1.829 m)   Wt 182 lb 0.6 oz (82.6 kg)   SpO2 97%   BMI 24.69 kg/m   Physical Exam  Constitutional: He is oriented to person, place, and time. He appears well-developed and well-nourished. No distress.  Musculoskeletal:       Feet:  Neurological: He is alert and oriented to person, place, and time.  Psychiatric: He has a normal mood and affect. His behavior is normal.    ASSESSMENT/PLAN:  1. Cellulitis of third toe of left foot Discussed current wound care includes daily cleansing, bacitracin, and Band-Aid.  Follow-up as needed.  Expect improvement at this point.   Patient Instructions  Continue to watch for infection Clean and put antibiotic/bandage daily  Call for problems  See Dr Charles Ayala on usual schedule   Charles Everts, MD

## 2017-09-02 NOTE — Patient Instructions (Signed)
Continue to watch for infection Clean and put antibiotic/bandage daily  Call for problems  See Dr Mannie Stabile on usual schedule

## 2017-09-03 ENCOUNTER — Encounter: Payer: Self-pay | Admitting: Cardiovascular Disease

## 2017-09-03 ENCOUNTER — Ambulatory Visit: Payer: PPO | Admitting: Cardiovascular Disease

## 2017-09-03 VITALS — BP 127/62 | HR 58 | Ht 72.0 in | Wt 183.4 lb

## 2017-09-03 DIAGNOSIS — I739 Peripheral vascular disease, unspecified: Secondary | ICD-10-CM | POA: Diagnosis not present

## 2017-09-03 DIAGNOSIS — I10 Essential (primary) hypertension: Secondary | ICD-10-CM

## 2017-09-03 DIAGNOSIS — E785 Hyperlipidemia, unspecified: Secondary | ICD-10-CM

## 2017-09-03 MED ORDER — ASPIRIN EC 81 MG PO TBEC: 81.0000 mg | DELAYED_RELEASE_TABLET | Freq: Every day | ORAL | 3 refills | Status: DC

## 2017-09-03 NOTE — Patient Instructions (Signed)
Medication Instructions: START 81 mg Aspirin daily  If you need a refill on your cardiac medications before your next appointment, please call your pharmacy.    Follow-Up: Your physician wants you to follow-up in 6 months with Dr. Fletcher Anon. You will receive a reminder letter in the mail two months in advance. If you don't receive a letter, please call our office at [phone] to schedule this follow-up appointment.   Thank you for choosing [facility] at [facility]!!

## 2017-09-03 NOTE — Progress Notes (Signed)
Cardiology Office Note   Date:  09/04/2017   ID:  Charles Ayala, DOB Oct 22, 1950, MRN [id number]  PCP:  Caren Macadam, MD  Cardiologist: Dr. Domenic Polite  No chief complaint on file.     History of Present Illness: Charles Ayala is a 67 y.o. male who was referred by Dr. Paulla Dolly  for evaluation and management of peripheral arterial disease.  The patient was seen a few months ago by Dr. Domenic Polite for evaluation of a heart murmur and was found to have mild aortic stenosis and moderate mitral regurgitation.  He has chronic medical conditions that include essential hypertension, hyperlipidemia and chronic back pain.  He is not diabetic and does not smoke.  Family history is negative for coronary artery disease.  He saw podiatry recently for some deformity in the left third toe with an ingrown nail.  He was noted to have decreased pulses on the right side with an abnormal ABI.  He underwent noninvasive vascular studies in our office which showed moderately reduced ABI on the right and 0.5 range with evidence of severe right SFA stenosis.  No significant disease on the left side. The patient reports a very mild bilateral calf discomfort with walking which does not affect his activities of daily living and does not forced him to stop and rest.  He does not feel limited by this.  He has no lower extremity ulceration on the right side.  No chest pain or shortness of breath.     Past Medical History:  Diagnosis Date  . Anemia   . Arthritis   . Heart murmur   . History of stomach ulcers   . Hyperlipidemia   . Hypertension   . Neuropathy     Past Surgical History:  Procedure Laterality Date  . BACK SURGERY  1997   L4 L5  . CARPAL TUNNEL RELEASE Right   . COLONOSCOPY  02/2009   Dr. Laural Golden: few sigmoid diverticula, one at cecum, small submucosal lipoma at prox transverse colon  . COLONOSCOPY WITH PROPOFOL N/A 08/22/2017   Procedure: COLONOSCOPY WITH PROPOFOL;  Surgeon: Daneil Dolin, MD;   [facility];  Service: Endoscopy;  Laterality: N/A;  10:15am  . ESOPHAGOGASTRODUODENOSCOPY (EGD) WITH PROPOFOL N/A 08/22/2017   Procedure: ESOPHAGOGASTRODUODENOSCOPY (EGD) WITH PROPOFOL;  Surgeon: Daneil Dolin, MD;  [facility];  Service: Endoscopy;  Laterality: N/A;  . POLYPECTOMY  08/22/2017   Procedure: POLYPECTOMY;  Surgeon: Daneil Dolin, MD;  [facility];  Service: Endoscopy;;  sigmoid     Current Outpatient Medications  Medication Sig Dispense Refill  . aspirin EC 81 MG tablet Take 1 tablet (81 mg total) by mouth daily. 90 tablet 3  . gabapentin (NEURONTIN) 300 MG capsule Take 300 mg by mouth 3 (three) times daily.     . hydrochlorothiazide (HYDRODIURIL) 25 MG tablet TAKE 1 TABLET BY MOUTH ONCE DAILY 90 tablet 0  . polyethylene glycol-electrolytes (NULYTELY/GOLYTELY) 420 g solution Take 4,000 mLs by mouth once.    . simvastatin (ZOCOR) 20 MG tablet Take 1 tablet (20 mg total) by mouth at bedtime. 90 tablet 3  . tamsulosin (FLOMAX) 0.4 MG CAPS capsule Take 1 capsule (0.4 mg total) daily by mouth. 90 capsule 3  . traMADol (ULTRAM) 50 MG tablet Take 50-100 mg by mouth every 8 (eight) hours as needed (for pain.).      No current facility-administered medications for this visit.     Allergies:   Patient has no known  allergies.    Social History:  The patient  reports that  has never smoked. he has never used smokeless tobacco. He reports that he does not drink alcohol or use drugs.   Family History:  The patient's family history includes Cancer in his sister; Diabetes in his son; Hypertension in his father and mother; Parkinson's disease in his mother.    ROS:  Please see the history of present illness.   Otherwise, review of systems are positive for none.   All other systems are reviewed and negative.    PHYSICAL EXAM: VS:  BP 127/62   Pulse (!) 58   Ht 6' (1.829 m)   Wt 183 lb 6.4 oz (83.2 kg)   BMI 24.87 kg/m  , BMI Body mass index is  24.87 kg/m. GEN: Well nourished, well developed, in no acute distress  HEENT: normal  Neck: no JVD, carotid bruits, or masses Cardiac: RRR; no  rubs, or gallops,no edema .  2 out of 6 crescendo decrescendo systolic murmur in the aortic area which is early peaking. Respiratory:  clear to auscultation bilaterally, normal work of breathing GI: soft, nontender, nondistended, + BS MS: no deformity or atrophy  Skin: warm and dry, no rash Neuro:  Strength and sensation are intact Psych: euthymic mood, full affect Vascular: Femoral pulses normal bilaterally.  Distal pulses are normal in the left side and not palpable on the right side   EKG:  EKG is not ordered today.    Recent Labs: 07/25/2017: ALT 10; Hemoglobin 12.1; Platelets 229; TSH 1.50 08/19/2017: BUN 13; Creatinine, Ser 0.89; Potassium 3.3; Sodium 139    Lipid Panel    Component Value Date/Time   CHOL 258 (H) 07/25/2017 0930   TRIG 75 07/25/2017 0930   HDL 65 07/25/2017 0930   CHOLHDL 4.0 07/25/2017 0930   LDLCALC 175 (H) 07/25/2017 0930      Wt Readings from Last 3 Encounters:  09/03/17 183 lb 6.4 oz (83.2 kg)  09/02/17 182 lb 0.6 oz (82.6 kg)  08/26/17 185 lb 12 oz (84.3 kg)       PAD Screen 05/07/2017  Previous PAD dx? No  Previous surgical procedure? No  Pain with walking? No  Feet/toe relief with dangling? No  Painful, non-healing ulcers? No  Extremities discolored? No      ASSESSMENT AND PLAN:  1.  Peripheral arterial disease: Significant right SFA stenosis with moderately reduced ABI.  The patient currently has very mild calf claudication which does not seem to be lifestyle limiting.  He is able to walk as long as he wants with no significant symptoms.  Thus, no indication for revascularization.  I discussed with him the natural history and management of claudication.  I recommended adding aspirin 81 mg once daily.  Continue treatment of risk factors. I advised him to start walking on a regular  basis.  2.Mild aortic stenosis and moderate mitral regurgitation: Followed by Dr. Domenic Polite.  3.  Essential hypertension: Blood pressure is controlled.  4.  Hyperlipidemia: Given recent diagnosis of PID, I recommend a target LDL of less than 70.  He was recently started on simvastatin.    Disposition:   FU with me in 6 months  Signed,  Kathlyn Sacramento, MD  09/04/2017 6:42 PM    [facility]

## 2017-10-10 ENCOUNTER — Encounter: Payer: Self-pay | Admitting: Internal Medicine

## 2017-11-21 ENCOUNTER — Encounter: Payer: Self-pay | Admitting: Family Medicine

## 2017-11-21 ENCOUNTER — Ambulatory Visit (INDEPENDENT_AMBULATORY_CARE_PROVIDER_SITE_OTHER): Payer: PPO | Admitting: Family Medicine

## 2017-11-21 ENCOUNTER — Other Ambulatory Visit: Payer: Self-pay

## 2017-11-21 VITALS — BP 138/58 | HR 52 | Temp 97.8°F | Resp 16 | Ht 72.0 in | Wt 173.0 lb

## 2017-11-21 DIAGNOSIS — G629 Polyneuropathy, unspecified: Secondary | ICD-10-CM

## 2017-11-21 MED ORDER — GABAPENTIN 300 MG PO CAPS: ORAL_CAPSULE | ORAL | 0 refills | Status: DC

## 2017-11-21 NOTE — Patient Instructions (Signed)
Start with neurontin 1 in the am, 1 in the afternoon and 2 at night for one week Then go to neurontin 2 in the am, 1 in the afternoon, and 2 at night  Follow up in 1 month

## 2017-11-21 NOTE — Progress Notes (Signed)
Patient ID: Charles Ayala, male    DOB: [birth year], 67 y.o.   MRN: [id number]  Chief Complaint  Patient presents with  . Foot Pain    left foot. Saw specialist, still hurting    Allergies Patient has no known allergies.  Subjective:   Charles Ayala is a 67 y.o. male who presents to [facility] today.  HPI Charles Ayala presents today to discuss pain in the left third toe.  He reports that ever since he saw the podiatrist in February and a solution was placed on his toe that he has had chronic burning and pain in his toe and on the bottom of his left foot.  He reports that he was told this would go away in 2 months but he reports that it has persisted.  He reports that this pain is dull and burning in quality.  He reports that it bothers him during the day and at night.  He reports that it is affecting his quality of life.  He reports that he has not been walking or doing much exercise because wearing shoes that touch the toe seem to make it worse.  He reports that his toe is supersensitive and that even the bottom of his foot is sensitive.  He does not have any open sores on his feet.  He reports he has not had any trauma to the foot.  He denies any foot infection.  He reports that this all happened after he was seen at the podiatrist and they tried to do something with his ingrown toenail.  He is somewhat disgruntled today over the whole podiatry visit.  He reports that he never was told what they were going to put on his toes.  He reports that he did not want it done.  He reports that his toe was put behind a curtain and he could not see what they were doing to it.  He does report that he has had chronic nerve pain in his right foot but never had it in his left foot until February.  He reports that he does take Neurontin 3 times a day for the nerve pain in his right foot.  He reports that now his left foot is worse than his right foot.   Past Medical History:  Diagnosis Date  .  Anemia   . Arthritis   . Heart murmur   . History of stomach ulcers   . Hyperlipidemia   . Hypertension   . Neuropathy     Past Surgical History:  Procedure Laterality Date  . BACK SURGERY  1997   L4 L5  . CARPAL TUNNEL RELEASE Right   . COLONOSCOPY  02/2009   Dr. Laural Golden: few sigmoid diverticula, one at cecum, small submucosal lipoma at prox transverse colon  . COLONOSCOPY WITH PROPOFOL N/A 08/22/2017   Procedure: COLONOSCOPY WITH PROPOFOL;  Surgeon: Daneil Dolin, MD;  [facility];  Service: Endoscopy;  Laterality: N/A;  10:15am  . ESOPHAGOGASTRODUODENOSCOPY (EGD) WITH PROPOFOL N/A 08/22/2017   Procedure: ESOPHAGOGASTRODUODENOSCOPY (EGD) WITH PROPOFOL;  Surgeon: Daneil Dolin, MD;  [facility];  Service: Endoscopy;  Laterality: N/A;  . POLYPECTOMY  08/22/2017   Procedure: POLYPECTOMY;  Surgeon: Daneil Dolin, MD;  [facility];  Service: Endoscopy;;  sigmoid    Family History  Problem Relation Age of Onset  . Parkinson's disease Mother   . Hypertension Mother   . Hypertension Father   . Cancer Sister  Chemo related lung problems  . Diabetes Son   . Colon cancer Neg Hx      Social History   Socioeconomic History  . Marital status: Married    Spouse name: Not on file  . Number of children: Not on file  . Years of education: Not on file  . Highest education level: Not on file  Occupational History  . Occupation: retired  Scientific laboratory technician  . Financial resource strain: Not on file  . Food insecurity:    Worry: Not on file    Inability: Not on file  . Transportation needs:    Medical: Not on file    Non-medical: Not on file  Tobacco Use  . Smoking status: Never Smoker  . Smokeless tobacco: Never Used  Substance and Sexual Activity  . Alcohol use: No  . Drug use: No  . Sexual activity: Yes  Lifestyle  . Physical activity:    Days per week: Not on file    Minutes per session: Not on file  . Stress: Not on file    Relationships  . Social connections:    Talks on phone: Not on file    Gets together: Not on file    Attends religious service: Not on file    Active member of club or organization: Not on file    Attends meetings of clubs or organizations: Not on file    Relationship status: Not on file  Other Topics Concern  . Not on file  Social History Narrative   Retired. Used to work at [employer]. Married. Lives in [place]. Used to live in [place]. Has four children. Enjoys gardening and playing with grandchildren.    Current Outpatient Medications on File Prior to Visit  Medication Sig Dispense Refill  . aspirin EC 81 MG tablet Take 1 tablet (81 mg total) by mouth daily. 90 tablet 3  . gabapentin (NEURONTIN) 300 MG capsule Take 300 mg by mouth 3 (three) times daily.     . hydrochlorothiazide (HYDRODIURIL) 25 MG tablet TAKE 1 TABLET BY MOUTH ONCE DAILY 90 tablet 0  . HYDROcodone-acetaminophen (NORCO) 7.5-325 MG tablet Take 1-2 tablets by mouth every 6 (six) hours as needed.    . simvastatin (ZOCOR) 20 MG tablet Take 1 tablet (20 mg total) by mouth at bedtime. 90 tablet 3  . tamsulosin (FLOMAX) 0.4 MG CAPS capsule Take 1 capsule (0.4 mg total) daily by mouth. 90 capsule 3  . traMADol (ULTRAM) 50 MG tablet Take 50-100 mg by mouth every 8 (eight) hours as needed (for pain.).      No current facility-administered medications on file prior to visit.     Review of Systems  Constitutional: Negative for activity change, appetite change and fatigue.  HENT: Negative for trouble swallowing and voice change.   Respiratory: Negative for cough and chest tightness.   Cardiovascular: Negative for chest pain and leg swelling.  Gastrointestinal: Negative for abdominal pain, constipation, diarrhea and nausea.  Musculoskeletal: Negative for myalgias.  Neurological: Positive for numbness.       Numbness and burning feeling in feet bilaterally.  Worse at left third toe and on the bottom of his foot.   Psychiatric/Behavioral: Negative for behavioral problems, decreased concentration and suicidal ideas. The patient is not nervous/anxious.      Objective:   BP (!) 138/58 (BP Location: Left Arm, Patient Position: Sitting, Cuff Size: Large)   Pulse (!) 52   Temp 97.8 F (36.6 C) (Temporal)   Resp 16  Ht 6' (1.829 m)   Wt 173 lb (78.5 kg)   SpO2 97%   BMI 23.46 kg/m   Physical Exam  Constitutional: He appears well-developed and well-nourished.  Cardiovascular: Normal rate and regular rhythm.  Pulses:      Dorsalis pedis pulses are 1+ on the left side.  Pulmonary/Chest: Effort normal and breath sounds normal.  Musculoskeletal:       Left foot: There is deformity.  Feet:  Left Foot:  Skin Integrity: Negative for ulcer, blister, skin breakdown, erythema, warmth, callus or dry skin.  Skin: Skin is warm and dry.  Left third toe with bony deformity.  Hyperpigmentation of skin surrounding lateral nail bed.  1+ dorsalis pedis pulses in left foot.  Warm extremities bilaterally.  No open sores or skin lesions on feet bilaterally.  Psychiatric: His behavior is normal. Judgment and thought content normal.     Assessment and Plan   1. Neuropathy Long discussion with patient and his wife today regarding the events surrounding the podiatry visit.  Podiatry note from February 2018 from Dr. Paulla Dolly was reviewed today in the office.  Discussed with patient that he obviously had an ingrown toenail and he had an ingrown toenail removal with phenol application to the nailbed.  Patient reports that he never had symptoms until this was done.  I discussed with patient that I do not know if his symptoms were caused by the phenol or if they just occurred after the procedure.  His symptoms are consistent with neuropathy.  At this time he defers a referral to podiatry.  He defers a referral to neurology.  He reports that he has had nerve conduction studies in the past and there is no way that he would go  through that again.  He is willing to increase the Neurontin.  At this time we will increase to 600 mg in the evening.  He will continue the 300 mg in the morning and 300 mg midday.  After 1 week he will go up to 600 mg in the morning, and he will continue the 300 mg midday and 600 mg in the evening. He was counseled current concerning possible side effects and sedation risks associated with the increase of this medication.  He voiced understanding.  He was told at any point if he would like a referral to podiatry or if he would like a referral to neurology for further evaluation of this pain in his feet that I would be happy to do that.  He defers referral and would like to just follow-up within the next month.  He was told again that he could call with any questions or concerns. - gabapentin (NEURONTIN) 300 MG capsule; Take 2 pills in the am, 1 pill in the afternoon, and 2 pills in the evening  Dispense: 150 capsule; Refill: 0   We also did discuss his weight loss today.  He reports he does not know why he is losing weight.  His wife reports he did does not eat as much as he used to.  We will monitor this at the next visit.  He has had blood counts and recent upper and lower endoscopy performed.  The results of the endoscopy and colonoscopy were discussed with patient.  He is supposed to follow-up with GI. Return in about 1 month (around 12/19/2017) for follow up. Caren Macadam, MD 11/21/2017

## 2017-11-22 ENCOUNTER — Encounter: Payer: Self-pay | Admitting: Family Medicine

## 2017-12-02 ENCOUNTER — Encounter (HOSPITAL_COMMUNITY): Payer: Self-pay

## 2017-12-03 DIAGNOSIS — M4306 Spondylolysis, lumbar region: Secondary | ICD-10-CM | POA: Diagnosis not present

## 2017-12-03 DIAGNOSIS — M5106 Intervertebral disc disorders with myelopathy, lumbar region: Secondary | ICD-10-CM | POA: Diagnosis not present

## 2017-12-04 ENCOUNTER — Telehealth: Payer: Self-pay | Admitting: Family Medicine

## 2017-12-04 DIAGNOSIS — R634 Abnormal weight loss: Secondary | ICD-10-CM

## 2017-12-04 NOTE — Telephone Encounter (Signed)
Patients wife Milda Smart called in requesting a sooner appt because patient is still loosing weight (he is 174lbs as of 12/03/17) Dr.Roy advised an [place] of his stomach to check his pancreas. Please advise. Cb#: [phone]/ [phone]

## 2017-12-09 NOTE — Telephone Encounter (Signed)
Advise that I put in referral to heme/oncology due to weight loss. In addition, he needs to follow up with GI, I had previously referred him there. He can also keep the follow up appointment with me, but needs to proceed with these first. Gwen Her. Mannie Stabile, MD

## 2017-12-09 NOTE — Telephone Encounter (Signed)
Spoke with wife and advised her of Dr.Hagler's recommendations. She said the patient has a GI appt in Sept. I told her to call them and tell them he is losing weight and get the appointment moved up sooner. She stated the cancer center called today and scheduled them an appointment. She verbalized understanding with everything she was told.

## 2017-12-11 ENCOUNTER — Encounter: Payer: Self-pay | Admitting: Gastroenterology

## 2017-12-11 ENCOUNTER — Ambulatory Visit: Payer: PPO | Admitting: Gastroenterology

## 2017-12-11 ENCOUNTER — Telehealth: Payer: Self-pay

## 2017-12-11 VITALS — BP 127/67 | HR 68 | Temp 97.2°F | Ht 72.0 in | Wt 175.8 lb

## 2017-12-11 DIAGNOSIS — D649 Anemia, unspecified: Secondary | ICD-10-CM | POA: Diagnosis not present

## 2017-12-11 DIAGNOSIS — R634 Abnormal weight loss: Secondary | ICD-10-CM

## 2017-12-11 DIAGNOSIS — K529 Noninfective gastroenteritis and colitis, unspecified: Secondary | ICD-10-CM

## 2017-12-11 NOTE — Patient Instructions (Signed)
For the frequent stools: you can try limiting the raisins. I have given you samples of Zenpep to take. Take two capsules with meals and 1 with snacks. Let me know how this works for you!  I have ordered a chest xray and a CT scan.   Further recommendations to follow!  It was a pleasure to see you today. I strive to create trusting relationships with patients to provide genuine, compassionate, and quality care. I value your feedback. If you receive a survey regarding your visit,  I greatly appreciate you taking time to fill this out.   Annitta Needs, PhD, ANP-BC [facility]

## 2017-12-11 NOTE — Telephone Encounter (Signed)
CT abd/pelvis w/contrast scheduled for 12/17/17 at 12:00pm, arrive at 11:45am. NPO 4 hours prior to test. Pick up contrast before day of test. Tried to call pt to inform of appt, no answer, LMOAM and LMOVM for return call.

## 2017-12-11 NOTE — Progress Notes (Signed)
Primary Care Physician:  Caren Macadam, MD Primary Gastroenterologist:  Dr. Gala Romney   Chief Complaint  Patient presents with  . Weight Loss  . dark stool  . Gas    since had tcs    HPI:   Charles Ayala is a 67 y.o. male presenting today in follow-up after TCS/EGD. History of anemia. Tubular adenomas on colonoscopy, normal EGD. Normocytic anemia without any evidence of IDA. Weight loss documented as well. Present with wife today, both concerned. His baseline weight has been 185 for some time, but now it is 175.   Used to weigh 230 but not sure how long ago. Our first weight was April 2018 at 24.   2006-2008, brother passed away, he regrets he did not call him back as brother had requested and has been upset about this over the years. Thinks weight was slowly creeping down then. However past year has had a notable decrease.   Appetite is good. Eats more than his wife. Snacking all the time. Rare nausea when cooking breakfast but this passes. No vomiting. No abdominal pain.   Three sisters passed away from cancer: one had lung cancer, another breast cancer and "something with stomach",   Past Medical History:  Diagnosis Date  . Anemia   . Arthritis   . Heart murmur   . History of stomach ulcers   . Hyperlipidemia   . Hypertension   . Neuropathy     Past Surgical History:  Procedure Laterality Date  . BACK SURGERY  1997   L4 L5  . CARPAL TUNNEL RELEASE Right   . COLONOSCOPY  02/2009   Dr. Laural Golden: few sigmoid diverticula, one at cecum, small submucosal lipoma at prox transverse colon  . COLONOSCOPY WITH PROPOFOL N/A 08/22/2017   Dr. Gala Romney: two tubular adenomas, sigmoid diverticulosis, colonic lipoma  . ESOPHAGOGASTRODUODENOSCOPY (EGD) WITH PROPOFOL N/A 08/22/2017   Dr. Gala Romney: normal esophagus, normal stomach, normal duodenum  . POLYPECTOMY  08/22/2017   Procedure: POLYPECTOMY;  Surgeon: Daneil Dolin, MD;  [facility];  Service: Endoscopy;;  sigmoid     Current Outpatient Medications  Medication Sig Dispense Refill  . aspirin EC 81 MG tablet Take 1 tablet (81 mg total) by mouth daily. 90 tablet 3  . gabapentin (NEURONTIN) 300 MG capsule Take 300 mg by mouth 3 (three) times daily.     Marland Kitchen gabapentin (NEURONTIN) 300 MG capsule Take 2 pills in the am, 1 pill in the afternoon, and 2 pills in the evening 150 capsule 0  . hydrochlorothiazide (HYDRODIURIL) 25 MG tablet TAKE 1 TABLET BY MOUTH ONCE DAILY 90 tablet 0  . HYDROcodone-acetaminophen (NORCO) 7.5-325 MG tablet Take 1-2 tablets by mouth every 6 (six) hours as needed.    . simvastatin (ZOCOR) 20 MG tablet Take 1 tablet (20 mg total) by mouth at bedtime. 90 tablet 3  . tamsulosin (FLOMAX) 0.4 MG CAPS capsule Take 1 capsule (0.4 mg total) daily by mouth. 90 capsule 3  . traMADol (ULTRAM) 50 MG tablet Take 50-100 mg by mouth every 8 (eight) hours as needed (for pain.).      No current facility-administered medications for this visit.     Allergies as of 12/11/2017  . (No Known Allergies)    Family History  Problem Relation Age of Onset  . Parkinson's disease Mother   . Hypertension Mother   . Hypertension Father   . Cancer Sister        Chemo related lung  problems  . Diabetes Son   . Colon cancer Neg Hx   . Liver disease Neg Hx   . Pancreatic cancer Neg Hx     Social History   Socioeconomic History  . Marital status: Married    Spouse name: Not on file  . Number of children: Not on file  . Years of education: Not on file  . Highest education level: Not on file  Occupational History  . Occupation: retired  Scientific laboratory technician  . Financial resource strain: Not on file  . Food insecurity:    Worry: Not on file    Inability: Not on file  . Transportation needs:    Medical: Not on file    Non-medical: Not on file  Tobacco Use  . Smoking status: Never Smoker  . Smokeless tobacco: Never Used  Substance and Sexual Activity  . Alcohol use: No  . Drug use: No  . Sexual  activity: Yes  Lifestyle  . Physical activity:    Days per week: Not on file    Minutes per session: Not on file  . Stress: Not on file  Relationships  . Social connections:    Talks on phone: Not on file    Gets together: Not on file    Attends religious service: Not on file    Active member of club or organization: Not on file    Attends meetings of clubs or organizations: Not on file    Relationship status: Not on file  . Intimate partner violence:    Fear of current or ex partner: Not on file    Emotionally abused: Not on file    Physically abused: Not on file    Forced sexual activity: Not on file  Other Topics Concern  . Not on file  Social History Narrative   Retired. Used to work at [employer]. Married. Lives in [place]. Used to live in [place]. Has four children. Enjoys gardening and playing with grandchildren.     Review of Systems: Gen: see HPI  CV: Denies chest pain, heart palpitations, peripheral edema, syncope.  Resp: Denies shortness of breath at rest or with exertion. Denies wheezing or cough.  GI: see HPI  GU : Denies urinary burning, urinary frequency, urinary hesitancy MS: Denies joint pain, muscle weakness, cramps, or limitation of movement.  Derm: Denies rash, itching, dry skin Psych: Denies depression, anxiety, memory loss, and confusion Heme: Denies bruising, bleeding, and enlarged lymph nodes.  Physical Exam: BP 127/67   Pulse 68   Temp (!) 97.2 F (36.2 C) (Oral)   Ht 6' (1.829 m)   Wt 175 lb 12.8 oz (79.7 kg)   BMI 23.84 kg/m  General:   Alert and oriented. Pleasant and cooperative. Well-nourished and well-developed.  Head:  Normocephalic and atraumatic. Eyes:  Without icterus, sclera clear and conjunctiva pink.  Ears:  Normal auditory acuity. Nose:  No deformity, discharge,  or lesions. Mouth:  No deformity or lesions, oral mucosa pink.  Lungs:  Clear to auscultation bilaterally. No wheezes, rales, or rhonchi. No distress.   Heart:  S1, S2 present without murmurs appreciated.  Abdomen:  +BS, soft, non-tender and non-distended. No HSM noted. No guarding or rebound. No masses appreciated.  Rectal:  Deferred  Msk:  Symmetrical without gross deformities. Normal posture. Extremities:  Without  edema. Neurologic:  Alert and  oriented x4 Psych:  Alert and cooperative. Normal mood and affect.   Lab Results  Component Value Date   WBC 5.2 07/25/2017  HGB 12.1 (L) 07/25/2017   HCT 36.3 (L) 07/25/2017   MCV 85.8 07/25/2017   PLT 229 07/25/2017   Lab Results  Component Value Date   IRON 96 07/25/2017   TIBC 316 07/25/2017   FERRITIN 311 07/25/2017

## 2017-12-12 NOTE — Telephone Encounter (Signed)
Called and informed pt of CT appt.

## 2017-12-13 ENCOUNTER — Ambulatory Visit (HOSPITAL_COMMUNITY)
Admission: RE | Admit: 2017-12-13 | Discharge: 2017-12-13 | Disposition: A | Discharge status: Home / Self Care | Payer: PPO | Source: Ambulatory Visit | Attending: Gastroenterology | Admitting: Gastroenterology

## 2017-12-13 DIAGNOSIS — I10 Essential (primary) hypertension: Secondary | ICD-10-CM | POA: Diagnosis not present

## 2017-12-13 DIAGNOSIS — R634 Abnormal weight loss: Secondary | ICD-10-CM | POA: Diagnosis not present

## 2017-12-15 ENCOUNTER — Encounter: Payer: Self-pay | Admitting: Gastroenterology

## 2017-12-17 ENCOUNTER — Ambulatory Visit (HOSPITAL_COMMUNITY)
Admission: RE | Admit: 2017-12-17 | Discharge: 2017-12-17 | Disposition: A | Discharge status: Home / Self Care | Payer: PPO | Source: Ambulatory Visit | Attending: Gastroenterology | Admitting: Gastroenterology

## 2017-12-17 DIAGNOSIS — K5730 Diverticulosis of large intestine without perforation or abscess without bleeding: Secondary | ICD-10-CM | POA: Diagnosis not present

## 2017-12-17 DIAGNOSIS — K760 Fatty (change of) liver, not elsewhere classified: Secondary | ICD-10-CM | POA: Insufficient documentation

## 2017-12-17 DIAGNOSIS — R634 Abnormal weight loss: Secondary | ICD-10-CM | POA: Insufficient documentation

## 2017-12-17 DIAGNOSIS — N400 Enlarged prostate without lower urinary tract symptoms: Secondary | ICD-10-CM | POA: Diagnosis not present

## 2017-12-17 LAB — POCT I-STAT CREATININE: Creatinine, Ser: 0.9 mg/dL (ref 0.61–1.24)

## 2017-12-17 MED ORDER — IOPAMIDOL (ISOVUE-300) INJECTION 61%
100.0000 mL | Freq: Once | INTRAVENOUS | Status: AC | PRN
  Administered 2017-12-17: 100 mL via INTRAVENOUS

## 2017-12-17 NOTE — Assessment & Plan Note (Signed)
At least 10 lbs in 1 year, with patient stating he used to weigh over 200 lbs. We do not have documentation of this. Colonoscopy/EGD on file. Ordering CXR and CT abd/pelvis for further evaluation.

## 2017-12-17 NOTE — Assessment & Plan Note (Signed)
No evidence of IDA. Colonoscopy/EGD on file.

## 2017-12-17 NOTE — Assessment & Plan Note (Signed)
Trial of Zenpep samples provided. Colonoscopy unrevealing. May need to trial Bentyl if enzymes without improvement.

## 2017-12-18 NOTE — Progress Notes (Signed)
cc'ed to pcp °

## 2017-12-23 NOTE — Progress Notes (Signed)
CT scan without occult malignancy. No acute abnormalities. He does have stool throughout the colon. Moderately enlarged prostate. Keep upcoming appt in September.

## 2017-12-23 NOTE — Progress Notes (Signed)
PT is aware.

## 2017-12-31 ENCOUNTER — Inpatient Hospital Stay (HOSPITAL_COMMUNITY): Payer: PPO | Attending: Internal Medicine | Admitting: Internal Medicine

## 2017-12-31 ENCOUNTER — Inpatient Hospital Stay (HOSPITAL_COMMUNITY): Payer: PPO

## 2017-12-31 ENCOUNTER — Encounter (HOSPITAL_COMMUNITY): Payer: Self-pay | Admitting: Internal Medicine

## 2017-12-31 VITALS — BP 141/65 | HR 100 | Temp 98.1°F | Resp 14 | Ht 70.5 in | Wt 175.3 lb

## 2017-12-31 DIAGNOSIS — N400 Enlarged prostate without lower urinary tract symptoms: Secondary | ICD-10-CM | POA: Diagnosis not present

## 2017-12-31 DIAGNOSIS — Z79899 Other long term (current) drug therapy: Secondary | ICD-10-CM

## 2017-12-31 DIAGNOSIS — K529 Noninfective gastroenteritis and colitis, unspecified: Secondary | ICD-10-CM

## 2017-12-31 DIAGNOSIS — E785 Hyperlipidemia, unspecified: Secondary | ICD-10-CM | POA: Diagnosis not present

## 2017-12-31 DIAGNOSIS — Z7982 Long term (current) use of aspirin: Secondary | ICD-10-CM | POA: Insufficient documentation

## 2017-12-31 DIAGNOSIS — K760 Fatty (change of) liver, not elsewhere classified: Secondary | ICD-10-CM

## 2017-12-31 DIAGNOSIS — M1990 Unspecified osteoarthritis, unspecified site: Secondary | ICD-10-CM | POA: Diagnosis not present

## 2017-12-31 DIAGNOSIS — D509 Iron deficiency anemia, unspecified: Secondary | ICD-10-CM | POA: Insufficient documentation

## 2017-12-31 DIAGNOSIS — Z8719 Personal history of other diseases of the digestive system: Secondary | ICD-10-CM

## 2017-12-31 DIAGNOSIS — I10 Essential (primary) hypertension: Secondary | ICD-10-CM | POA: Diagnosis not present

## 2017-12-31 DIAGNOSIS — R634 Abnormal weight loss: Secondary | ICD-10-CM

## 2017-12-31 DIAGNOSIS — R197 Diarrhea, unspecified: Secondary | ICD-10-CM | POA: Diagnosis not present

## 2017-12-31 LAB — COMPREHENSIVE METABOLIC PANEL
ALT: 13 U/L (ref 0–44)
AST: 21 U/L (ref 15–41)
Albumin: 4.2 g/dL (ref 3.5–5.0)
Alkaline Phosphatase: 90 U/L (ref 38–126)
Anion gap: 7 (ref 5–15)
BUN: 15 mg/dL (ref 8–23)
CHLORIDE: 101 mmol/L (ref 98–111)
CO2: 33 mmol/L — AB (ref 22–32)
CREATININE: 0.87 mg/dL (ref 0.61–1.24)
Calcium: 9.1 mg/dL (ref 8.9–10.3)
GFR calc non Af Amer: >60 mL/min (ref >60)
Glucose, Bld: 88 mg/dL (ref 70–99)
Potassium: 3.6 mmol/L (ref 3.5–5.1)
SODIUM: 141 mmol/L (ref 135–145)
Total Bilirubin: 0.6 mg/dL (ref 0.3–1.2)
Total Protein: 7.5 g/dL (ref 6.5–8.1)

## 2017-12-31 LAB — CBC WITH DIFFERENTIAL/PLATELET
Basophils Absolute: 0 10*3/uL (ref 0.0–0.1)
Basophils Relative: 1 %
EOS PCT: 4 %
Eosinophils Absolute: 0.2 10*3/uL (ref 0.0–0.7)
HCT: 37.3 % — ABNORMAL LOW (ref 39.0–52.0)
Hemoglobin: 12 g/dL — ABNORMAL LOW (ref 13.0–17.0)
LYMPHS ABS: 2.5 10*3/uL (ref 0.7–4.0)
Lymphocytes Relative: 45 %
MCH: 28.6 pg (ref 26.0–34.0)
MCHC: 32.2 g/dL (ref 30.0–36.0)
MCV: 89 fL (ref 78.0–100.0)
MONO ABS: 0.3 10*3/uL (ref 0.1–1.0)
MONOS PCT: 6 %
Neutro Abs: 2.4 10*3/uL (ref 1.7–7.7)
Neutrophils Relative %: 44 %
PLATELETS: 201 10*3/uL (ref 150–400)
RBC: 4.19 MIL/uL — ABNORMAL LOW (ref 4.22–5.81)
RDW: 14.5 % (ref 11.5–15.5)
WBC: 5.4 10*3/uL (ref 4.0–10.5)

## 2017-12-31 LAB — FERRITIN: Ferritin: 188 ng/mL (ref 24–336)

## 2017-12-31 LAB — LACTATE DEHYDROGENASE: LDH: 63 U/L — ABNORMAL LOW (ref 98–192)

## 2017-12-31 NOTE — Progress Notes (Signed)
Referring Physician:  Dr. Mannie Stabile  Diagnosis Loss of weight - Plan: CBC with Differential/Platelet, Comprehensive metabolic panel, Lactate dehydrogenase, Ferritin, Hepatitis B surface antibody, Hepatitis B surface antigen, Hepatitis B core antibody, total, Hepatitis panel, acute, Hepatitis C RNA quantitative, HIV antibody (with reflex)  Frequent stools - Plan: CBC with Differential/Platelet, Comprehensive metabolic panel, Lactate dehydrogenase, Ferritin, Hepatitis B surface antibody, Hepatitis B surface antigen, Hepatitis B core antibody, total, Hepatitis panel, acute, Hepatitis C RNA quantitative, HIV antibody (with reflex)  Staging Cancer Staging No matching staging information was found for the patient.  Assessment and Plan:  1.  Weight loss.  Chart and CT abdomen and pelvis done 12/17/2017 reviewed and showed IMPRESSION: 1. No acute abnormality. 2. Mild diffuse hepatic steatosis. 3. Prominent stool throughout the colon. 4. Minimal sigmoid diverticulosis. 5. Moderate prostatic hypertrophy.  Labs done today 12/29/2017 WNL.  Awaiting results of Hep and HIV testing.  Pt will RTC in 2 weeks to go over results.  He is referred to nutrition for assessment.    2  Diarrhea.  He reports this has been occurring after colonoscopy.  Review of chart shows procedure was done 08/22/2017 and showed 2 polyps and diverticulosis and lipoma.  Pathology returned as tubular adenomas with no malignancy seen.  He is referred back to GI for evaluation due to ongoing symptoms.  .    3.  HTN.  BP is 141/65.  Follow-up with PCP for management.    4.  Urinary problems.  Pt has evidence of BPH on recent CT scan done 12/2017 which may explain symptoms.  He should follow-up with PCP as directed for management.    5  Fatty liver.  Pt denies any alcohol use.  LFTs WNL.  Follow-up with GI recommended.    HPI:  67 yr old male referred for consultation due to weight loss.  Pt reports problems with diarrhea since colonoscopy.   He also reports he had recent imaging.  He denies smoking.  Denies any family history of cancer.  He also reports urinary problems.    Problem List Patient Active Problem List   Diagnosis Date Noted  . Frequent stools [K52.9] 12/11/2017  . Loss of weight [R63.4] 12/11/2017  . Tubular adenoma of colon [D12.6] 09/02/2017  . Meralgia paresthetica of right side [G57.11] 08/26/2017  . Hyperlipidemia LDL goal <100 [E78.5] 07/31/2017  . High risk medication use [Z79.899] 07/31/2017  . Encounter for hepatitis C screening test for low risk patient [Z11.59] 07/31/2017  . Hammer toe of left foot [M20.42] 07/31/2017  . Normocytic anemia [D64.9] 07/22/2017  . Nausea without vomiting [R11.0] 07/22/2017  . Intervertebral disc disorders with myelopathy of lumbar region [M51.06] 02/26/2017  . Spondylolysis of lumbar region [M43.06] 02/26/2017  . Essential hypertension [I10] 02/22/2017  . Benign prostatic hyperplasia without lower urinary tract symptoms [N40.0] 02/22/2017    Past Medical History Past Medical History:  Diagnosis Date  . Anemia   . Arthritis   . Heart murmur   . History of stomach ulcers   . Hyperlipidemia   . Hypertension   . Neuropathy     Past Surgical History Past Surgical History:  Procedure Laterality Date  . BACK SURGERY  1997   L4 L5  . CARPAL TUNNEL RELEASE Right   . COLONOSCOPY  02/2009   Dr. Laural Golden: few sigmoid diverticula, one at cecum, small submucosal lipoma at prox transverse colon  . COLONOSCOPY WITH PROPOFOL N/A 08/22/2017   Dr. Gala Romney: two tubular adenomas, sigmoid diverticulosis, colonic lipoma  .  ESOPHAGOGASTRODUODENOSCOPY (EGD) WITH PROPOFOL N/A 08/22/2017   Dr. Gala Romney: normal esophagus, normal stomach, normal duodenum  . POLYPECTOMY  08/22/2017   Procedure: POLYPECTOMY;  Surgeon: Daneil Dolin, MD;  Location: [facility];  Service: Endoscopy;;  sigmoid    Family History Family History  Problem Relation Age of Onset  . Parkinson's disease Mother   .  Hypertension Mother   . Hypertension Father   . Cancer Sister        Chemo related lung problems  . Diabetes Son   . Colon cancer Neg Hx   . Liver disease Neg Hx   . Pancreatic cancer Neg Hx      Social History  reports that he has never smoked. He has never used smokeless tobacco. He reports that he does not drink alcohol or use drugs.  Medications  Current Outpatient Medications:  .  aspirin EC 81 MG tablet, Take 1 tablet (81 mg total) by mouth daily., Disp: 90 tablet, Rfl: 3 .  gabapentin (NEURONTIN) 300 MG capsule, Take 300 mg by mouth 3 (three) times daily. , Disp: , Rfl:  .  gabapentin (NEURONTIN) 300 MG capsule, Take 2 pills in the am, 1 pill in the afternoon, and 2 pills in the evening, Disp: 150 capsule, Rfl: 0 .  hydrochlorothiazide (HYDRODIURIL) 25 MG tablet, TAKE 1 TABLET BY MOUTH ONCE DAILY, Disp: 90 tablet, Rfl: 0 .  HYDROcodone-acetaminophen (NORCO) 7.5-325 MG tablet, Take 1-2 tablets by mouth every 6 (six) hours as needed., Disp: , Rfl:  .  simvastatin (ZOCOR) 20 MG tablet, Take 1 tablet (20 mg total) by mouth at bedtime., Disp: 90 tablet, Rfl: 3 .  tamsulosin (FLOMAX) 0.4 MG CAPS capsule, Take 1 capsule (0.4 mg total) daily by mouth., Disp: 90 capsule, Rfl: 3 .  traMADol (ULTRAM) 50 MG tablet, Take 50-100 mg by mouth every 8 (eight) hours as needed (for pain.). , Disp: , Rfl:   Allergies Patient has no known allergies.  Review of Systems Review of Systems - Oncology ROS as per HPI otherwise 12 point ROS is negative.   Physical Exam  Vitals Wt Readings from Last 3 Encounters:  12/31/17 175 lb 4.8 oz (79.5 kg)  12/11/17 175 lb 12.8 oz (79.7 kg)  11/21/17 173 lb (78.5 kg)   Temp Readings from Last 3 Encounters:  12/31/17 98.1 F (36.7 C) (Oral)  12/11/17 (!) 97.2 F (36.2 C) (Oral)  11/21/17 97.8 F (36.6 C) (Temporal)   BP Readings from Last 3 Encounters:  12/31/17 (!) 141/65  12/11/17 127/67  11/21/17 (!) 138/58   Pulse Readings from Last 3  Encounters:  12/31/17 100  12/11/17 68  11/21/17 (!) 52   Constitutional: Well-developed, well-nourished, and in no distress.   HENT: Head: Normocephalic and atraumatic.  Mouth/Throat: No oropharyngeal exudate. Mucosa moist. Eyes: Pupils are equal, round, and reactive to light. Conjunctivae are normal. No scleral icterus.  Neck: Normal range of motion. Neck supple. No JVD present.  Cardiovascular: Normal rate, regular rhythm and normal heart sounds.  Exam reveals no gallop and no friction rub.   No murmur heard. Pulmonary/Chest: Effort normal and breath sounds normal. No respiratory distress. No wheezes.No rales.  Abdominal: Soft. Bowel sounds are normal. No distension. There is no tenderness. There is no guarding.  Musculoskeletal: No edema or tenderness.  Lymphadenopathy: No cervical, axillary or supraclavicular adenopathy.  Neurological: Alert and oriented to person, place, and time. No cranial nerve deficit.  Skin: Skin is warm and dry. No rash noted. No  erythema. No pallor.  Psychiatric: Affect and judgment normal.   Labs Appointment on 12/31/2017  Component Date Value Ref Range Status  . WBC 12/31/2017 5.4  4.0 - 10.5 K/uL Final  . RBC 12/31/2017 4.19* 4.22 - 5.81 MIL/uL Final  . Hemoglobin 12/31/2017 12.0* 13.0 - 17.0 g/dL Final  . HCT 12/31/2017 37.3* 39.0 - 52.0 % Final  . MCV 12/31/2017 89.0  78.0 - 100.0 fL Final  . MCH 12/31/2017 28.6  26.0 - 34.0 pg Final  . MCHC 12/31/2017 32.2  30.0 - 36.0 g/dL Final  . RDW 12/31/2017 14.5  11.5 - 15.5 % Final  . Platelets 12/31/2017 201  150 - 400 K/uL Final  . Neutrophils Relative % 12/31/2017 44  % Final  . Neutro Abs 12/31/2017 2.4  1.7 - 7.7 K/uL Final  . Lymphocytes Relative 12/31/2017 45  % Final  . Lymphs Abs 12/31/2017 2.5  0.7 - 4.0 K/uL Final  . Monocytes Relative 12/31/2017 6  % Final  . Monocytes Absolute 12/31/2017 0.3  0.1 - 1.0 K/uL Final  . Eosinophils Relative 12/31/2017 4  % Final  . Eosinophils Absolute  12/31/2017 0.2  0.0 - 0.7 K/uL Final  . Basophils Relative 12/31/2017 1  % Final  . Basophils Absolute 12/31/2017 0.0  0.0 - 0.1 K/uL Final   Performed at [facility], [address], [place], [place] [address]  . Sodium 12/31/2017 141  135 - 145 mmol/L Final  . Potassium 12/31/2017 3.6  3.5 - 5.1 mmol/L Final  . Chloride 12/31/2017 101  98 - 111 mmol/L Final   Please note change in reference range.  . CO2 12/31/2017 33* 22 - 32 mmol/L Final  . Glucose, Bld 12/31/2017 88  70 - 99 mg/dL Final   Please note change in reference range.  . BUN 12/31/2017 15  8 - 23 mg/dL Final   Please note change in reference range.  . Creatinine, Ser 12/31/2017 0.87  0.61 - 1.24 mg/dL Final  . Calcium 12/31/2017 9.1  8.9 - 10.3 mg/dL Final  . Total Protein 12/31/2017 7.5  6.5 - 8.1 g/dL Final  . Albumin 12/31/2017 4.2  3.5 - 5.0 g/dL Final  . AST 12/31/2017 21  15 - 41 U/L Final  . ALT 12/31/2017 13  0 - 44 U/L Final   Please note change in reference range.  . Alkaline Phosphatase 12/31/2017 90  38 - 126 U/L Final  . Total Bilirubin 12/31/2017 0.6  0.3 - 1.2 mg/dL Final  . GFR calc non Af Amer 12/31/2017 >60  >60 mL/min Final  . GFR calc Af Amer 12/31/2017 >60  >60 mL/min Final   Comment: (NOTE) The eGFR has been calculated using the CKD EPI equation. This calculation has not been validated in all clinical situations. eGFR's persistently <60 mL/min signify possible Chronic Kidney Disease.   Georgiann Hahn gap 12/31/2017 7  5 - 15 Final   Performed at [facility], [address], [place], [place] [address]  . LDH 12/31/2017 63* 98 - 192 U/L Final   Performed at [facility], [address], [place], [place] [address]     Pathology Orders Placed This Encounter  Procedures  . CBC with Differential/Platelet    Standing Status:   Future    Number of Occurrences:   1    Standing Expiration Date:   01/01/2019  . Comprehensive metabolic panel    Standing Status:   Future    Number of Occurrences:   1  Standing Expiration Date:   01/01/2019  . Lactate dehydrogenase    Standing Status:   Future    Number of Occurrences:   1    Standing Expiration Date:   01/01/2019  . Ferritin    Standing Status:   Future    Number of Occurrences:   1    Standing Expiration Date:   01/01/2019  . Hepatitis B surface antibody    Standing Status:   Future    Number of Occurrences:   1    Standing Expiration Date:   01/01/2019  . Hepatitis B surface antigen    Standing Status:   Future    Number of Occurrences:   1    Standing Expiration Date:   01/01/2019  . Hepatitis B core antibody, total    Standing Status:   Future    Number of Occurrences:   1    Standing Expiration Date:   01/01/2019  . Hepatitis panel, acute    Standing Status:   Future    Number of Occurrences:   1    Standing Expiration Date:   01/01/2019  . Hepatitis C RNA quantitative    Standing Status:   Future    Number of Occurrences:   1    Standing Expiration Date:   01/01/2019  . HIV antibody (with reflex)    Standing Status:   Future    Number of Occurrences:   1    Standing Expiration Date:   01/01/2019       Zoila Shutter MD

## 2017-12-31 NOTE — Patient Instructions (Signed)
[facility] Discharge Instructions  You saw Dr. Higgs today.   Thank you for choosing [facility] at [facility] to provide your oncology and hematology care.  To afford each patient quality time with our provider, please arrive at least 15 minutes before your scheduled appointment time.   If you have a lab appointment with the Cancer Center please come in thru the  Main Entrance and check in at the main information desk  You need to re-schedule your appointment should you arrive 10 or more minutes late.  We strive to give you quality time with our providers, and arriving late affects you and other patients whose appointments are after yours.  Also, if you no show three or more times for appointments you may be dismissed from the clinic at the providers discretion.     Again, thank you for choosing [facility].  Our hope is that these requests will decrease the amount of time that you wait before being seen by our physicians.       _____________________________________________________________  Should you have questions after your visit to [facility], please contact our office at [phone] between the hours of 8:30 a.m. and 4:30 p.m.  Voicemails left after 4:30 p.m. will not be returned until the following business day.  For prescription refill requests, have your pharmacy contact our office.       Resources For Cancer Patients and their Caregivers ? American Cancer Society: Can assist with transportation, wigs, general needs, runs Look Good Feel Better.        1-888-227-6333 ? Cancer Care: Provides financial assistance, online support groups, medication/co-pay assistance.  1-800-813-HOPE (4673) ? [facility] Assists [place] cancer patients and their families through emotional , educational and financial support.  336-427-4357 ? [place] DSS Where to apply for food  stamps, Medicaid and utility assistance. 336-342-1394 ? [facility]: Transportation to medical appointments. 336-347-2287 ? Social Security Administration: May apply for disability if have a Stage IV cancer. 336-342-7796 1-800-772-1213 ? [place] [facility]ging, Disability and Transit Services: Assists with nutrition, care and transit needs. 336-349-2343  Cancer Center Support Programs:   > Cancer Support Group  2nd Tuesday of the month 1pm-2pm, Journey Room   > Creative Journey  3rd Tuesday of the month 1130am-1pm, Journey Room     

## 2018-01-01 LAB — HEPATITIS PANEL, ACUTE
HCV Ab: <0.1 s/co ratio (ref 0.0–0.9)
Hep A IgM: NEGATIVE
Hep B C IgM: NEGATIVE
Hepatitis B Surface Ag: NEGATIVE

## 2018-01-01 LAB — HEPATITIS B SURFACE ANTIGEN: Hepatitis B Surface Ag: NEGATIVE

## 2018-01-01 LAB — HEPATITIS B CORE ANTIBODY, TOTAL: Hep B Core Total Ab: NEGATIVE

## 2018-01-01 LAB — HEPATITIS B SURFACE ANTIBODY,QUALITATIVE: HEP B S AB: NONREACTIVE

## 2018-01-01 LAB — HIV ANTIBODY (ROUTINE TESTING W REFLEX): HIV SCREEN 4TH GENERATION: NONREACTIVE

## 2018-01-03 DIAGNOSIS — D649 Anemia, unspecified: Secondary | ICD-10-CM | POA: Diagnosis not present

## 2018-01-03 DIAGNOSIS — Z79899 Other long term (current) drug therapy: Secondary | ICD-10-CM | POA: Diagnosis not present

## 2018-01-03 DIAGNOSIS — E785 Hyperlipidemia, unspecified: Secondary | ICD-10-CM | POA: Diagnosis not present

## 2018-01-03 DIAGNOSIS — Z1159 Encounter for screening for other viral diseases: Secondary | ICD-10-CM | POA: Diagnosis not present

## 2018-01-04 LAB — HEPATITIS C ANTIBODY
HEP C AB: NONREACTIVE
SIGNAL TO CUT-OFF: 0.01 (ref <1.00)

## 2018-01-04 LAB — BASIC METABOLIC PANEL
BUN: 17 mg/dL (ref 7–25)
CALCIUM: 9.6 mg/dL (ref 8.6–10.3)
CHLORIDE: 101 mmol/L (ref 98–110)
CO2: 33 mmol/L — ABNORMAL HIGH (ref 20–32)
CREATININE: 0.85 mg/dL (ref 0.70–1.25)
Glucose, Bld: 92 mg/dL (ref 65–99)
POTASSIUM: 4.3 mmol/L (ref 3.5–5.3)
Sodium: 140 mmol/L (ref 135–146)

## 2018-01-04 LAB — CBC WITH DIFFERENTIAL/PLATELET
BASOS ABS: 50 {cells}/uL (ref 0–200)
Basophils Relative: 0.8 %
EOS PCT: 3.5 %
Eosinophils Absolute: 217 cells/uL (ref 15–500)
HEMATOCRIT: 35.7 % — AB (ref 38.5–50.0)
HEMOGLOBIN: 11.9 g/dL — AB (ref 13.2–17.1)
Lymphs Abs: 2604 cells/uL (ref 850–3900)
MCH: 28.5 pg (ref 27.0–33.0)
MCHC: 33.3 g/dL (ref 32.0–36.0)
MCV: 85.6 fL (ref 80.0–100.0)
MONOS PCT: 6.7 %
MPV: 10.9 fL (ref 7.5–12.5)
NEUTROS ABS: 2914 {cells}/uL (ref 1500–7800)
Neutrophils Relative %: 47 %
Platelets: 217 10*3/uL (ref 140–400)
RBC: 4.17 10*6/uL — ABNORMAL LOW (ref 4.20–5.80)
RDW: 12.6 % (ref 11.0–15.0)
Total Lymphocyte: 42 %
WBC mixed population: 415 cells/uL (ref 200–950)
WBC: 6.2 10*3/uL (ref 3.8–10.8)

## 2018-01-04 LAB — HEPATIC FUNCTION PANEL
AG RATIO: 1.5 (calc) (ref 1.0–2.5)
ALKALINE PHOSPHATASE (APISO): 92 U/L (ref 40–115)
ALT: 8 U/L — AB (ref 9–46)
AST: 15 U/L (ref 10–35)
Albumin: 4.2 g/dL (ref 3.6–5.1)
BILIRUBIN INDIRECT: 0.5 mg/dL (ref 0.2–1.2)
Bilirubin, Direct: 0.2 mg/dL (ref 0.0–0.2)
Globulin: 2.8 g/dL (calc) (ref 1.9–3.7)
TOTAL PROTEIN: 7 g/dL (ref 6.1–8.1)
Total Bilirubin: 0.7 mg/dL (ref 0.2–1.2)

## 2018-01-04 LAB — LIPID PANEL
CHOL/HDL RATIO: 3.1 (calc) (ref <5.0)
Cholesterol: 185 mg/dL (ref <200)
HDL: 59 mg/dL (ref >40)
LDL CHOLESTEROL (CALC): 112 mg/dL — AB
NON-HDL CHOLESTEROL (CALC): 126 mg/dL (ref <130)
Triglycerides: 47 mg/dL (ref <150)

## 2018-01-04 LAB — IRON,TIBC AND FERRITIN PANEL
%SAT: 26 % (ref 20–48)
Ferritin: 275 ng/mL (ref 24–380)
IRON: 83 ug/dL (ref 50–180)
TIBC: 319 mcg/dL (calc) (ref 250–425)

## 2018-01-04 LAB — RETICULOCYTES
ABS RETIC: 29260 {cells}/uL (ref 25000–9000)
RETIC CT PCT: 0.7 %

## 2018-01-07 ENCOUNTER — Encounter: Payer: Self-pay | Admitting: Family Medicine

## 2018-01-08 ENCOUNTER — Encounter: Payer: Self-pay | Admitting: Family Medicine

## 2018-01-08 ENCOUNTER — Ambulatory Visit (INDEPENDENT_AMBULATORY_CARE_PROVIDER_SITE_OTHER): Payer: PPO | Admitting: Family Medicine

## 2018-01-08 ENCOUNTER — Other Ambulatory Visit: Payer: Self-pay

## 2018-01-08 VITALS — BP 130/72 | HR 68 | Temp 97.9°F | Resp 16 | Ht 72.0 in | Wt 175.1 lb

## 2018-01-08 DIAGNOSIS — G8929 Other chronic pain: Secondary | ICD-10-CM

## 2018-01-08 DIAGNOSIS — I10 Essential (primary) hypertension: Secondary | ICD-10-CM

## 2018-01-08 DIAGNOSIS — E785 Hyperlipidemia, unspecified: Secondary | ICD-10-CM

## 2018-01-08 DIAGNOSIS — M25511 Pain in right shoulder: Secondary | ICD-10-CM

## 2018-01-08 DIAGNOSIS — N401 Enlarged prostate with lower urinary tract symptoms: Secondary | ICD-10-CM

## 2018-01-08 NOTE — Patient Instructions (Signed)
Ask Dr. Walden Field to go over your iron studies/blood counts with you.

## 2018-01-08 NOTE — Progress Notes (Signed)
Patient ID: Charles Ayala, male    DOB: [birth year], 67 y.o.   MRN: [id number]  Chief Complaint  Patient presents with  . lab work    follow up    Allergies Patient has no known allergies.  Subjective:   Charles Ayala is a 67 y.o. male who presents to [facility] today.  HPI Here for follow up visit. Has gained two pounds.  He has been seen by hematology/oncology and gastroenterology since his last visit.  He has had a CT scan of his abdomen which revealed hepatic steatosis, BPH, and stool throughout his colon.  He reports that "something has him jacked up since the colonoscopy and does not know what it is".  He now complains that he feels like has to urinate more now since the colonoscopy. Feels like has to urinate urgently and then when he gets there to urinate cannot get the urine out. Reports that has had BPH and has been on flomax for several years. Has been seen by urology and had some prostate studies and voiding studies done in the past, but does not know who he has seen.  Is not sure that he wants to increase his Flomax.  He reports he feels like he is on numerous medications.  He has been taking his cholesterol medication each day as directed.  Denies any myalgias with the medication.  Is here for follow-up of his cholesterol.  He had his labs done prior to the appointment. Reports that he is also having some continued shoulder pain and would like referral to physical therapy.  Is been seen by Dr. Aline Brochure for his rotator cuff in the past.  Reports that several months ago he was playing with his grandchild and pulled his shoulder in a funny way and it is been bothering him.  He has had improvement with physical therapy in the past and would like to go to physical therapy before going back to orthopedics.  He denies any numbness or tingling in his arm.  He reports he does get some pain with elevation of his arm over his head and some limited range of motion at his shoulder.   His grip is good.  Has not had any swelling in his shoulder or arm.  He reports that he is having bowel movements every day.  He eats a healthy diet.  Does not really eat much meat but eats lots of beans and vegetables.  Reports that his stool is still not formed and this bothers him.  Denies any melena or bright red blood per rectum.  Reports there is no change in his issues with his feet.   Past Medical History:  Diagnosis Date  . Anemia   . Arthritis   . Heart murmur   . History of stomach ulcers   . Hyperlipidemia   . Hypertension   . Neuropathy     Past Surgical History:  Procedure Laterality Date  . BACK SURGERY  1997   L4 L5  . CARPAL TUNNEL RELEASE Right   . COLONOSCOPY  02/2009   Dr. Laural Golden: few sigmoid diverticula, one at cecum, small submucosal lipoma at prox transverse colon  . COLONOSCOPY WITH PROPOFOL N/A 08/22/2017   Dr. Gala Romney: two tubular adenomas, sigmoid diverticulosis, colonic lipoma  . ESOPHAGOGASTRODUODENOSCOPY (EGD) WITH PROPOFOL N/A 08/22/2017   Dr. Gala Romney: normal esophagus, normal stomach, normal duodenum  . POLYPECTOMY  08/22/2017   Procedure: POLYPECTOMY;  Surgeon: Daneil Dolin, MD;  [facility]: [facility];  Service: Endoscopy;;  sigmoid    Family History  Problem Relation Age of Onset  . Parkinson's disease Mother   . Hypertension Mother   . Hypertension Father   . Cancer Sister        Chemo related lung problems  . Diabetes Son   . Colon cancer Neg Hx   . Liver disease Neg Hx   . Pancreatic cancer Neg Hx      Social History   Socioeconomic History  . Marital status: Married    Spouse name: Not on file  . Number of children: Not on file  . Years of education: Not on file  . Highest education level: Not on file  Occupational History  . Occupation: retired  Scientific laboratory technician  . Financial resource strain: Not on file  . Food insecurity:    Worry: Not on file    Inability: Not on file  . Transportation needs:    Medical: Not on file     Non-medical: Not on file  Tobacco Use  . Smoking status: Never Smoker  . Smokeless tobacco: Never Used  Substance and Sexual Activity  . Alcohol use: No  . Drug use: No  . Sexual activity: Yes  Lifestyle  . Physical activity:    Days per week: Not on file    Minutes per session: Not on file  . Stress: Not on file  Relationships  . Social connections:    Talks on phone: Not on file    Gets together: Not on file    Attends religious service: Not on file    Active member of club or organization: Not on file    Attends meetings of clubs or organizations: Not on file    Relationship status: Not on file  Other Topics Concern  . Not on file  Social History Narrative   Retired. Used to work at [employer]. Married. Lives in [place]. Used to live in [place]. Has four children. Enjoys gardening and playing with grandchildren.     Review of Systems  Constitutional: Negative for appetite change, chills, fever and unexpected weight change.  HENT: Negative for trouble swallowing and voice change.   Eyes: Negative for visual disturbance.  Respiratory: Negative for cough, chest tightness, shortness of breath and wheezing.   Cardiovascular: Negative for chest pain, palpitations and leg swelling.  Gastrointestinal: Negative for abdominal pain, diarrhea, nausea and vomiting.  Genitourinary: Negative for decreased urine volume, dysuria and frequency.  Musculoskeletal: Negative for myalgias.       Left shoulder pain  Skin: Negative for rash.  Neurological: Negative for dizziness, tremors, syncope, facial asymmetry, weakness, numbness and headaches.  Hematological: Negative for adenopathy. Does not bruise/bleed easily.  Psychiatric/Behavioral: Negative for dysphoric mood and sleep disturbance.    Current Outpatient Medications on File Prior to Visit  Medication Sig Dispense Refill  . aspirin EC 81 MG tablet Take 1 tablet (81 mg total) by mouth daily. 90 tablet 3  . gabapentin  (NEURONTIN) 300 MG capsule Take 2 pills in the am, 1 pill in the afternoon, and 2 pills in the evening 150 capsule 0  . hydrochlorothiazide (HYDRODIURIL) 25 MG tablet TAKE 1 TABLET BY MOUTH ONCE DAILY 90 tablet 0  . HYDROcodone-acetaminophen (NORCO) 7.5-325 MG tablet Take 1-2 tablets by mouth every 6 (six) hours as needed.    . simvastatin (ZOCOR) 20 MG tablet Take 1 tablet (20 mg total) by mouth at bedtime. 90 tablet 3  . tamsulosin (FLOMAX) 0.4 MG CAPS capsule Take  1 capsule (0.4 mg total) daily by mouth. 90 capsule 3  . traMADol (ULTRAM) 50 MG tablet Take 50-100 mg by mouth every 8 (eight) hours as needed (for pain.).      No current facility-administered medications on file prior to visit.     Objective:   BP 130/72 (BP Location: Left Arm, Patient Position: Sitting, Cuff Size: Large)   Pulse 68   Temp 97.9 F (36.6 C) (Temporal)   Resp 16   Ht 6' (1.829 m)   Wt 175 lb 1.9 oz (79.4 kg)   SpO2 97%   BMI 23.75 kg/m   Physical Exam  Constitutional: He is oriented to person, place, and time. He appears well-developed and well-nourished.  HENT:  Head: Normocephalic and atraumatic.  Eyes: Pupils are equal, round, and reactive to light. Conjunctivae and EOM are normal. No scleral icterus.  Neck: Normal range of motion. Neck supple.  Cardiovascular: Normal rate, regular rhythm and normal heart sounds.  Pulmonary/Chest: Effort normal and breath sounds normal.  Musculoskeletal:       Left shoulder: He exhibits decreased range of motion. He exhibits no tenderness, no bony tenderness, no swelling, no effusion and no crepitus.  Neurological: He is alert and oriented to person, place, and time.  Skin: Skin is warm and dry.  Vitals reviewed.     Assessment and Plan  1. Benign localized prostatic hyperplasia with lower urinary tract symptoms (LUTS) Patient will continue his Flomax.  Refer to urology for evaluation. - Ambulatory referral to Urology  2. Chronic right shoulder pain We  will go ahead and place referral for physical therapy at this time.  This is been a chronic issue the patient has been followed for by Dr. Aline Brochure.  Will place referral for physical therapy and he will also follow-up with Dr. Aline Brochure. - Ambulatory referral to Physical Therapy - Ambulatory referral to Sports Medicine   3. Hyperlipidemia LDL goal <100 Improved.  Continue with dietary modifications.  Plan to recheck in 3 to 6 months.  4. Essential hypertension Stable.  Continue HCTZ as directed.  Long discussion with patient and his wife today regarding his overall state of health.  We discussed that his weight is stable.  He has gained 2 pounds since his last visit.  He has been seen by gastroenterology and hematology/oncology.  He has a scheduled follow-up with oncology in 2 weeks.  He will keep that visit.  He is also had a CT of his abdomen and a colonoscopy.  The work-up thus far has been negative.  His bowels are looser but he is not having diarrhea.  He is also not having any melena, bright red blood per rectum, or hematochezia.  I did discuss with patient that he does seem to worry and focus a lot on his overall health.  I reassured him that thus far everything has been reassuring.  I discussed with him that I believe that he is in a good state of health.  We did discuss that even though his bowels are not as hard as they were in the past that they are not consistent with diarrhea.  We did discuss that I believe that his stool is looser because he has a very healthy diet.  We did discuss fiber options to help bulk up his stool but also discussed that this could increase his frequency of stooling.  He voiced understanding.  Patient and his wife were given reassurance.  We discussed his blood work.  We discussed his  cholesterol today.  We discussed his LDL goal would be less than 100 but at this time we will not increase his cholesterol medication due to his side effects in the past with medication  and the fact that he has made some dietary improvements.  He had multiple questions today regarding medications, shoulder, bowels, prostate, labs, and imaging studies.  These questions were answered.  Office visit was greater than 25 minutes.  Greater than 50% of office visit was spent counseling patient.  He will call with any questions or concerns.  He will keep his scheduled follow-up with hematology oncology, gastroenterology, urology, physical therapy, and orthopedics.   I did discuss with patient that I would like for him to review his CBC and iron studies with Dr. Walden Field.  He voiced understanding. Return in about 2 months (around 03/11/2018). Caren Macadam, MD 01/08/2018

## 2018-01-11 ENCOUNTER — Other Ambulatory Visit: Payer: Self-pay | Admitting: Family Medicine

## 2018-01-11 DIAGNOSIS — I10 Essential (primary) hypertension: Secondary | ICD-10-CM

## 2018-01-14 ENCOUNTER — Inpatient Hospital Stay (HOSPITAL_BASED_OUTPATIENT_CLINIC_OR_DEPARTMENT_OTHER): Payer: PPO | Admitting: Internal Medicine

## 2018-01-14 ENCOUNTER — Encounter (HOSPITAL_COMMUNITY): Payer: Self-pay | Admitting: Internal Medicine

## 2018-01-14 ENCOUNTER — Other Ambulatory Visit: Payer: Self-pay

## 2018-01-14 VITALS — BP 151/71 | HR 61 | Temp 98.3°F | Resp 18 | Wt 178.1 lb

## 2018-01-14 DIAGNOSIS — N400 Enlarged prostate without lower urinary tract symptoms: Secondary | ICD-10-CM

## 2018-01-14 DIAGNOSIS — Z7982 Long term (current) use of aspirin: Secondary | ICD-10-CM

## 2018-01-14 DIAGNOSIS — D508 Other iron deficiency anemias: Secondary | ICD-10-CM

## 2018-01-14 DIAGNOSIS — E785 Hyperlipidemia, unspecified: Secondary | ICD-10-CM

## 2018-01-14 DIAGNOSIS — M1990 Unspecified osteoarthritis, unspecified site: Secondary | ICD-10-CM

## 2018-01-14 DIAGNOSIS — R634 Abnormal weight loss: Secondary | ICD-10-CM | POA: Diagnosis not present

## 2018-01-14 DIAGNOSIS — Z8719 Personal history of other diseases of the digestive system: Secondary | ICD-10-CM | POA: Diagnosis not present

## 2018-01-14 DIAGNOSIS — R197 Diarrhea, unspecified: Secondary | ICD-10-CM

## 2018-01-14 DIAGNOSIS — D509 Iron deficiency anemia, unspecified: Secondary | ICD-10-CM

## 2018-01-14 DIAGNOSIS — Z79899 Other long term (current) drug therapy: Secondary | ICD-10-CM | POA: Diagnosis not present

## 2018-01-14 DIAGNOSIS — K760 Fatty (change of) liver, not elsewhere classified: Secondary | ICD-10-CM | POA: Diagnosis not present

## 2018-01-14 DIAGNOSIS — I10 Essential (primary) hypertension: Secondary | ICD-10-CM

## 2018-01-14 NOTE — Progress Notes (Signed)
Diagnosis Loss of weight - Plan: CBC with Differential/Platelet, Comprehensive metabolic panel, Lactate dehydrogenase, Ferritin  Other iron deficiency anemia - Plan: CBC with Differential/Platelet, Comprehensive metabolic panel, Lactate dehydrogenase, Ferritin  Staging Cancer Staging No matching staging information was found for the patient.  Assessment and Plan:  1.  Weight loss.  Pt had CT abdomen and pelvis done 12/17/2017 that showed IMPRESSION: 1. No acute abnormality. 2. Mild diffuse hepatic steatosis. 3. Prominent stool throughout the colon. 4. Minimal sigmoid diverticulosis. 5. Moderate prostatic hypertrophy.  Labs done 12/29/2017 reviewed with pt and showed WBC 6.2 HB 11.9 plts 217,000.  He has a normal differential.  Chemistries WNL.  Ferritin normal at 275.  Hepatitis and HIV labs negative.  I have discussed with him to follow-up  with GI as CT and labs have been unrevealing.  He was previously referred for nutrition assessment.  Weight today is stable at 175 lbs.  Pt will follow-up with 6 months with labs.    2  Diarrhea.  He reports this has been occurring after colonoscopy.  Procedure was done 08/22/2017 and showed 2 polyps and diverticulosis and lipoma.  Pathology returned as tubular adenomas with no malignancy seen.  GI was contacted today to facilitate follow-up due to pt's ongoing GI complaints.   3.  BPH.  This was noted on CT scan done 12/2017 .  He reports she has been seen by urology in the past.  Follow-up with urology and PCP  for management.    4  Fatty liver.  Pt denies any alcohol use.  LFTs WNL.  Hepatitis panel negative.  I have discussed with him this is a benign finding.  Follow-up with GI.    5.  HTN.  BP is 151/71.  Follow-up with PCP.    Interval History:  67 yr old male seen for consultation due to weight loss.  He reports GI symptoms since colonoscopy.    Current Status:  Pt is seen today for follow-up.  He is here to go over labs.  He continues to  complain of diarrhea and GI symptoms since colonoscopy.      Problem List Patient Active Problem List   Diagnosis Date Noted  . Frequent stools [K52.9] 12/11/2017  . Loss of weight [R63.4] 12/11/2017  . Tubular adenoma of colon [D12.6] 09/02/2017  . Meralgia paresthetica of right side [G57.11] 08/26/2017  . Hyperlipidemia LDL goal <100 [E78.5] 07/31/2017  . High risk medication use [Z79.899] 07/31/2017  . Encounter for hepatitis C screening test for low risk patient [Z11.59] 07/31/2017  . Hammer toe of left foot [M20.42] 07/31/2017  . Normocytic anemia [D64.9] 07/22/2017  . Nausea without vomiting [R11.0] 07/22/2017  . Intervertebral disc disorders with myelopathy of lumbar region [M51.06] 02/26/2017  . Spondylolysis of lumbar region [M43.06] 02/26/2017  . Essential hypertension [I10] 02/22/2017  . Benign prostatic hyperplasia without lower urinary tract symptoms [N40.0] 02/22/2017    Past Medical History Past Medical History:  Diagnosis Date  . Anemia   . Arthritis   . Heart murmur   . History of stomach ulcers   . Hyperlipidemia   . Hypertension   . Neuropathy     Past Surgical History Past Surgical History:  Procedure Laterality Date  . BACK SURGERY  1997   L4 L5  . CARPAL TUNNEL RELEASE Right   . COLONOSCOPY  02/2009   Dr. Laural Golden: few sigmoid diverticula, one at cecum, small submucosal lipoma at prox transverse colon  . COLONOSCOPY WITH PROPOFOL N/A 08/22/2017  Dr. Gala Romney: two tubular adenomas, sigmoid diverticulosis, colonic lipoma  . ESOPHAGOGASTRODUODENOSCOPY (EGD) WITH PROPOFOL N/A 08/22/2017   Dr. Gala Romney: normal esophagus, normal stomach, normal duodenum  . POLYPECTOMY  08/22/2017   Procedure: POLYPECTOMY;  Surgeon: Daneil Dolin, MD;  [facility]: [facility];  Service: Endoscopy;;  sigmoid    Family History Family History  Problem Relation Age of Onset  . Parkinson's disease Mother   . Hypertension Mother   . Hypertension Father   . Cancer Sister         Chemo related lung problems  . Diabetes Son   . Colon cancer Neg Hx   . Liver disease Neg Hx   . Pancreatic cancer Neg Hx      Social History  reports that he has never smoked. He has never used smokeless tobacco. He reports that he does not drink alcohol or use drugs.  Medications  Current Outpatient Medications:  .  aspirin EC 81 MG tablet, Take 1 tablet (81 mg total) by mouth daily., Disp: 90 tablet, Rfl: 3 .  gabapentin (NEURONTIN) 300 MG capsule, Take 2 pills in the am, 1 pill in the afternoon, and 2 pills in the evening, Disp: 150 capsule, Rfl: 0 .  hydrochlorothiazide (HYDRODIURIL) 25 MG tablet, TAKE 1 TABLET BY MOUTH ONCE DAILY, Disp: 90 tablet, Rfl: 0 .  hydrochlorothiazide (HYDRODIURIL) 25 MG tablet, TAKE 1 TABLET BY MOUTH ONCE DAILY, Disp: 90 tablet, Rfl: 0 .  HYDROcodone-acetaminophen (NORCO) 7.5-325 MG tablet, Take 1-2 tablets by mouth every 6 (six) hours as needed., Disp: , Rfl:  .  simvastatin (ZOCOR) 20 MG tablet, Take 1 tablet (20 mg total) by mouth at bedtime., Disp: 90 tablet, Rfl: 3 .  tamsulosin (FLOMAX) 0.4 MG CAPS capsule, Take 1 capsule (0.4 mg total) daily by mouth., Disp: 90 capsule, Rfl: 3 .  traMADol (ULTRAM) 50 MG tablet, Take 50-100 mg by mouth every 8 (eight) hours as needed (for pain.). , Disp: , Rfl:   Allergies Patient has no known allergies.  Review of Systems Review of Systems - Oncology ROS as per HPI otherwise 12 point ROS is negative.   Physical Exam  Vitals Wt Readings from Last 3 Encounters:  01/14/18 178 lb 1.6 oz (80.8 kg)  01/08/18 175 lb 1.9 oz (79.4 kg)  12/31/17 175 lb 4.8 oz (79.5 kg)   Temp Readings from Last 3 Encounters:  01/14/18 98.3 F (36.8 C) (Oral)  01/08/18 97.9 F (36.6 C) (Temporal)  12/31/17 98.1 F (36.7 C) (Oral)   BP Readings from Last 3 Encounters:  01/14/18 (!) 151/71  01/08/18 130/72  12/31/17 (!) 141/65   Pulse Readings from Last 3 Encounters:  01/14/18 61  01/08/18 68  12/31/17 100    Constitutional: Well-developed, well-nourished, and in no distress.   HENT: Head: Normocephalic and atraumatic.  Mouth/Throat: No oropharyngeal exudate. Mucosa moist. Eyes: Pupils are equal, round, and reactive to light. Conjunctivae are normal. No scleral icterus.  Neck: Normal range of motion. Neck supple. No JVD present.  Cardiovascular: Normal rate, regular rhythm and normal heart sounds.  Exam reveals no gallop and no friction rub.   No murmur heard. Pulmonary/Chest: Effort normal and breath sounds normal. No respiratory distress. No wheezes.No rales.  Abdominal: Soft. Bowel sounds are normal. No distension. There is no tenderness. There is no guarding.  Musculoskeletal: No edema or tenderness.  Lymphadenopathy: No cervical, axillary or supraclavicular adenopathy.  Neurological: Alert and oriented to person, place, and time. No cranial nerve deficit.  Skin:  Skin is warm and dry. No rash noted. No erythema. No pallor.  Psychiatric: Affect and judgment normal.   Labs No visits with results within 3 Day(s) from this visit.  Latest known visit with results is:  Appointment on 12/31/2017  Component Date Value Ref Range Status  . WBC 12/31/2017 5.4  4.0 - 10.5 K/uL Final  . RBC 12/31/2017 4.19* 4.22 - 5.81 MIL/uL Final  . Hemoglobin 12/31/2017 12.0* 13.0 - 17.0 g/dL Final  . HCT 12/31/2017 37.3* 39.0 - 52.0 % Final  . MCV 12/31/2017 89.0  78.0 - 100.0 fL Final  . MCH 12/31/2017 28.6  26.0 - 34.0 pg Final  . MCHC 12/31/2017 32.2  30.0 - 36.0 g/dL Final  . RDW 12/31/2017 14.5  11.5 - 15.5 % Final  . Platelets 12/31/2017 201  150 - 400 K/uL Final  . Neutrophils Relative % 12/31/2017 44  % Final  . Neutro Abs 12/31/2017 2.4  1.7 - 7.7 K/uL Final  . Lymphocytes Relative 12/31/2017 45  % Final  . Lymphs Abs 12/31/2017 2.5  0.7 - 4.0 K/uL Final  . Monocytes Relative 12/31/2017 6  % Final  . Monocytes Absolute 12/31/2017 0.3  0.1 - 1.0 K/uL Final  . Eosinophils Relative 12/31/2017 4  %  Final  . Eosinophils Absolute 12/31/2017 0.2  0.0 - 0.7 K/uL Final  . Basophils Relative 12/31/2017 1  % Final  . Basophils Absolute 12/31/2017 0.0  0.0 - 0.1 K/uL Final   Performed at [facility], [address] [place], [place] [address]  . Sodium 12/31/2017 141  135 - 145 mmol/L Final  . Potassium 12/31/2017 3.6  3.5 - 5.1 mmol/L Final  . Chloride 12/31/2017 101  98 - 111 mmol/L Final   Please note change in reference range.  . CO2 12/31/2017 33* 22 - 32 mmol/L Final  . Glucose, Bld 12/31/2017 88  70 - 99 mg/dL Final   Please note change in reference range.  . BUN 12/31/2017 15  8 - 23 mg/dL Final   Please note change in reference range.  . Creatinine, Ser 12/31/2017 0.87  0.61 - 1.24 mg/dL Final  . Calcium 12/31/2017 9.1  8.9 - 10.3 mg/dL Final  . Total Protein 12/31/2017 7.5  6.5 - 8.1 g/dL Final  . Albumin 12/31/2017 4.2  3.5 - 5.0 g/dL Final  . AST 12/31/2017 21  15 - 41 U/L Final  . ALT 12/31/2017 13  0 - 44 U/L Final   Please note change in reference range.  . Alkaline Phosphatase 12/31/2017 90  38 - 126 U/L Final  . Total Bilirubin 12/31/2017 0.6  0.3 - 1.2 mg/dL Final  . GFR calc non Af Amer 12/31/2017 >60  >60 mL/min Final  . GFR calc Af Amer 12/31/2017 >60  >60 mL/min Final   Comment: (NOTE) The eGFR has been calculated using the CKD EPI equation. This calculation has not been validated in all clinical situations. eGFR's persistently <60 mL/min signify possible Chronic Kidney Disease.   Georgiann Hahn gap 12/31/2017 7  5 - 15 Final   Performed at [facility], [address] [place], [place] [address]  . LDH 12/31/2017 63* 98 - 192 U/L Final   Performed at [facility], [address] [place], [place] [address]  . Ferritin 12/31/2017 188  24 - 336 ng/mL Final   Performed at [facility], [place] [address] [place], [place] [address]  . Hep B S Ab 12/31/2017 Non Reactive   Final  Comment: (NOTE)              Non Reactive: Inconsistent with immunity,                             less than 10 mIU/mL              Reactive:     Consistent with immunity,                            greater than 9.9 mIU/mL Performed At: [facility] [place], [place] [id number] Rush Farmer MD [id number]   . Hepatitis B Surface Ag 12/31/2017 Negative  Negative Final   Comment: (NOTE) Performed At: [facility] [place], [place] [id number] Rush Farmer MD [id number]   . Hep B Core Total Ab 12/31/2017 Negative  Negative Final   Comment: (NOTE) Performed At: [facility] [place], [place] [id number] Rush Farmer MD [id number]   . Hepatitis B Surface Ag 12/31/2017 Negative  Negative Final  . HCV Ab 12/31/2017 <0.1  0.0 - 0.9 s/co ratio Final   Comment: (NOTE)                                  Negative:     < 0.8                             Indeterminate: 0.8 - 0.9                                  Positive:     > 0.9 The CDC recommends that a positive HCV antibody result be followed up with a HCV Nucleic Acid Amplification test ([id number]). Performed At: [facility] [place], [place] [id number] Rush Farmer MD [id number]   . Hep A IgM 12/31/2017 Negative  Negative Final  . Hep B C IgM 12/31/2017 Negative  Negative Final  . HIV Screen 4th Generation wRfx 12/31/2017 Non Reactive  Non Reactive Final   Comment: (NOTE) Performed At: [facility] [place], [place] [id number] Rush Farmer MD [id number]      Pathology Orders Placed This Encounter  Procedures  . CBC with Differential/Platelet    Standing Status:   Future    Standing Expiration Date:   01/15/2020  . Comprehensive metabolic panel    Standing Status:   Future    Standing Expiration Date:   01/15/2020  . Lactate dehydrogenase    Standing Status:   Future    Standing Expiration Date:   01/15/2020  . Ferritin    Standing Status:   Future    Standing Expiration Date:    01/15/2020       Zoila Shutter MD

## 2018-01-22 ENCOUNTER — Ambulatory Visit (HOSPITAL_COMMUNITY): Payer: PPO | Attending: Family Medicine | Admitting: Physical Therapy

## 2018-01-22 ENCOUNTER — Telehealth: Payer: Self-pay | Admitting: Family Medicine

## 2018-01-22 ENCOUNTER — Other Ambulatory Visit: Payer: Self-pay

## 2018-01-22 DIAGNOSIS — M25612 Stiffness of left shoulder, not elsewhere classified: Secondary | ICD-10-CM

## 2018-01-22 DIAGNOSIS — M25512 Pain in left shoulder: Secondary | ICD-10-CM

## 2018-01-22 DIAGNOSIS — G8929 Other chronic pain: Secondary | ICD-10-CM | POA: Diagnosis not present

## 2018-01-22 DIAGNOSIS — R29898 Other symptoms and signs involving the musculoskeletal system: Secondary | ICD-10-CM | POA: Diagnosis not present

## 2018-01-22 NOTE — Patient Instructions (Addendum)
ROM: Flexion - Wand (Supine)    Lie on back holding wand. Raise arms over head until a stretch is felt.  Repeat __10__ times per set. Do _1___ sets per session. Do _2___ sessions per day.  http://orth.exer.us/928   Copyright  VHI. All rights reserved.  ROM: Abduction - Wand    Holding wand with left hand palm up, push wand directly out to side, leading with other hand palm down, until stretch is felt. Hold _5___ seconds. Repeat ___10_ times per set. Do __1__ sets per session. Do __2__ sessions per day.  http://orth.exer.us/746   Copyright  VHI. All rights reserved.  ROM: External / Internal Rotation - Wand    Holding a can with left hand  Keep elbow bent. Rotate your arm backward When stretch is felt, hold _3___ seconds. Repeat going down to your hip.  Repeat 10____ times per set. Do __1__ sets per session. Do _2___ sessions per day.  http://orth.exer.us/748   Copyright  VHI. All rights reserved.

## 2018-01-22 NOTE — Therapy (Signed)
Charles Ayala [address] [place], [place], [address] Phone: [phone]   Fax:  [phone]  Physical Therapy Evaluation  Patient Details  Name: Charles Ayala MRN: [id number] Date of Birth: 08/07/1950 Referring Provider: Caren Macadam   Encounter Date: 01/22/2018  PT End of Session - 01/22/18 1550    Visit Number  1    Number of Visits  18    Date for PT Re-Evaluation  02/21/18    PT Start Time  1510    PT Stop Time  1555    PT Time Calculation (min)  45 min       Past Medical History:  Diagnosis Date  . Anemia   . Arthritis   . Heart murmur   . History of stomach ulcers   . Hyperlipidemia   . Hypertension   . Neuropathy     Past Surgical History:  Procedure Laterality Date  . BACK SURGERY  1997   L4 L5  . CARPAL TUNNEL RELEASE Right   . COLONOSCOPY  02/2009   Dr. Laural Golden: few sigmoid diverticula, one at cecum, small submucosal lipoma at prox transverse colon  . COLONOSCOPY WITH PROPOFOL N/A 08/22/2017   Dr. Gala Romney: two tubular adenomas, sigmoid diverticulosis, colonic lipoma  . ESOPHAGOGASTRODUODENOSCOPY (EGD) WITH PROPOFOL N/A 08/22/2017   Dr. Gala Romney: normal esophagus, normal stomach, normal duodenum  . POLYPECTOMY  08/22/2017   Procedure: POLYPECTOMY;  Surgeon: Daneil Dolin, MD;  [facility];  Service: Endoscopy;;  sigmoid    There were no vitals filed for this visit.   Subjective Assessment - 01/22/18 1520    Subjective  Charles Ayala states that his Lt shoulder started hurting him over a year ago.  He was referred to therapy and did well and was discharged .  Approximately 4 weeks later he was playing with his granddaughter and his arm got caught up in her dress pulling his arm and he was back to square one.  He kept thinking that he could  get the shoulder back to normal on his own but he is continuing to have significant pain at night therefore he is being referred back to therapy.      Pertinent History  back  pain; back surgery     Limitations  Lifting;House hold activities    Patient Stated Goals  to be able to move his arm without pain, sleep better, lift     Currently in Pain?  No/denies no pain unless he uses his arm; worst 10/10; best 0/10     Pain Location  Shoulder    Pain Orientation  Left    Pain Descriptors / Indicators  Aching    Pain Type  Chronic pain    Pain Onset  More than a month ago    Pain Frequency  Intermittent    Aggravating Factors   lying in bed; lifting     Pain Relieving Factors  nothing    Effect of Pain on Daily Activities  limits.          [facility] PT Assessment - 01/22/18 0001      Assessment   Medical Diagnosis  Lt shoulder pain    Referring Provider  Caren Macadam    Onset Date/Surgical Date  12/22/16    Next MD Visit  not scheduled     Prior Therapy  none      Precautions   Precautions  None      Restrictions   Weight Bearing Restrictions  No      Balance Screen   Has the patient fallen in the past 6 months  No    Has the patient had a decrease in activity level because of a fear of falling?   No    Is the patient reluctant to leave their home because of a fear of falling?   No      Prior Function   Level of Independence  Independent    Vocation  Retired      Charity fundraiser Status  Within Functional Limits for tasks assessed      Observation/Other Assessments   Focus on Therapeutic Outcomes (FOTO)   54      ROM / Strength   AROM / PROM / Strength  AROM;Strength      AROM   AROM Assessment Site  Shoulder    Right/Left Shoulder  Right;Left    Left Shoulder Flexion  120 Degrees    Left Shoulder ABduction  90 Degrees    Left Shoulder Internal Rotation  70 Degrees    Left Shoulder External Rotation  60 Degrees      Strength   Strength Assessment Site  Shoulder;Elbow    Right/Left Shoulder  Right;Left    Right Shoulder Flexion  5/5    Right Shoulder Extension  5/5    Right Shoulder ABduction  5/5    Right Shoulder  Internal Rotation  5/5    Right Shoulder External Rotation  5/5    Left Shoulder Flexion  4-/5    Left Shoulder Extension  5/5    Left Shoulder ABduction  4+/5    Left Shoulder Internal Rotation  5/5    Left Shoulder External Rotation  4/5                Objective measurements completed on examination: See above findings.      [facility] Adult PT Treatment/Exercise - 01/22/18 0001      Exercises   Exercises  Shoulder      Shoulder Exercises: Supine   External Rotation  AAROM;Left;5 reps    External Rotation Limitations  wand     Internal Rotation  AAROM;Left;5 reps    Internal Rotation Limitations  wamd    Flexion  AAROM;Left;5 reps    Flexion Limitations  wand    ABduction  AAROM;Left;5 reps    ABduction Limitations  wand     Other Supine Exercises  shoulder retraction x 5              PT Education - 01/22/18 1549    Education Details  HEP    Person(s) Educated  Patient    Methods  Explanation;Verbal cues;Handout    Comprehension  Verbalized understanding;Returned demonstration       PT Short Term Goals - 01/22/18 1604      PT SHORT TERM GOAL #1   Title  Pt pain in his lt shoulder to decrease to no greater than a 6/10 so that pt is only being woke from his sleep 4 or less nights per week     Time  3    Period  Weeks    Status  New    Target Date  02/12/18      PT SHORT TERM GOAL #2   Title  PT to be able to lift a 5 pound item into a shelf that his shoulder height without increased pain     Time  3    Period  Weeks  Status  New      PT SHORT TERM GOAL #3   Title  Pt to be able to lift a pot of water off the stove using both hands without increased pain.     Time  3    Period  Weeks    Status  New        PT Long Term Goals - 01/22/18 1607      PT LONG TERM GOAL #1   Title  Pt pain in his left shoulder to be no greater than a 2/10 to allow pt to not be waking up during the night due to shoulder pain     Time  6    Period  Weeks     Status  New    Target Date  03/05/18      PT LONG TERM GOAL #2   Title  Pt strength in his left shoulder to have increased to allow pt to lift ten pounds onto a shelf that is shoulder height.    Time  6    Period  Weeks    Status  New      PT LONG TERM GOAL #3   Title  PT to be able to complete yard work/ house painting without experiencing increased pain in his left shoulder.     Time  6    Period  Weeks    Status  New      PT LONG TERM GOAL #4   Title  Pt ROM to have improved to allow pt to reach overhead for tasks such as changing light bulbs without difficulty     Time  6    Period  Weeks    Status  New             Plan - 01/22/18 1558    Clinical Impression Statement  Mr. Giller is a 67 yo male who has had chronic Lt shoulder pain.  He had a MRI a year ago which showed a partial rotator cuff tear.  He went thru rehab and did well.  Unfortunately, approximately a month after rehab he was playing with his granddaughter and reinjured his shoulder.  He has been trying to self rehab since this time but his pain when he goes to use his arm and at night has not improved therefore he is being referred to skilled therapy.  Evaluation demonstrates postural changes, decreased ROM, decreased strength and decreased functional use of pt Lt UE.  Mr. Ehly will benefit from skilled physical therapy to address these issues and maximize his functional ability.    History and Personal Factors relevant to plan of care:  back surgery, partial lt rotator cuff tear.     Clinical Presentation  Stable    Clinical Decision Making  Moderate    Rehab Potential  Good    PT Frequency  3x / week    PT Duration  6 weeks    PT Treatment/Interventions  ADLs/Self Care Home Management;Cryotherapy;Ultrasound;Therapeutic activities;Therapeutic exercise;Patient/family education;Passive range of motion;Manual techniques    PT Next Visit Plan  begin gentle PROM to shoulder, wall walks, Elbow flexion/extension  with 3# wt. Progress strength and ROM of Lt UE as able.    PT Home Exercise Plan  EVAL: wand flexion, abduction, IR/ER    Consulted and Agree with Plan of Care  Patient       Patient will benefit from skilled therapeutic intervention in order to improve the following deficits and impairments:  Decreased  activity tolerance, Decreased range of motion, Decreased strength, Increased fascial restricitons, Impaired perceived functional ability, Impaired flexibility, Impaired UE functional use, Pain  Visit Diagnosis: Stiffness of left shoulder, not elsewhere classified - Plan: PT plan of care cert/re-cert  Other symptoms and signs involving the musculoskeletal system - Plan: PT plan of care cert/re-cert  Chronic left shoulder pain - Plan: PT plan of care cert/re-cert     Problem List Patient Active Problem List   Diagnosis Date Noted  . Frequent stools 12/11/2017  . Loss of weight 12/11/2017  . Tubular adenoma of colon 09/02/2017  . Meralgia paresthetica of right side 08/26/2017  . Hyperlipidemia LDL goal <100 07/31/2017  . High risk medication use 07/31/2017  . Encounter for hepatitis C screening test for low risk patient 07/31/2017  . Hammer toe of left foot 07/31/2017  . Normocytic anemia 07/22/2017  . Nausea without vomiting 07/22/2017  . Intervertebral disc disorders with myelopathy of lumbar region 02/26/2017  . Spondylolysis of lumbar region 02/26/2017  . Essential hypertension 02/22/2017  . Benign prostatic hyperplasia without lower urinary tract symptoms 02/22/2017    Rayetta Humphrey, PT CLT [phone] 01/22/2018, 4:14 PM  [facility] [address] [place], [place], [address] Phone: [phone]   Fax:  [phone]  Name: Frances Joynt MRN: [id number] Date of Birth: Oct 18, 1950

## 2018-01-22 NOTE — Telephone Encounter (Signed)
See phone note. Has been changed. Advise patient. Charles Ayala. Mannie Stabile, MD

## 2018-01-22 NOTE — Telephone Encounter (Signed)
Please correct the PT\OT order as it should be for the LEFT Shoulder--however it shows RIGHT.Marland Kitchen Pt has an appt today

## 2018-01-22 NOTE — Telephone Encounter (Signed)
Per Rennis Petty request referral for OT instead of PT entered per Dr.Hagler.

## 2018-01-22 NOTE — Telephone Encounter (Signed)
Please advise that the referral has been changed to the left shoulder. Gwen Her. Mannie Stabile, MD

## 2018-01-27 ENCOUNTER — Ambulatory Visit (HOSPITAL_COMMUNITY): Payer: PPO

## 2018-01-27 DIAGNOSIS — M25612 Stiffness of left shoulder, not elsewhere classified: Secondary | ICD-10-CM

## 2018-01-27 DIAGNOSIS — R29898 Other symptoms and signs involving the musculoskeletal system: Secondary | ICD-10-CM

## 2018-01-27 DIAGNOSIS — M25512 Pain in left shoulder: Secondary | ICD-10-CM

## 2018-01-27 DIAGNOSIS — G8929 Other chronic pain: Secondary | ICD-10-CM

## 2018-01-27 NOTE — Therapy (Signed)
[facility] [address] [place], [place], [address] Phone: [phone]   Fax:  [phone]  Physical Therapy Treatment  Patient Details  Name: Charles Ayala MRN: [id number] Date of Birth: August 21, 1950 Referring Provider: Caren Macadam   Encounter Date: 01/27/2018  PT End of Session - 01/27/18 1223    Visit Number  2    Number of Visits  18    Date for PT Re-Evaluation  02/21/18    PT Start Time  1034    PT Stop Time  1118    PT Time Calculation (min)  44 min    Activity Tolerance  Patient tolerated treatment well;Patient limited by pain    Behavior During Therapy  [facility] for tasks assessed/performed       Past Medical History:  Diagnosis Date  . Anemia   . Arthritis   . Heart murmur   . History of stomach ulcers   . Hyperlipidemia   . Hypertension   . Neuropathy     Past Surgical History:  Procedure Laterality Date  . BACK SURGERY  1997   L4 L5  . CARPAL TUNNEL RELEASE Right   . COLONOSCOPY  02/2009   Dr. Laural Golden: few sigmoid diverticula, one at cecum, small submucosal lipoma at prox transverse colon  . COLONOSCOPY WITH PROPOFOL N/A 08/22/2017   Dr. Gala Romney: two tubular adenomas, sigmoid diverticulosis, colonic lipoma  . ESOPHAGOGASTRODUODENOSCOPY (EGD) WITH PROPOFOL N/A 08/22/2017   Dr. Gala Romney: normal esophagus, normal stomach, normal duodenum  . POLYPECTOMY  08/22/2017   Procedure: POLYPECTOMY;  Surgeon: Daneil Dolin, MD;  [facility];  Service: Endoscopy;;  sigmoid    There were no vitals filed for this visit.  Subjective Assessment - 01/27/18 1221    Subjective  Patient reoprts no pain currently but continues to wake up several times each night with Lt shoulder pain.    Pertinent History  back pain; back surgery     Limitations  Lifting;House hold activities    Patient Stated Goals  to be able to move his arm without pain, sleep better, lift     Currently in Pain?  No/denies    Pain Onset  More than a month  ago                       [facility] Adult PT Treatment/Exercise - 01/27/18 0001      Shoulder Exercises: Supine   External Rotation  AAROM;Left;10 reps    External Rotation Limitations  wand     Internal Rotation  AAROM;Left;10 reps    Internal Rotation Limitations  wand    Flexion  AAROM;Left;10 reps    Flexion Limitations  wand    ABduction  AAROM;Left;10 reps    ABduction Limitations  wand       Shoulder Exercises: ROM/Strengthening   Other ROM/Strengthening Exercises  flex and abd wall walks x5 RT      Manual Therapy   Manual Therapy  Joint mobilization;Soft tissue mobilization;Passive ROM    Joint Mobilization  GH joint AP/PA/CW/CCW    Soft tissue mobilization  subscap muscle    Passive ROM  flex/ext/er/ir at 45 degrees             PT Education - 01/27/18 1222    Education Details  reviewed HEP, initial evaluation goals and objectives. Clinical reasoning for PROM, AAROM, AROM. Pillow use supine and side-lying to reduce pain for sleeping.    Person(s) Educated  Patient;Spouse  Methods  Explanation;Handout;Verbal cues;Tactile cues    Comprehension  Verbalized understanding;Verbal cues required;Tactile cues required;Need further instruction       PT Short Term Goals - 01/22/18 1604      PT SHORT TERM GOAL #1   Title  Pt pain in his lt shoulder to decrease to no greater than a 6/10 so that pt is only being woke from his sleep 4 or less nights per week     Time  3    Period  Weeks    Status  New    Target Date  02/12/18      PT SHORT TERM GOAL #2   Title  PT to be able to lift a 5 pound item into a shelf that his shoulder height without increased pain     Time  3    Period  Weeks    Status  New      PT SHORT TERM GOAL #3   Title  Pt to be able to lift a pot of water off the stove using both hands without increased pain.     Time  3    Period  Weeks    Status  New        PT Long Term Goals - 01/22/18 1607      PT LONG TERM GOAL #1    Title  Pt pain in his left shoulder to be no greater than a 2/10 to allow pt to not be waking up during the night due to shoulder pain     Time  6    Period  Weeks    Status  New    Target Date  03/05/18      PT LONG TERM GOAL #2   Title  Pt strength in his left shoulder to have increased to allow pt to lift ten pounds onto a shelf that is shoulder height.    Time  6    Period  Weeks    Status  New      PT LONG TERM GOAL #3   Title  PT to be able to complete yard work/ house painting without experiencing increased pain in his left shoulder.     Time  6    Period  Weeks    Status  New      PT LONG TERM GOAL #4   Title  Pt ROM to have improved to allow pt to reach overhead for tasks such as changing light bulbs without difficulty     Time  6    Period  Weeks    Status  New            Plan - 01/27/18 1224    Clinical Impression Statement  Session focused on reviewing initial eval goals and HEP for correct performance. Required verbal and tactile cues to wand exercises and proper scapular retraction; PROM, joint glides and soft tissue massage performed with cues for patient to relax and allow PROM. ROM did improve with passive movement. Continue with current plan; progress exercises into isometric/isotonic exercises as able. Review HEP.    Rehab Potential  Good    PT Frequency  3x / week    PT Duration  6 weeks    PT Treatment/Interventions  ADLs/Self Care Home Management;Cryotherapy;Ultrasound;Therapeutic activities;Therapeutic exercise;Patient/family education;Passive range of motion;Manual techniques    PT Next Visit Plan  continue gentle PROM to shoulder, wall walks, Elbow flexion/extension with 3# wt. Progress strength and ROM of Lt UE as able.  Review HEP; assess pillow use at night.    PT Home Exercise Plan  EVAL: wand flexion, abduction, IR/ER; 01/27/18 - supine and side-lying pillow use at night.    Consulted and Agree with Plan of Care  Patient       Patient will  benefit from skilled therapeutic intervention in order to improve the following deficits and impairments:  Decreased activity tolerance, Decreased range of motion, Decreased strength, Increased fascial restricitons, Impaired perceived functional ability, Impaired flexibility, Impaired UE functional use, Pain  Visit Diagnosis: Stiffness of left shoulder, not elsewhere classified  Other symptoms and signs involving the musculoskeletal system  Chronic left shoulder pain     Problem List Patient Active Problem List   Diagnosis Date Noted  . Frequent stools 12/11/2017  . Loss of weight 12/11/2017  . Tubular adenoma of colon 09/02/2017  . Meralgia paresthetica of right side 08/26/2017  . Hyperlipidemia LDL goal <100 07/31/2017  . High risk medication use 07/31/2017  . Encounter for hepatitis C screening test for low risk patient 07/31/2017  . Hammer toe of left foot 07/31/2017  . Normocytic anemia 07/22/2017  . Nausea without vomiting 07/22/2017  . Intervertebral disc disorders with myelopathy of lumbar region 02/26/2017  . Spondylolysis of lumbar region 02/26/2017  . Essential hypertension 02/22/2017  . Benign prostatic hyperplasia without lower urinary tract symptoms 02/22/2017    Floria Raveling. Hartnett-Rands, MS, PT [facility] #[id number] 01/27/2018, 12:29 PM  [facility] [address] [place], [place], [address] Phone: [phone]   Fax:  [phone]  Name: Jesiah Grismer MRN: [id number] Date of Birth: May 20, 1951

## 2018-01-28 ENCOUNTER — Encounter

## 2018-01-29 ENCOUNTER — Encounter (HOSPITAL_COMMUNITY): Payer: Self-pay

## 2018-01-29 ENCOUNTER — Ambulatory Visit (HOSPITAL_COMMUNITY): Payer: PPO

## 2018-01-29 DIAGNOSIS — M25612 Stiffness of left shoulder, not elsewhere classified: Secondary | ICD-10-CM | POA: Diagnosis not present

## 2018-01-29 DIAGNOSIS — M25512 Pain in left shoulder: Secondary | ICD-10-CM

## 2018-01-29 DIAGNOSIS — G8929 Other chronic pain: Secondary | ICD-10-CM

## 2018-01-29 DIAGNOSIS — R29898 Other symptoms and signs involving the musculoskeletal system: Secondary | ICD-10-CM

## 2018-01-29 NOTE — Therapy (Signed)
[facility] [address] [place], [place], [address] Phone: [phone]   Fax:  [phone]  Physical Therapy Treatment  Patient Details  Name: Charles Ayala MRN: [id number] Date of Birth: Oct 19, 1950 Referring Provider: Caren Macadam   Encounter Date: 01/29/2018  PT End of Session - 01/29/18 1440    Visit Number  3    Number of Visits  18    Date for PT Re-Evaluation  02/21/18    Authorization Type  [employer]    Authorization Time Period  Cert: 5/1--> 7/00/17    Authorization - Visit Number  3    Authorization - Number of Visits  10    PT Start Time  4944    PT Stop Time  9675    PT Time Calculation (min)  43 min    Activity Tolerance  Patient tolerated treatment well;No increased pain    Behavior During Therapy  WFL for tasks assessed/performed       Past Medical History:  Diagnosis Date  . Anemia   . Arthritis   . Heart murmur   . History of stomach ulcers   . Hyperlipidemia   . Hypertension   . Neuropathy     Past Surgical History:  Procedure Laterality Date  . BACK SURGERY  1997   L4 L5  . CARPAL TUNNEL RELEASE Right   . COLONOSCOPY  02/2009   Dr. Laural Golden: few sigmoid diverticula, one at cecum, small submucosal lipoma at prox transverse colon  . COLONOSCOPY WITH PROPOFOL N/A 08/22/2017   Dr. Gala Romney: two tubular adenomas, sigmoid diverticulosis, colonic lipoma  . ESOPHAGOGASTRODUODENOSCOPY (EGD) WITH PROPOFOL N/A 08/22/2017   Dr. Gala Romney: normal esophagus, normal stomach, normal duodenum  . POLYPECTOMY  08/22/2017   Procedure: POLYPECTOMY;  Surgeon: Daneil Dolin, MD;  [facility];  Service: Endoscopy;;  sigmoid    There were no vitals filed for this visit.  Subjective Assessment - 01/29/18 1438    Subjective  Pt stated he continues to have difficulty sleeping at night due to Lt shoulder pain, stated he used pillows for support.  Pain scale 4/10 intermittent achey shoulder pain.      Patient  Stated Goals  to be able to move his arm without pain, sleep better, lift     Currently in Pain?  Yes    Pain Score  4     Pain Location  Shoulder    Pain Orientation  Left    Pain Descriptors / Indicators  Aching    Pain Onset  More than a month ago    Pain Frequency  Intermittent    Aggravating Factors   lying in bed; lifting     Pain Relieving Factors  nothing    Effect of Pain on Daily Activities  limits                       [facility] Adult PT Treatment/Exercise - 01/29/18 0001      Exercises   Exercises  Shoulder      Shoulder Exercises: Seated   Other Seated Exercises  bicep 3# 3x 10    Other Seated Exercises  scapular retraction with max cueing      Shoulder Exercises: Standing   Other Standing Exercises  elbow extension 10x with3#      Shoulder Exercises: Pulleys   Flexion  2 minutes    Flexion Limitations  cueing for form    ABduction  2 minutes  ABduction Limitations  cueing for form      Shoulder Exercises: ROM/Strengthening   Other ROM/Strengthening Exercises  flex and abd wall walks x15 RT      Manual Therapy   Manual Therapy  Joint mobilization;Soft tissue mobilization;Passive ROM    Manual therapy comments  Manual therapy completed prior to exercises.    Joint Mobilization  GH joint AP/PA/CW/CCW    Soft tissue mobilization  subscap muscle    Passive ROM  flex/ext/er/ir at 45 degrees               PT Short Term Goals - 01/22/18 1604      PT SHORT TERM GOAL #1   Title  Pt pain in his lt shoulder to decrease to no greater than a 6/10 so that pt is only being woke from his sleep 4 or less nights per week     Time  3    Period  Weeks    Status  New    Target Date  02/12/18      PT SHORT TERM GOAL #2   Title  PT to be able to lift a 5 pound item into a shelf that his shoulder height without increased pain     Time  3    Period  Weeks    Status  New      PT SHORT TERM GOAL #3   Title  Pt to be able to lift a pot of water off  the stove using both hands without increased pain.     Time  3    Period  Weeks    Status  New        PT Long Term Goals - 01/22/18 1607      PT LONG TERM GOAL #1   Title  Pt pain in his left shoulder to be no greater than a 2/10 to allow pt to not be waking up during the night due to shoulder pain     Time  6    Period  Weeks    Status  New    Target Date  03/05/18      PT LONG TERM GOAL #2   Title  Pt strength in his left shoulder to have increased to allow pt to lift ten pounds onto a shelf that is shoulder height.    Time  6    Period  Weeks    Status  New      PT LONG TERM GOAL #3   Title  PT to be able to complete yard work/ house painting without experiencing increased pain in his left shoulder.     Time  6    Period  Weeks    Status  New      PT LONG TERM GOAL #4   Title  Pt ROM to have improved to allow pt to reach overhead for tasks such as changing light bulbs without difficulty     Time  6    Period  Weeks    Status  New            Plan - 01/29/18 1459    Clinical Impression Statement  Session focus on shoulder mobility and arm strengthening.  Added pullies and arm slides on wall for shoulder mobility as well as manual PROM.  Began bicep/tricep strengthening exercises wiht 3#.  Pt required cueing to reduce compensation and achieve proper movements wiht abduction activities and heavy cueing to acheive scapular retraction.  No reports  of increased pain through session.  Pt stated he continues to have most difficutly getting comfortable at night, has began to support shoulder with pillows but feels it may be too big, plans on trial wiht other pillows at home.    Rehab Potential  Good    PT Frequency  3x / week    PT Duration  6 weeks    PT Treatment/Interventions  ADLs/Self Care Home Management;Cryotherapy;Ultrasound;Therapeutic activities;Therapeutic exercise;Patient/family education;Passive range of motion;Manual techniques    PT Next Visit Plan  continue  gentle PROM to shoulder, wall walks, Elbow flexion/extension with 3# wt. Add cervical and scapular retraction exercises next session.  Progress strength and ROM of Lt UE as able. Review HEP; assess pillow use at night.    PT Home Exercise Plan  EVAL: wand flexion, abduction, IR/ER; 01/27/18 - supine and side-lying pillow use at night.       Patient will benefit from skilled therapeutic intervention in order to improve the following deficits and impairments:  Decreased activity tolerance, Decreased range of motion, Decreased strength, Increased fascial restricitons, Impaired perceived functional ability, Impaired flexibility, Impaired UE functional use, Pain  Visit Diagnosis: Stiffness of left shoulder, not elsewhere classified  Other symptoms and signs involving the musculoskeletal system  Chronic left shoulder pain     Problem List Patient Active Problem List   Diagnosis Date Noted  . Frequent stools 12/11/2017  . Loss of weight 12/11/2017  . Tubular adenoma of colon 09/02/2017  . Meralgia paresthetica of right side 08/26/2017  . Hyperlipidemia LDL goal <100 07/31/2017  . High risk medication use 07/31/2017  . Encounter for hepatitis C screening test for low risk patient 07/31/2017  . Hammer toe of left foot 07/31/2017  . Normocytic anemia 07/22/2017  . Nausea without vomiting 07/22/2017  . Intervertebral disc disorders with myelopathy of lumbar region 02/26/2017  . Spondylolysis of lumbar region 02/26/2017  . Essential hypertension 02/22/2017  . Benign prostatic hyperplasia without lower urinary tract symptoms 02/22/2017   Ihor Austin, LPTA; CBIS [phone]  Aldona Lento 01/29/2018, 6:29 PM  [facility] [place], [place], [address] Phone: [phone]   Fax:  [phone]  Name: Chidubem Chaires MRN: [id number] Date of Birth: [birth year]

## 2018-01-31 ENCOUNTER — Encounter (HOSPITAL_COMMUNITY): Payer: PPO | Admitting: Physical Therapy

## 2018-02-03 ENCOUNTER — Ambulatory Visit (HOSPITAL_COMMUNITY): Payer: PPO

## 2018-02-05 ENCOUNTER — Ambulatory Visit (HOSPITAL_COMMUNITY): Payer: PPO

## 2018-02-05 ENCOUNTER — Encounter (HOSPITAL_COMMUNITY): Payer: Self-pay

## 2018-02-05 DIAGNOSIS — M25612 Stiffness of left shoulder, not elsewhere classified: Secondary | ICD-10-CM | POA: Diagnosis not present

## 2018-02-05 DIAGNOSIS — G8929 Other chronic pain: Secondary | ICD-10-CM

## 2018-02-05 DIAGNOSIS — M25512 Pain in left shoulder: Secondary | ICD-10-CM

## 2018-02-05 DIAGNOSIS — R29898 Other symptoms and signs involving the musculoskeletal system: Secondary | ICD-10-CM

## 2018-02-05 NOTE — Therapy (Signed)
[place], [place], [address] Phone: [phone]   Fax:  [phone]  Physical Therapy Treatment  Patient Details  Name: Charles Ayala MRN: [id number] Date of Birth: 06/19/1950 Referring Provider: Caren Macadam   Encounter Date: 02/05/2018  PT End of Session - 02/05/18 1307    Visit Number  4    Number of Visits  18    Date for PT Re-Evaluation  02/21/18    Authorization Type  [employer]    Authorization Time Period  Cert: 5/6--> 08/30/95    Authorization - Visit Number  4    Authorization - Number of Visits  10    PT Start Time  1301    PT Stop Time  0263    PT Time Calculation (min)  48 min    Activity Tolerance  Patient tolerated treatment well;No increased pain    Behavior During Therapy  WFL for tasks assessed/performed       Past Medical History:  Diagnosis Date  . Anemia   . Arthritis   . Heart murmur   . History of stomach ulcers   . Hyperlipidemia   . Hypertension   . Neuropathy     Past Surgical History:  Procedure Laterality Date  . BACK SURGERY  1997   L4 L5  . CARPAL TUNNEL RELEASE Right   . COLONOSCOPY  02/2009   Dr. Laural Golden: few sigmoid diverticula, one at cecum, small submucosal lipoma at prox transverse colon  . COLONOSCOPY WITH PROPOFOL N/A 08/22/2017   Dr. Gala Romney: two tubular adenomas, sigmoid diverticulosis, colonic lipoma  . ESOPHAGOGASTRODUODENOSCOPY (EGD) WITH PROPOFOL N/A 08/22/2017   Dr. Gala Romney: normal esophagus, normal stomach, normal duodenum  . POLYPECTOMY  08/22/2017   Procedure: POLYPECTOMY;  Surgeon: Daneil Dolin, MD;  [facility];  Service: Endoscopy;;  sigmoid    There were no vitals filed for this visit.  Subjective Assessment - 02/05/18 1305    Subjective  Pt stated he has increased pain in his back, stated Lt shoulder was sore following last session.  Reports some pain wiht the cane exercises at home, wishes to review HEP.  Reports improved  sleep wiht the pillow assistance and able to ride mower with less pain with rotations.    Patient Stated Goals  to be able to move his arm without pain, sleep better, lift     Currently in Pain?  Yes    Pain Score  2     Pain Location  Back    Pain Orientation  Mid    Pain Descriptors / Indicators  Sore;Aching    Pain Type  Chronic pain    Pain Onset  More than a month ago    Pain Frequency  Intermittent    Aggravating Factors   lying in bed' lifting    Pain Relieving Factors  nothing    Effect of Pain on Daily Activities  limits                       [facility] Adult PT Treatment/Exercise - 02/05/18 0001      Exercises   Exercises  Shoulder      Shoulder Exercises: Seated   Other Seated Exercises  bicep 3# 3x 15    Other Seated Exercises  cervical and scapular retraction with max cueing      Shoulder Exercises: Pulleys   Flexion  2 minutes    Flexion Limitations  cueing for  form    ABduction  2 minutes    ABduction Limitations  cueing for form      Shoulder Exercises: Therapy Ball   Flexion  Left;10 reps    ABduction  Left;10 reps    Right/Left  10 reps      Shoulder Exercises: ROM/Strengthening   Other ROM/Strengthening Exercises  flex and abd wall walks x20 RT      Manual Therapy   Manual Therapy  Joint mobilization;Soft tissue mobilization;Passive ROM    Manual therapy comments  Manual therapy completed prior to exercises.    Joint Mobilization  GH joint AP/PA/CW/CCW    Soft tissue mobilization  subscap muscle    Passive ROM  flex/ext/er/ir at 45 degrees               PT Short Term Goals - 01/22/18 1604      PT SHORT TERM GOAL #1   Title  Pt pain in his lt shoulder to decrease to no greater than a 6/10 so that pt is only being woke from his sleep 4 or less nights per week     Time  3    Period  Weeks    Status  New    Target Date  02/12/18      PT SHORT TERM GOAL #2   Title  PT to be able to lift a 5 pound item into a shelf that his  shoulder height without increased pain     Time  3    Period  Weeks    Status  New      PT SHORT TERM GOAL #3   Title  Pt to be able to lift a pot of water off the stove using both hands without increased pain.     Time  3    Period  Weeks    Status  New        PT Long Term Goals - 01/22/18 1607      PT LONG TERM GOAL #1   Title  Pt pain in his left shoulder to be no greater than a 2/10 to allow pt to not be waking up during the night due to shoulder pain     Time  6    Period  Weeks    Status  New    Target Date  03/05/18      PT LONG TERM GOAL #2   Title  Pt strength in his left shoulder to have increased to allow pt to lift ten pounds onto a shelf that is shoulder height.    Time  6    Period  Weeks    Status  New      PT LONG TERM GOAL #3   Title  PT to be able to complete yard work/ house painting without experiencing increased pain in his left shoulder.     Time  6    Period  Weeks    Status  New      PT LONG TERM GOAL #4   Title  Pt ROM to have improved to allow pt to reach overhead for tasks such as changing light bulbs without difficulty     Time  6    Period  Weeks    Status  New            Plan - 02/05/18 1330    Clinical Impression Statement  Continued session focus with shoulder mobility and arm strengthening.  Added theraball activities for shoulder  ROM.  Pt with most difficulty completing proper cervical and scapular retraction without compensation, will need further instruction on postural strengthening.  No reports of increased pain in shoulder or back through session.  Reviewed form with cane HEP exercise, pt with tendency to IR with abduction with pain, pt educated on anatomy and encouraged to complete all exercises with thumbs up.  Also encouraged to complete sitting/standing vs supine due to pt. height.      Rehab Potential  Good    PT Frequency  3x / week    PT Duration  6 weeks    PT Treatment/Interventions  ADLs/Self Care Home  Management;Cryotherapy;Ultrasound;Therapeutic activities;Therapeutic exercise;Patient/family education;Passive range of motion;Manual techniques    PT Next Visit Plan  continue gentle PROM to shoulder, wall walks, Elbow flexion/extension with 3# wt. Contine with cervical and scapular retraction exercises next session.  Progress to wall arch as able.  Progress strength and ROM of Lt UE as able. Review HEP; assess pillow use at night.    PT Home Exercise Plan  EVAL: wand flexion, abduction, IR/ER; 01/27/18 - supine and side-lying pillow use at night.       Patient will benefit from skilled therapeutic intervention in order to improve the following deficits and impairments:  Decreased activity tolerance, Decreased range of motion, Decreased strength, Increased fascial restricitons, Impaired perceived functional ability, Impaired flexibility, Impaired UE functional use, Pain  Visit Diagnosis: Stiffness of left shoulder, not elsewhere classified  Other symptoms and signs involving the musculoskeletal system  Chronic left shoulder pain  Acute pain of left shoulder     Problem List Patient Active Problem List   Diagnosis Date Noted  . Frequent stools 12/11/2017  . Loss of weight 12/11/2017  . Tubular adenoma of colon 09/02/2017  . Meralgia paresthetica of right side 08/26/2017  . Hyperlipidemia LDL goal <100 07/31/2017  . High risk medication use 07/31/2017  . Encounter for hepatitis C screening test for low risk patient 07/31/2017  . Hammer toe of left foot 07/31/2017  . Normocytic anemia 07/22/2017  . Nausea without vomiting 07/22/2017  . Intervertebral disc disorders with myelopathy of lumbar region 02/26/2017  . Spondylolysis of lumbar region 02/26/2017  . Essential hypertension 02/22/2017  . Benign prostatic hyperplasia without lower urinary tract symptoms 02/22/2017   Ihor Austin, LPTA; CBIS [phone]  Aldona Lento 02/05/2018, 1:58 PM  [place], [place], [address] Phone: [phone]   Fax:  [phone]  Name: Ngoc Daughtridge MRN: [id number] Date of Birth: 1951-02-19

## 2018-02-07 ENCOUNTER — Encounter (HOSPITAL_COMMUNITY): Payer: Self-pay

## 2018-02-07 ENCOUNTER — Ambulatory Visit (HOSPITAL_COMMUNITY): Payer: PPO

## 2018-02-07 DIAGNOSIS — G8929 Other chronic pain: Secondary | ICD-10-CM

## 2018-02-07 DIAGNOSIS — M25612 Stiffness of left shoulder, not elsewhere classified: Secondary | ICD-10-CM | POA: Diagnosis not present

## 2018-02-07 DIAGNOSIS — M25512 Pain in left shoulder: Secondary | ICD-10-CM

## 2018-02-07 DIAGNOSIS — R29898 Other symptoms and signs involving the musculoskeletal system: Secondary | ICD-10-CM

## 2018-02-07 NOTE — Therapy (Signed)
[place], [place], [address] Phone: [phone]   Fax:  [phone]  Physical Therapy Treatment  Patient Details  Name: Charles Ayala MRN: [id number] Date of Birth: 02-26-1951 Referring Provider: Caren Macadam   Encounter Date: 02/07/2018  PT End of Session - 02/07/18 1432    Visit Number  5    Number of Visits  18    Date for PT Re-Evaluation  03/05/18   [facility] 02/21/18   Authorization Type  [employer]    Authorization Time Period  Cert: 0/6--> 08/17/58    Authorization - Visit Number  5    Authorization - Number of Visits  10    PT Start Time  1093    PT Stop Time  2355    PT Time Calculation (min)  45 min    Activity Tolerance  Patient tolerated treatment well;No increased pain    Behavior During Therapy  WFL for tasks assessed/performed       Past Medical History:  Diagnosis Date  . Anemia   . Arthritis   . Heart murmur   . History of stomach ulcers   . Hyperlipidemia   . Hypertension   . Neuropathy     Past Surgical History:  Procedure Laterality Date  . BACK SURGERY  1997   L4 L5  . CARPAL TUNNEL RELEASE Right   . COLONOSCOPY  02/2009   Dr. Laural Golden: few sigmoid diverticula, one at cecum, small submucosal lipoma at prox transverse colon  . COLONOSCOPY WITH PROPOFOL N/A 08/22/2017   Dr. Gala Romney: two tubular adenomas, sigmoid diverticulosis, colonic lipoma  . ESOPHAGOGASTRODUODENOSCOPY (EGD) WITH PROPOFOL N/A 08/22/2017   Dr. Gala Romney: normal esophagus, normal stomach, normal duodenum  . POLYPECTOMY  08/22/2017   Procedure: POLYPECTOMY;  Surgeon: Daneil Dolin, MD;  [facility];  Service: Endoscopy;;  sigmoid    There were no vitals filed for this visit.  Subjective Assessment - 02/07/18 1427    Subjective  Pt stated he feels better with mechanics with HEP with cane, current pain scale 4/10 achey pain with end range movements.      Patient Stated Goals  to be able to move  his arm without pain, sleep better, lift     Currently in Pain?  Yes    Pain Score  3     Pain Location  Shoulder    Pain Orientation  Left    Pain Descriptors / Indicators  Aching;Sore    Pain Type  Chronic pain    Pain Onset  More than a month ago    Pain Frequency  Intermittent    Aggravating Factors   lying in bed, lifting    Pain Relieving Factors  nothing     Effect of Pain on Daily Activities  limits                       [facility] Adult PT Treatment/Exercise - 02/07/18 0001      Exercises   Exercises  Shoulder      Shoulder Exercises: Standing   Flexion  Both;10 reps    Flexion Limitations  wall arch 10x 5"; also reviewed ROM wiht cane    ABduction Limitations  Reviewed form with cane    Row  10 reps    Theraband Level (Shoulder Row)  Level 2 (Red)    Row Limitations  5" holds, tactile cueing for retraction    Other Standing Exercises  elbow extension 10x with3#    Other Standing Exercises  bicep curls 4# x 15      Shoulder Exercises: Pulleys   Flexion  2 minutes    Flexion Limitations  cueing for form    ABduction  2 minutes    ABduction Limitations  cueing for form      Shoulder Exercises: Therapy Ball   Flexion  Left;10 reps    ABduction  Left;10 reps    Right/Left  10 reps      Manual Therapy   Manual Therapy  Joint mobilization;Soft tissue mobilization;Passive ROM    Manual therapy comments  Manual therapy completed prior to exercises.    Joint Mobilization  GH joint AP/PA/CW/CCW    Soft tissue mobilization  subscap muscle    Passive ROM  flex/ext/er/ir at 45 degrees               PT Short Term Goals - 01/22/18 1604      PT SHORT TERM GOAL #1   Title  Pt pain in his lt shoulder to decrease to no greater than a 6/10 so that pt is only being woke from his sleep 4 or less nights per week     Time  3    Period  Weeks    Status  New    Target Date  02/12/18      PT SHORT TERM GOAL #2   Title  PT to be able to lift a 5 pound item  into a shelf that his shoulder height without increased pain     Time  3    Period  Weeks    Status  New      PT SHORT TERM GOAL #3   Title  Pt to be able to lift a pot of water off the stove using both hands without increased pain.     Time  3    Period  Weeks    Status  New        PT Long Term Goals - 01/22/18 1607      PT LONG TERM GOAL #1   Title  Pt pain in his left shoulder to be no greater than a 2/10 to allow pt to not be waking up during the night due to shoulder pain     Time  6    Period  Weeks    Status  New    Target Date  03/05/18      PT LONG TERM GOAL #2   Title  Pt strength in his left shoulder to have increased to allow pt to lift ten pounds onto a shelf that is shoulder height.    Time  6    Period  Weeks    Status  New      PT LONG TERM GOAL #3   Title  PT to be able to complete yard work/ house painting without experiencing increased pain in his left shoulder.     Time  6    Period  Weeks    Status  New      PT LONG TERM GOAL #4   Title  Pt ROM to have improved to allow pt to reach overhead for tasks such as changing light bulbs without difficulty     Time  6    Period  Weeks    Status  New            Plan - 02/07/18 1455    Clinical Impression  Statement  Continued session focus with shoulder mobility and strengthening.  Pt presents with improved shoulder mobility wiht flexion to 145 degrees (was 120) and abduction to 130 degrees (was 90 eval).  Added wall arch and began theraband rows for postural resistance.  Pt tolerated well with PROM, no reoprts of increased pain, some discomfort at IR just during end range.      Rehab Potential  Good    PT Frequency  3x / week    PT Duration  6 weeks    PT Treatment/Interventions  ADLs/Self Care Home Management;Cryotherapy;Ultrasound;Therapeutic activities;Therapeutic exercise;Patient/family education;Passive range of motion;Manual techniques    PT Next Visit Plan  Add pec stretch next session.   continue gentle PROM to shoulder and arm strengthening. Contine with cervical and scapular retraction exercises next session.  Progress to wall arch as able.  Progress strength and ROM of Lt UE as able. Review HEP; assess pillow use at night.    PT Home Exercise Plan  EVAL: wand flexion, abduction, IR/ER; 01/27/18 - supine and side-lying pillow use at night.       Patient will benefit from skilled therapeutic intervention in order to improve the following deficits and impairments:  Decreased activity tolerance, Decreased range of motion, Decreased strength, Increased fascial restricitons, Impaired perceived functional ability, Impaired flexibility, Impaired UE functional use, Pain  Visit Diagnosis: Stiffness of left shoulder, not elsewhere classified  Other symptoms and signs involving the musculoskeletal system  Chronic left shoulder pain     Problem List Patient Active Problem List   Diagnosis Date Noted  . Frequent stools 12/11/2017  . Loss of weight 12/11/2017  . Tubular adenoma of colon 09/02/2017  . Meralgia paresthetica of right side 08/26/2017  . Hyperlipidemia LDL goal <100 07/31/2017  . High risk medication use 07/31/2017  . Encounter for hepatitis C screening test for low risk patient 07/31/2017  . Hammer toe of left foot 07/31/2017  . Normocytic anemia 07/22/2017  . Nausea without vomiting 07/22/2017  . Intervertebral disc disorders with myelopathy of lumbar region 02/26/2017  . Spondylolysis of lumbar region 02/26/2017  . Essential hypertension 02/22/2017  . Benign prostatic hyperplasia without lower urinary tract symptoms 02/22/2017   Ihor Austin, LPTA; CBIS [phone]  Aldona Lento 02/07/2018, 3:35 PM  [place], [place], [address] Phone: [phone]   Fax:  [phone]  Name: Charles Ayala MRN: [id number] Date of Birth: July 09, 1950

## 2018-02-10 ENCOUNTER — Ambulatory Visit (HOSPITAL_COMMUNITY): Payer: PPO | Admitting: Physical Therapy

## 2018-02-10 ENCOUNTER — Telehealth (HOSPITAL_COMMUNITY): Payer: Self-pay | Admitting: Physical Therapy

## 2018-02-10 DIAGNOSIS — M25612 Stiffness of left shoulder, not elsewhere classified: Secondary | ICD-10-CM

## 2018-02-10 DIAGNOSIS — R29898 Other symptoms and signs involving the musculoskeletal system: Secondary | ICD-10-CM

## 2018-02-10 DIAGNOSIS — G8929 Other chronic pain: Secondary | ICD-10-CM

## 2018-02-10 DIAGNOSIS — M25512 Pain in left shoulder: Secondary | ICD-10-CM

## 2018-02-10 NOTE — Patient Instructions (Signed)
Scapular Adduction: Standing    Arms up, little fingers on wall, thumbs out. Squeeze shoulder blades together, bringing arms out from wall. Keep head and pelvis aligned. Do not arch back. Do _10__ times, _2__ times per day.   CHEST: Doorway, Bilateral - Standing    Standing in doorway, place hands on wall with elbows bent at shoulder height. Lean forward. Hold _30__ seconds._3_ sets per dayCopyright  [employer]. All rights reserved.

## 2018-02-10 NOTE — Telephone Encounter (Signed)
Patient have another MD appt.

## 2018-02-10 NOTE — Therapy (Signed)
[place], [place], [address] Phone: [phone]   Fax:  [phone]  Physical Therapy Treatment  Patient Details  Name: Charles Ayala MRN: [id number] Date of Birth: 07/01/1950 Referring Provider: Caren Macadam   Encounter Date: 02/10/2018  PT End of Session - 02/10/18 1745    Visit Number  6    Number of Visits  18    Date for PT Re-Evaluation  03/05/18   Minireassess 02/21/18   Authorization Type  [employer]    Authorization Time Period  Cert: 8/1--> 2/75/17    Authorization - Visit Number  6    Authorization - Number of Visits  10    PT Start Time  0017    PT Stop Time  1515    PT Time Calculation (min)  40 min    Activity Tolerance  Patient tolerated treatment well;No increased pain    Behavior During Therapy  WFL for tasks assessed/performed       Past Medical History:  Diagnosis Date  . Anemia   . Arthritis   . Heart murmur   . History of stomach ulcers   . Hyperlipidemia   . Hypertension   . Neuropathy     Past Surgical History:  Procedure Laterality Date  . BACK SURGERY  1997   L4 L5  . CARPAL TUNNEL RELEASE Right   . COLONOSCOPY  02/2009   Dr. Laural Golden: few sigmoid diverticula, one at cecum, small submucosal lipoma at prox transverse colon  . COLONOSCOPY WITH PROPOFOL N/A 08/22/2017   Dr. Gala Romney: two tubular adenomas, sigmoid diverticulosis, colonic lipoma  . ESOPHAGOGASTRODUODENOSCOPY (EGD) WITH PROPOFOL N/A 08/22/2017   Dr. Gala Romney: normal esophagus, normal stomach, normal duodenum  . POLYPECTOMY  08/22/2017   Procedure: POLYPECTOMY;  Surgeon: Daneil Dolin, MD;  [facility];  Service: Endoscopy;;  sigmoid    There were no vitals filed for this visit.  Subjective Assessment - 02/10/18 1742    Subjective  Pt states he feels it's getting better.  sTates he can reach his arm across his back further.      Currently in Pain?  Yes    Pain Score  2     Pain Location   Shoulder    Pain Orientation  Left                       [facility] Adult PT Treatment/Exercise - 02/10/18 0001      Shoulder Exercises: Seated   Other Seated Exercises  bicep 4# 2x 15      Shoulder Exercises: Standing   Flexion  Both;15 reps    Flexion Limitations  wall arch 10x 5" and UE flexion with back against the wall 10 reps    Row  15 reps    Theraband Level (Shoulder Row)  Level 2 (Red)    Other Standing Exercises  triceps 10x with 4#    Other Standing Exercises  doorway stretch 3X30" for pecs      Shoulder Exercises: Pulleys   Flexion  2 minutes    Flexion Limitations  cueing for form    ABduction  2 minutes    ABduction Limitations  cueing for form      Shoulder Exercises: Therapy Ball   Flexion  Left;10 reps    ABduction  Left;10 reps    Right/Left  10 reps             PT Education -  02/10/18 1744    Education Details  updated HEP to include UE flexion against wall, UE wall arch and doorway stretch for pecs    Person(s) Educated  Patient    Methods  Explanation;Demonstration;Tactile cues;Verbal cues;Handout    Comprehension  Verbalized understanding;Returned demonstration;Verbal cues required;Tactile cues required       PT Short Term Goals - 01/22/18 1604      PT SHORT TERM GOAL #1   Title  Pt pain in his lt shoulder to decrease to no greater than a 6/10 so that pt is only being woke from his sleep 4 or less nights per week     Time  3    Period  Weeks    Status  New    Target Date  02/12/18      PT SHORT TERM GOAL #2   Title  PT to be able to lift a 5 pound item into a shelf that his shoulder height without increased pain     Time  3    Period  Weeks    Status  New      PT SHORT TERM GOAL #3   Title  Pt to be able to lift a pot of water off the stove using both hands without increased pain.     Time  3    Period  Weeks    Status  New        PT Long Term Goals - 01/22/18 1607      PT LONG TERM GOAL #1   Title  Pt pain in  his left shoulder to be no greater than a 2/10 to allow pt to not be waking up during the night due to shoulder pain     Time  6    Period  Weeks    Status  New    Target Date  03/05/18      PT LONG TERM GOAL #2   Title  Pt strength in his left shoulder to have increased to allow pt to lift ten pounds onto a shelf that is shoulder height.    Time  6    Period  Weeks    Status  New      PT LONG TERM GOAL #3   Title  PT to be able to complete yard work/ house painting without experiencing increased pain in his left shoulder.     Time  6    Period  Weeks    Status  New      PT LONG TERM GOAL #4   Title  Pt ROM to have improved to allow pt to reach overhead for tasks such as changing light bulbs without difficulty     Time  6    Period  Weeks    Status  New            Plan - 02/10/18 1745    Clinical Impression Statement  contineud wtih primary focus on lt shoulder mobility.  Began with pullies to warm up shoulder and progress to stretching and strengthening proximal musculature.  Added doorway stretch with good form and pateint reporting a good stretch received.  Pt given updated HEP sheet.  manual not completed this session due to time.     Rehab Potential  Good    PT Frequency  3x / week    PT Duration  6 weeks    PT Treatment/Interventions  ADLs/Self Care Home Management;Cryotherapy;Ultrasound;Therapeutic activities;Therapeutic exercise;Patient/family education;Passive range of motion;Manual techniques  PT Next Visit Plan  Continue gentle PROM to shoulder and arm strengthening. Progress strength and ROM of Lt UE as able.    PT Home Exercise Plan  EVAL: wand flexion, abduction, IR/ER; 01/27/18 - supine and side-lying pillow use at night. 8/26: wall arch, UE flexion, doorway stretch       Patient will benefit from skilled therapeutic intervention in order to improve the following deficits and impairments:  Decreased activity tolerance, Decreased range of motion, Decreased  strength, Increased fascial restricitons, Impaired perceived functional ability, Impaired flexibility, Impaired UE functional use, Pain  Visit Diagnosis: Stiffness of left shoulder, not elsewhere classified  Other symptoms and signs involving the musculoskeletal system  Chronic left shoulder pain  Acute pain of left shoulder     Problem List Patient Active Problem List   Diagnosis Date Noted  . Frequent stools 12/11/2017  . Loss of weight 12/11/2017  . Tubular adenoma of colon 09/02/2017  . Meralgia paresthetica of right side 08/26/2017  . Hyperlipidemia LDL goal <100 07/31/2017  . High risk medication use 07/31/2017  . Encounter for hepatitis C screening test for low risk patient 07/31/2017  . Hammer toe of left foot 07/31/2017  . Normocytic anemia 07/22/2017  . Nausea without vomiting 07/22/2017  . Intervertebral disc disorders with myelopathy of lumbar region 02/26/2017  . Spondylolysis of lumbar region 02/26/2017  . Essential hypertension 02/22/2017  . Benign prostatic hyperplasia without lower urinary tract symptoms 02/22/2017   Teena Irani, PTA/CLT [phone]  Teena Irani 02/10/2018, 5:49 PM  [place], [place], [address] Phone: [phone]   Fax:  [phone]  Name: Charles Ayala MRN: [id number] Date of Birth: [birth year]

## 2018-02-12 ENCOUNTER — Ambulatory Visit (HOSPITAL_COMMUNITY): Payer: PPO | Admitting: Physical Therapy

## 2018-02-12 DIAGNOSIS — M25512 Pain in left shoulder: Secondary | ICD-10-CM

## 2018-02-12 DIAGNOSIS — M25612 Stiffness of left shoulder, not elsewhere classified: Secondary | ICD-10-CM | POA: Diagnosis not present

## 2018-02-12 DIAGNOSIS — R29898 Other symptoms and signs involving the musculoskeletal system: Secondary | ICD-10-CM

## 2018-02-12 DIAGNOSIS — G8929 Other chronic pain: Secondary | ICD-10-CM

## 2018-02-12 NOTE — Therapy (Signed)
[place], [place], [address] Phone: [phone]   Fax:  [phone]  Physical Therapy Treatment  Patient Details  Name: Charles Ayala MRN: [id number] Date of Birth: August 23, 1950 Referring Provider: Caren Macadam   Encounter Date: 02/12/2018  PT End of Session - 02/12/18 1405    Visit Number  7    Number of Visits  18    Date for PT Re-Evaluation  03/05/18   Minireassess 02/21/18   Authorization Type  [employer]    Authorization Time Period  Cert: 9/2--> 10/11/81    Authorization - Visit Number  7    Authorization - Number of Visits  10    PT Start Time  4196    PT Stop Time  2229    PT Time Calculation (min)  41 min    Activity Tolerance  Patient tolerated treatment well;No increased pain    Behavior During Therapy  WFL for tasks assessed/performed       Past Medical History:  Diagnosis Date  . Anemia   . Arthritis   . Heart murmur   . History of stomach ulcers   . Hyperlipidemia   . Hypertension   . Neuropathy     Past Surgical History:  Procedure Laterality Date  . BACK SURGERY  1997   L4 L5  . CARPAL TUNNEL RELEASE Right   . COLONOSCOPY  02/2009   Dr. Laural Golden: few sigmoid diverticula, one at cecum, small submucosal lipoma at prox transverse colon  . COLONOSCOPY WITH PROPOFOL N/A 08/22/2017   Dr. Gala Romney: two tubular adenomas, sigmoid diverticulosis, colonic lipoma  . ESOPHAGOGASTRODUODENOSCOPY (EGD) WITH PROPOFOL N/A 08/22/2017   Dr. Gala Romney: normal esophagus, normal stomach, normal duodenum  . POLYPECTOMY  08/22/2017   Procedure: POLYPECTOMY;  Surgeon: Daneil Dolin, MD;  [facility];  Service: Endoscopy;;  sigmoid    There were no vitals filed for this visit.  Subjective Assessment - 02/12/18 1310    Subjective  Pt states he only has pain with he moves his arm too soon or back over his head then has a twinge up to 5/10.  No pain otherwise.     Currently in Pain?  No/denies          [facility] PT Assessment - 02/12/18 0001      AROM   Left Shoulder Flexion  148 Degrees   was 120 degrees   Left Shoulder ABduction  145 Degrees   was 90 degrees   Left Shoulder Internal Rotation  80 Degrees   was 70 degrees   Left Shoulder External Rotation  80 Degrees   was 60 degrees                   [facility] Adult PT Treatment/Exercise - 02/12/18 0001      Shoulder Exercises: Standing   Flexion  Both;15 reps    Flexion Limitations  wall arch 15x 5" and UE flexion with back against the wall 10 reps    Row  15 reps    Theraband Level (Shoulder Row)  Level 2 (Red)    Other Standing Exercises  triceps 10x with 4#    Other Standing Exercises  doorway stretch 3X30" for pecs      Shoulder Exercises: Pulleys   Flexion  2 minutes    Flexion Limitations  cueing for form    ABduction  2 minutes    ABduction Limitations  cueing for form  Manual Therapy   Manual Therapy  Soft tissue mobilization;Passive ROM    Manual therapy comments  Manual therapy completed at end of session    Soft tissue mobilization  subscap muscle    Passive ROM  flex/ext/er/ir at 45 degrees               PT Short Term Goals - 01/22/18 1604      PT SHORT TERM GOAL #1   Title  Pt pain in his lt shoulder to decrease to no greater than a 6/10 so that pt is only being woke from his sleep 4 or less nights per week     Time  3    Period  Weeks    Status  New    Target Date  02/12/18      PT SHORT TERM GOAL #2   Title  PT to be able to lift a 5 pound item into a shelf that his shoulder height without increased pain     Time  3    Period  Weeks    Status  New      PT SHORT TERM GOAL #3   Title  Pt to be able to lift a pot of water off the stove using both hands without increased pain.     Time  3    Period  Weeks    Status  New        PT Long Term Goals - 01/22/18 1607      PT LONG TERM GOAL #1   Title  Pt pain in his left shoulder to be no greater than a 2/10 to allow pt  to not be waking up during the night due to shoulder pain     Time  6    Period  Weeks    Status  New    Target Date  03/05/18      PT LONG TERM GOAL #2   Title  Pt strength in his left shoulder to have increased to allow pt to lift ten pounds onto a shelf that is shoulder height.    Time  6    Period  Weeks    Status  New      PT LONG TERM GOAL #3   Title  PT to be able to complete yard work/ house painting without experiencing increased pain in his left shoulder.     Time  6    Period  Weeks    Status  New      PT LONG TERM GOAL #4   Title  Pt ROM to have improved to allow pt to reach overhead for tasks such as changing light bulbs without difficulty     Time  6    Period  Weeks    Status  New            Plan - 02/12/18 1405    Clinical Impression Statement  Continued with focus on Lt shoulder mobiltiy.  Contineud with pullies and instructed ROM stretches prior to manual and and PROM.  Pt able to achieve increased AROM today of 148 flexion (was 120 at eval and 145 last week) and 145 degrees Abduction (was 90 degrees at eval and 130 degrees last week).  Internal and External Rotation have also improved to 80 degrees.   Pain has become much more tolerable as well with patient demonstrating ability to reach behind his back at his point.      Rehab Potential  Good  PT Frequency  3x / week    PT Duration  6 weeks    PT Treatment/Interventions  ADLs/Self Care Home Management;Cryotherapy;Ultrasound;Therapeutic activities;Therapeutic exercise;Patient/family education;Passive range of motion;Manual techniques    PT Next Visit Plan  Continue gentle PROM to shoulder and arm strengthening. Progress strength and ROM of Lt UE as able.    PT Home Exercise Plan  EVAL: wand flexion, abduction, IR/ER; 01/27/18 - supine and side-lying pillow use at night. 8/26: wall arch, UE flexion, doorway stretch       Patient will benefit from skilled therapeutic intervention in order to improve the  following deficits and impairments:  Decreased activity tolerance, Decreased range of motion, Decreased strength, Increased fascial restricitons, Impaired perceived functional ability, Impaired flexibility, Impaired UE functional use, Pain  Visit Diagnosis: Stiffness of left shoulder, not elsewhere classified  Other symptoms and signs involving the musculoskeletal system  Chronic left shoulder pain  Acute pain of left shoulder     Problem List Patient Active Problem List   Diagnosis Date Noted  . Frequent stools 12/11/2017  . Loss of weight 12/11/2017  . Tubular adenoma of colon 09/02/2017  . Meralgia paresthetica of right side 08/26/2017  . Hyperlipidemia LDL goal <100 07/31/2017  . High risk medication use 07/31/2017  . Encounter for hepatitis C screening test for low risk patient 07/31/2017  . Hammer toe of left foot 07/31/2017  . Normocytic anemia 07/22/2017  . Nausea without vomiting 07/22/2017  . Intervertebral disc disorders with myelopathy of lumbar region 02/26/2017  . Spondylolysis of lumbar region 02/26/2017  . Essential hypertension 02/22/2017  . Benign prostatic hyperplasia without lower urinary tract symptoms 02/22/2017   Teena Irani, PTA/CLT [phone]  Teena Irani 02/12/2018, 2:50 PM  [place], [place], [address] Phone: [phone]   Fax:  [phone]  Name: Charles Ayala MRN: [id number] Date of Birth: 30-Jun-1950

## 2018-02-14 ENCOUNTER — Encounter (HOSPITAL_COMMUNITY): Payer: Self-pay

## 2018-02-14 ENCOUNTER — Ambulatory Visit (HOSPITAL_COMMUNITY): Payer: PPO

## 2018-02-14 DIAGNOSIS — R29898 Other symptoms and signs involving the musculoskeletal system: Secondary | ICD-10-CM

## 2018-02-14 DIAGNOSIS — M25612 Stiffness of left shoulder, not elsewhere classified: Secondary | ICD-10-CM | POA: Diagnosis not present

## 2018-02-14 DIAGNOSIS — G8929 Other chronic pain: Secondary | ICD-10-CM

## 2018-02-14 DIAGNOSIS — M25512 Pain in left shoulder: Secondary | ICD-10-CM

## 2018-02-14 NOTE — Therapy (Signed)
[place], [place], [address] Phone: [phone]   Fax:  [phone]  Physical Therapy Treatment  Patient Details  Name: Charles Ayala MRN: [id number] Date of Birth: [birth year] Referring Provider: Caren Macadam   Encounter Date: 02/14/2018  PT End of Session - 02/14/18 1523    Visit Number  8    Number of Visits  18    Date for PT Re-Evaluation  03/05/18    Authorization Type  [employer]    Authorization Time Period  Cert: 3/0--> 0/92/33    Authorization - Visit Number  8    Authorization - Number of Visits  10    PT Start Time  0076    PT Stop Time  2263    PT Time Calculation (min)  39 min    Activity Tolerance  Patient tolerated treatment well;No increased pain    Behavior During Therapy  WFL for tasks assessed/performed       Past Medical History:  Diagnosis Date  . Anemia   . Arthritis   . Heart murmur   . History of stomach ulcers   . Hyperlipidemia   . Hypertension   . Neuropathy     Past Surgical History:  Procedure Laterality Date  . BACK SURGERY  1997   L4 L5  . CARPAL TUNNEL RELEASE Right   . COLONOSCOPY  02/2009   Dr. Laural Golden: few sigmoid diverticula, one at cecum, small submucosal lipoma at prox transverse colon  . COLONOSCOPY WITH PROPOFOL N/A 08/22/2017   Dr. Gala Romney: two tubular adenomas, sigmoid diverticulosis, colonic lipoma  . ESOPHAGOGASTRODUODENOSCOPY (EGD) WITH PROPOFOL N/A 08/22/2017   Dr. Gala Romney: normal esophagus, normal stomach, normal duodenum  . POLYPECTOMY  08/22/2017   Procedure: POLYPECTOMY;  Surgeon: Daneil Dolin, MD;  [facility];  Service: Endoscopy;;  sigmoid    There were no vitals filed for this visit.  Subjective Assessment - 02/14/18 1520    Subjective  Pt stated he is feeling good today, no pain unless he gets to end range.  Stated he mowed yard yesterday and felt less pain completing    Patient Stated Goals  to be able to move his arm  without pain, sleep better, lift     Currently in Pain?  No/denies                       [facility] Adult PT Treatment/Exercise - 02/14/18 0001      Exercises   Exercises  Shoulder      Shoulder Exercises: Standing   External Rotation  Theraband;15 reps    Theraband Level (Shoulder External Rotation)  Level 2 (Red)    Flexion  Both;15 reps    Flexion Limitations  wall arch 15x 5" and UE flexion with back against the wall 10 reps    Row  15 reps    Theraband Level (Shoulder Row)  Level 2 (Red)    Row Limitations  5" holds, tactile cueing for retraction    Other Standing Exercises  biceps and tricep 15x 5#      Shoulder Exercises: Pulleys   Flexion  2 minutes    Flexion Limitations  cueing for form    ABduction  2 minutes    ABduction Limitations  cueing for form      Shoulder Exercises: Stretch   Corner Stretch  3 reps;30 seconds      Manual Therapy   Manual Therapy  Soft  tissue mobilization;Passive ROM    Manual therapy comments  Manual therapy completed at end of session    Joint Mobilization  [facility] joint AP/PA/CW/CCW    Soft tissue mobilization  subscap, teres muscle    Passive ROM  flex/ext/er/ir                PT Short Term Goals - 01/22/18 1604      PT SHORT TERM GOAL #1   Title  Pt pain in his lt shoulder to decrease to no greater than a 6/10 so that pt is only being woke from his sleep 4 or less nights per week     Time  3    Period  Weeks    Status  New    Target Date  02/12/18      PT SHORT TERM GOAL #2   Title  PT to be able to lift a 5 pound item into a shelf that his shoulder height without increased pain     Time  3    Period  Weeks    Status  New      PT SHORT TERM GOAL #3   Title  Pt to be able to lift a pot of water off the stove using both hands without increased pain.     Time  3    Period  Weeks    Status  New        PT Long Term Goals - 01/22/18 1607      PT LONG TERM GOAL #1   Title  Pt pain in his left shoulder to  be no greater than a 2/10 to allow pt to not be waking up during the night due to shoulder pain     Time  6    Period  Weeks    Status  New    Target Date  03/05/18      PT LONG TERM GOAL #2   Title  Pt strength in his left shoulder to have increased to allow pt to lift ten pounds onto a shelf that is shoulder height.    Time  6    Period  Weeks    Status  New      PT LONG TERM GOAL #3   Title  PT to be able to complete yard work/ house painting without experiencing increased pain in his left shoulder.     Time  6    Period  Weeks    Status  New      PT LONG TERM GOAL #4   Title  Pt ROM to have improved to allow pt to reach overhead for tasks such as changing light bulbs without difficulty     Time  6    Period  Weeks    Status  New            Plan - 02/14/18 1713    Clinical Impression Statement  Continued with primary focus on Lt shoulder mobility and progressed postural strengthening this session.  Added resistance with ER for postural strengthening.  Improved tolerance wiht PROM as well as active movements wiht no reports of pain through session.  Also improved form wiht decreased cueing for compensation with end range movements.      Rehab Potential  Good    PT Frequency  3x / week    PT Duration  6 weeks    PT Treatment/Interventions  ADLs/Self Care Home Management;Cryotherapy;Ultrasound;Therapeutic activities;Therapeutic exercise;Patient/family education;Passive range of motion;Manual techniques  PT Next Visit Plan  Add UBE next session.  Continue gentle PROM to shoulder and arm strengthening. Progress strength and ROM of Lt UE as able.    PT Home Exercise Plan  EVAL: wand flexion, abduction, IR/ER; 01/27/18 - supine and side-lying pillow use at night. 8/26: wall arch, UE flexion, doorway stretch       Patient will benefit from skilled therapeutic intervention in order to improve the following deficits and impairments:  Decreased activity tolerance, Decreased range  of motion, Decreased strength, Increased fascial restricitons, Impaired perceived functional ability, Impaired flexibility, Impaired UE functional use, Pain  Visit Diagnosis: Stiffness of left shoulder, not elsewhere classified  Chronic left shoulder pain  Acute pain of left shoulder  Other symptoms and signs involving the musculoskeletal system     Problem List Patient Active Problem List   Diagnosis Date Noted  . Frequent stools 12/11/2017  . Loss of weight 12/11/2017  . Tubular adenoma of colon 09/02/2017  . Meralgia paresthetica of right side 08/26/2017  . Hyperlipidemia LDL goal <100 07/31/2017  . High risk medication use 07/31/2017  . Encounter for hepatitis C screening test for low risk patient 07/31/2017  . Hammer toe of left foot 07/31/2017  . Normocytic anemia 07/22/2017  . Nausea without vomiting 07/22/2017  . Intervertebral disc disorders with myelopathy of lumbar region 02/26/2017  . Spondylolysis of lumbar region 02/26/2017  . Essential hypertension 02/22/2017  . Benign prostatic hyperplasia without lower urinary tract symptoms 02/22/2017   Ihor Austin, LPTA; CBIS [phone]  Aldona Lento 02/14/2018, 5:42 PM  [place], [place], [address] Phone: [phone]   Fax:  [phone]  Name: Charles Ayala MRN: [id number] Date of Birth: 1950-07-14

## 2018-02-18 ENCOUNTER — Ambulatory Visit (HOSPITAL_COMMUNITY): Payer: PPO | Attending: Family Medicine | Admitting: Physical Therapy

## 2018-02-18 DIAGNOSIS — R29898 Other symptoms and signs involving the musculoskeletal system: Secondary | ICD-10-CM | POA: Insufficient documentation

## 2018-02-18 DIAGNOSIS — M25512 Pain in left shoulder: Secondary | ICD-10-CM | POA: Diagnosis not present

## 2018-02-18 DIAGNOSIS — G8929 Other chronic pain: Secondary | ICD-10-CM | POA: Diagnosis not present

## 2018-02-18 DIAGNOSIS — M25612 Stiffness of left shoulder, not elsewhere classified: Secondary | ICD-10-CM | POA: Diagnosis not present

## 2018-02-18 NOTE — Patient Instructions (Addendum)
ROM: Anterior Glide    Lean body weight between arms until stretch is felt. Hold __60__ seconds. Repeat _3___ times per set. Do _1___ sets per session. Do _2___ sessions per day.  http://orth.exer.us/770   Copyright  VHI. All rights reserved.  ROM: Flexion    Keeping left arm on table, slide body away until stretch is felt. Hold __15__ seconds. Repeat __5__ times per set. Do __1__ sets per session. Do _2__ sessions per day.  http://orth.exer.us/756   Copyright  VHI. All rights reserved.  ROM: External Rotation    Keeping left forearm palm down on table, bend forward at waist until stretch is felt. Hold __30__ seconds. Repeat __3__ times per set. Do __1__ sets per session. Do __2__ sessions per day.  http://orth.exer.us/762   Copyright  VHI. All rights reserved.

## 2018-02-18 NOTE — Therapy (Signed)
[place], [place], [address] Phone: [phone]   Fax:  [phone]  Physical Therapy Treatment  Patient Details  Name: Charles Ayala MRN: [id number] Date of Birth: Sep 26, 1950 Referring Provider: Caren Macadam   Encounter Date: 02/18/2018  PT End of Session - 02/18/18 1521    Visit Number  9    Number of Visits  18    Date for PT Re-Evaluation  03/05/18    Authorization Type  [employer]    Authorization Time Period  Cert: 7/4--> 9/44/96    Authorization - Visit Number  9    Authorization - Number of Visits  10    PT Start Time  7591    PT Stop Time  1520    PT Time Calculation (min)  45 min    Activity Tolerance  Patient tolerated treatment well;No increased pain    Behavior During Therapy  WFL for tasks assessed/performed       Past Medical History:  Diagnosis Date  . Anemia   . Arthritis   . Heart murmur   . History of stomach ulcers   . Hyperlipidemia   . Hypertension   . Neuropathy     Past Surgical History:  Procedure Laterality Date  . BACK SURGERY  1997   L4 L5  . CARPAL TUNNEL RELEASE Right   . COLONOSCOPY  02/2009   Dr. Laural Golden: few sigmoid diverticula, one at cecum, small submucosal lipoma at prox transverse colon  . COLONOSCOPY WITH PROPOFOL N/A 08/22/2017   Dr. Gala Romney: two tubular adenomas, sigmoid diverticulosis, colonic lipoma  . ESOPHAGOGASTRODUODENOSCOPY (EGD) WITH PROPOFOL N/A 08/22/2017   Dr. Gala Romney: normal esophagus, normal stomach, normal duodenum  . POLYPECTOMY  08/22/2017   Procedure: POLYPECTOMY;  Surgeon: Daneil Dolin, MD;  [facility];  Service: Endoscopy;;  sigmoid    There were no vitals filed for this visit.  Subjective Assessment - 02/18/18 1436    Subjective  PT states that he has been sore for the past two weeks.  At night it really aches.     Patient Stated Goals  to be able to move his arm without pain, sleep better, lift     Currently in  Pain?  Yes    Pain Score  5     Pain Location  Shoulder    Pain Orientation  Left    Pain Descriptors / Indicators  Aching    Pain Type  Chronic pain    Pain Onset  More than a month ago    Pain Frequency  Constant    Aggravating Factors   night     Pain Relieving Factors  heat                        [facility] Adult PT Treatment/Exercise - 02/18/18 0001      Exercises   Exercises  Shoulder      Shoulder Exercises: Supine   Other Supine Exercises  PROM       Shoulder Exercises: Sidelying   Other Sidelying Exercises  scapular mob       Shoulder Exercises: Standing   External Rotation  Theraband;15 reps    Theraband Level (Shoulder External Rotation)  Level 2 (Red)    Internal Rotation  Left;10 reps;Theraband    Theraband Level (Shoulder Internal Rotation)  Level 2 (Red)    Flexion Limitations  wall arch 10x 5" and UE flexion with back against  the wall 10 reps    ABduction  Both;5 reps   jumping jacks      Shoulder Exercises: Therapy Ball   Flexion  Left;10 reps    ABduction  Left;10 reps    Right/Left  10 reps      Manual Therapy   Manual Therapy  Joint mobilization;Soft tissue mobilization;Passive ROM    Manual therapy comments  Manual therapy completed at end of session    Joint Mobilization  Anterior, posterior and inferior capsule as well as scapula     Soft tissue mobilization  periscapular area    Passive ROM  flex/ext/er/ir              PT Education - 02/18/18 1521    Education Details  new HEP    Person(s) Educated  Patient    Methods  Explanation;Demonstration;Handout    Comprehension  Verbalized understanding;Returned demonstration       PT Short Term Goals - 01/22/18 1604      PT SHORT TERM GOAL #1   Title  Pt pain in his lt shoulder to decrease to no greater than a 6/10 so that pt is only being woke from his sleep 4 or less nights per week     Time  3    Period  Weeks    Status  New    Target Date  02/12/18      PT SHORT TERM  GOAL #2   Title  PT to be able to lift a 5 pound item into a shelf that his shoulder height without increased pain     Time  3    Period  Weeks    Status  New      PT SHORT TERM GOAL #3   Title  Pt to be able to lift a pot of water off the stove using both hands without increased pain.     Time  3    Period  Weeks    Status  New        PT Long Term Goals - 01/22/18 1607      PT LONG TERM GOAL #1   Title  Pt pain in his left shoulder to be no greater than a 2/10 to allow pt to not be waking up during the night due to shoulder pain     Time  6    Period  Weeks    Status  New    Target Date  03/05/18      PT LONG TERM GOAL #2   Title  Pt strength in his left shoulder to have increased to allow pt to lift ten pounds onto a shelf that is shoulder height.    Time  6    Period  Weeks    Status  New      PT LONG TERM GOAL #3   Title  PT to be able to complete yard work/ house painting without experiencing increased pain in his left shoulder.     Time  6    Period  Weeks    Status  New      PT LONG TERM GOAL #4   Title  Pt ROM to have improved to allow pt to reach overhead for tasks such as changing light bulbs without difficulty     Time  6    Period  Weeks    Status  New            Plan - 02/18/18 1523  Clinical Impression Statement  PT states that pain has increase significantly over the weekend.  Pt is unaware of anything he has done that might have irritated the area.  Pt noted mulitple mm spasm in scapular area with scapular mobilization.  Therapist recommended pt to ice everey 2 hrs to both the shoulder and shoulder blade area.     Clinical Presentation  Stable    Rehab Potential  Good    PT Frequency  3x / week    PT Duration  6 weeks    PT Treatment/Interventions  ADLs/Self Care Home Management;Cryotherapy;Ultrasound;Therapeutic activities;Therapeutic exercise;Patient/family education;Passive range of motion;Manual techniques    PT Next Visit Plan  .   Continue gentle PROM to shoulder and arm strengthening to include scapular mobilization.  Add PNF pattern with therahband.     PT Home Exercise Plan  EVAL: wand flexion, abduction, IR/ER; 01/27/18 - supine and side-lying pillow use at night. 8/26: wall arch, UE flexion, doorway stretch; 9/3/:  red t-band for IR/ER, flexion and ER self mobilization exercises.        Patient will benefit from skilled therapeutic intervention in order to improve the following deficits and impairments:  Decreased activity tolerance, Decreased range of motion, Decreased strength, Increased fascial restricitons, Impaired perceived functional ability, Impaired flexibility, Impaired UE functional use, Pain  Visit Diagnosis: Stiffness of left shoulder, not elsewhere classified  Chronic left shoulder pain     Problem List Patient Active Problem List   Diagnosis Date Noted  . Frequent stools 12/11/2017  . Loss of weight 12/11/2017  . Tubular adenoma of colon 09/02/2017  . Meralgia paresthetica of right side 08/26/2017  . Hyperlipidemia LDL goal <100 07/31/2017  . High risk medication use 07/31/2017  . Encounter for hepatitis C screening test for low risk patient 07/31/2017  . Hammer toe of left foot 07/31/2017  . Normocytic anemia 07/22/2017  . Nausea without vomiting 07/22/2017  . Intervertebral disc disorders with myelopathy of lumbar region 02/26/2017  . Spondylolysis of lumbar region 02/26/2017  . Essential hypertension 02/22/2017  . Benign prostatic hyperplasia without lower urinary tract symptoms 02/22/2017    Rayetta Humphrey, PT CLT [phone] 02/18/2018, 3:27 PM  [place], [place], [address] Phone: [phone]   Fax:  [phone]  Name: Charles Ayala MRN: [id number] Date of Birth: [birth year]

## 2018-02-19 ENCOUNTER — Encounter (HOSPITAL_COMMUNITY): Payer: PPO | Admitting: Physical Therapy

## 2018-02-21 ENCOUNTER — Encounter (HOSPITAL_COMMUNITY): Payer: Self-pay | Admitting: Physical Therapy

## 2018-02-21 ENCOUNTER — Ambulatory Visit (HOSPITAL_COMMUNITY): Payer: PPO | Admitting: Physical Therapy

## 2018-02-21 DIAGNOSIS — M25612 Stiffness of left shoulder, not elsewhere classified: Secondary | ICD-10-CM | POA: Diagnosis not present

## 2018-02-21 DIAGNOSIS — M25512 Pain in left shoulder: Secondary | ICD-10-CM

## 2018-02-21 NOTE — Therapy (Signed)
[facility] [address] [place], [place], [address] Phone: [phone]   Fax:  [phone]  Physical Therapy Treatment  Patient Details  Name: Charles Ayala MRN: [id number] Date of Birth: 1950-10-19 Referring Provider: Caren Macadam   Progress Note Reporting Period 01/22/2018 to 02/21/2018  See note below for Objective Data and Assessment of Progress/Goals.      Encounter Date: 02/21/2018  PT End of Session - 02/21/18 1449    Visit Number  10    Number of Visits  18    Date for PT Re-Evaluation  03/05/18    Authorization Type  [employer]    Authorization Time Period  Cert: 8/5--> 6/31/49    Authorization - Visit Number  10    Authorization - Number of Visits  20    PT Start Time  1430    PT Stop Time  1515    PT Time Calculation (min)  45 min    Activity Tolerance  Patient tolerated treatment well;No increased pain    Behavior During Therapy  WFL for tasks assessed/performed       Past Medical History:  Diagnosis Date  . Anemia   . Arthritis   . Heart murmur   . History of stomach ulcers   . Hyperlipidemia   . Hypertension   . Neuropathy     Past Surgical History:  Procedure Laterality Date  . BACK SURGERY  1997   L4 L5  . CARPAL TUNNEL RELEASE Right   . COLONOSCOPY  02/2009   Dr. Laural Golden: few sigmoid diverticula, one at cecum, small submucosal lipoma at prox transverse colon  . COLONOSCOPY WITH PROPOFOL N/A 08/22/2017   Dr. Gala Romney: two tubular adenomas, sigmoid diverticulosis, colonic lipoma  . ESOPHAGOGASTRODUODENOSCOPY (EGD) WITH PROPOFOL N/A 08/22/2017   Dr. Gala Romney: normal esophagus, normal stomach, normal duodenum  . POLYPECTOMY  08/22/2017   Procedure: POLYPECTOMY;  Surgeon: Daneil Dolin, MD;  [facility];  Service: Endoscopy;;  sigmoid    There were no vitals filed for this visit.  Subjective Assessment - 02/21/18 1427    Subjective  PT states that he does not have pain in his arm unless he  is using it.  HE is moving his arm more.     Patient Stated Goals  to be able to move his arm without pain, sleep better, lift     Currently in Pain?  No/denies    Pain Onset  More than a month ago         [facility] PT Assessment - 02/21/18 0001      Assessment   Medical Diagnosis  Lt shoulder pain    Referring Provider  Caren Macadam     Onset Date/Surgical Date  12/22/16    Next MD Visit  not scheduled     Prior Therapy  none      Precautions   Precautions  None      Restrictions   Weight Bearing Restrictions  No      Prior Function   Level of Independence  Independent    Vocation  Retired      Associate Professor   Overall Cognitive Status  Within Functional Limits for tasks assessed      Observation/Other Assessments   Focus on Therapeutic Outcomes (FOTO)   60 was 54       AROM   Left Shoulder Flexion  151 Degrees   120   Left Shoulder ABduction  150 Degrees   was  90   Left Shoulder Internal Rotation  80 Degrees   was 60   Left Shoulder External Rotation  80 Degrees   was 60     Strength   Right Shoulder Flexion  5/5    Right Shoulder Extension  5/5    Right Shoulder ABduction  5/5    Right Shoulder Internal Rotation  5/5    Right Shoulder External Rotation  5/5    Left Shoulder Flexion  4/5   was 4-5    Left Shoulder Extension  5/5    Left Shoulder ABduction  4+/5    Left Shoulder Internal Rotation  5/5    Left Shoulder External Rotation  4/5                   [facility] Adult PT Treatment/Exercise - 02/21/18 0001      Exercises   Exercises  Shoulder      Shoulder Exercises: Supine   Other Supine Exercises  PROM       Shoulder Exercises: Seated   Other Seated Exercises  ER table stretch 15" x 5       Shoulder Exercises: Prone   Extension  Left;10 reps    Extension Weight (lbs)  2    External Rotation  Left;10 reps    Horizontal ABduction 1  Left;5 reps      Shoulder Exercises: Sidelying   Other Sidelying Exercises  scapular mob       Shoulder  Exercises: Standing   External Rotation  Theraband;15 reps    Theraband Level (Shoulder External Rotation)  Level 2 (Red)    Internal Rotation  Left;10 reps;Theraband    Theraband Level (Shoulder Internal Rotation)  Level 2 (Red)    Other Standing Exercises  PNF D1 and D2 with red t-band x 10 each                PT Short Term Goals - 01/22/18 1604      PT SHORT TERM GOAL #1   Title  Pt pain in his lt shoulder to decrease to no greater than a 6/10 so that pt is only being woke from his sleep 4 or less nights per week     Time  3    Period  Weeks    Status  New    Target Date  02/12/18      PT SHORT TERM GOAL #2   Title  PT to be able to lift a 5 pound item into a shelf that his shoulder height without increased pain     Time  3    Period  Weeks    Status  New      PT SHORT TERM GOAL #3   Title  Pt to be able to lift a pot of water off the stove using both hands without increased pain.     Time  3    Period  Weeks    Status  New        PT Long Term Goals - 01/22/18 1607      PT LONG TERM GOAL #1   Title  Pt pain in his left shoulder to be no greater than a 2/10 to allow pt to not be waking up during the night due to shoulder pain     Time  6    Period  Weeks    Status  New    Target Date  03/05/18      PT LONG TERM  GOAL #2   Title  Pt strength in his left shoulder to have increased to allow pt to lift ten pounds onto a shelf that is shoulder height.    Time  6    Period  Weeks    Status  New      PT LONG TERM GOAL #3   Title  PT to be able to complete yard work/ house painting without experiencing increased pain in his left shoulder.     Time  6    Period  Weeks    Status  New      PT LONG TERM GOAL #4   Title  Pt ROM to have improved to allow pt to reach overhead for tasks such as changing light bulbs without difficulty     Time  6    Period  Weeks    Status  New            Plan - 02/21/18 1452    Clinical Impression Statement  Pt reassessed  for 10th visit with significant improvement in ROM.  PT continues to have high pain when reaching to high with functional tasks at home and will continue to benefit from skilled physical therapy.;     Rehab Potential  Good    PT Frequency  3x / week    PT Duration  6 weeks    PT Treatment/Interventions  ADLs/Self Care Home Management;Cryotherapy;Ultrasound;Therapeutic activities;Therapeutic exercise;Patient/family education;Passive range of motion;Manual techniques    PT Next Visit Plan  .  Continue strengthening as well as  gentle PROM to shoulder and arm strengthening to include scapular mobilization.      PT Home Exercise Plan  EVAL: wand flexion, abduction, IR/ER; 01/27/18 - supine and side-lying pillow use at night. 8/26: wall arch, UE flexion, doorway stretch; 9/3/:  red t-band for IR/ER, flexion and ER self mobilization exercises.        Patient will benefit from skilled therapeutic intervention in order to improve the following deficits and impairments:  Decreased activity tolerance, Decreased range of motion, Decreased strength, Increased fascial restricitons, Impaired perceived functional ability, Impaired flexibility, Impaired UE functional use, Pain  Visit Diagnosis: Stiffness of left shoulder, not elsewhere classified  Acute pain of left shoulder     Problem List Patient Active Problem List   Diagnosis Date Noted  . Frequent stools 12/11/2017  . Loss of weight 12/11/2017  . Tubular adenoma of colon 09/02/2017  . Meralgia paresthetica of right side 08/26/2017  . Hyperlipidemia LDL goal <100 07/31/2017  . High risk medication use 07/31/2017  . Encounter for hepatitis C screening test for low risk patient 07/31/2017  . Hammer toe of left foot 07/31/2017  . Normocytic anemia 07/22/2017  . Nausea without vomiting 07/22/2017  . Intervertebral disc disorders with myelopathy of lumbar region 02/26/2017  . Spondylolysis of lumbar region 02/26/2017  . Essential hypertension  02/22/2017  . Benign prostatic hyperplasia without lower urinary tract symptoms 02/22/2017    Rayetta Humphrey, PT CLT [phone] 02/21/2018, 3:17 PM  [facility] [address] [place], [place], [address] Phone: [phone]   Fax:  [phone]  Name: Charles Ayala MRN: [id number] Date of Birth: Nov 22, 1950

## 2018-02-21 NOTE — Patient Instructions (Addendum)
Progressive Resisted: External Rotation (Side-Lying)    Holding __0__ pound weight, towel under arm, raise right forearm toward ceiling. Keep elbow bent and at side. Repeat _10-15___ times per set. Do __1__ sets per session. Do 2____ sessions per day.  http://orth.exer.us/878   Copyright  VHI. All rights reserved.  Progressive Resisted: Extension (Prone)    Holding __2_ pound weights, arms back, raise arms from floor, keeping elbows straight. Repeat 10____ times per set. Do ____ sets per session. Do ____ sessions per day.  http://orth.exer.us/872   Copyright  VHI. All rights reserved.

## 2018-02-24 ENCOUNTER — Ambulatory Visit (HOSPITAL_COMMUNITY): Payer: PPO | Admitting: Physical Therapy

## 2018-02-24 ENCOUNTER — Encounter (HOSPITAL_COMMUNITY): Payer: Self-pay | Admitting: Physical Therapy

## 2018-02-24 ENCOUNTER — Other Ambulatory Visit: Payer: Self-pay

## 2018-02-24 DIAGNOSIS — M25612 Stiffness of left shoulder, not elsewhere classified: Secondary | ICD-10-CM

## 2018-02-24 DIAGNOSIS — G8929 Other chronic pain: Secondary | ICD-10-CM

## 2018-02-24 DIAGNOSIS — M25512 Pain in left shoulder: Secondary | ICD-10-CM

## 2018-02-24 NOTE — Therapy (Signed)
[name], [place], [address] Phone: [phone]   Fax:  [phone]  Physical Therapy Treatment  Patient Details  Name: Charles Ayala MRN: [id number] Date of Birth: 1951/03/09 Referring Provider: Caren Macadam    Encounter Date: 02/24/2018  PT End of Session - 02/24/18 1338    Visit Number  12    Number of Visits  18    Date for PT Re-Evaluation  03/05/18    Authorization Type  [employer]    Authorization Time Period  Cert: 3/2--> 3/55/73    Authorization - Visit Number  12    Authorization - Number of Visits  20    PT Start Time  2202    PT Stop Time  5427    PT Time Calculation (min)  40 min    Activity Tolerance  Patient tolerated treatment well;No increased pain    Behavior During Therapy  WFL for tasks assessed/performed       Past Medical History:  Diagnosis Date  . Anemia   . Arthritis   . Heart murmur   . History of stomach ulcers   . Hyperlipidemia   . Hypertension   . Neuropathy     Past Surgical History:  Procedure Laterality Date  . BACK SURGERY  1997   L4 L5  . CARPAL TUNNEL RELEASE Right   . COLONOSCOPY  02/2009   Dr. Laural Golden: few sigmoid diverticula, one at cecum, small submucosal lipoma at prox transverse colon  . COLONOSCOPY WITH PROPOFOL N/A 08/22/2017   Dr. Gala Romney: two tubular adenomas, sigmoid diverticulosis, colonic lipoma  . ESOPHAGOGASTRODUODENOSCOPY (EGD) WITH PROPOFOL N/A 08/22/2017   Dr. Gala Romney: normal esophagus, normal stomach, normal duodenum  . POLYPECTOMY  08/22/2017   Procedure: POLYPECTOMY;  Surgeon: Daneil Dolin, MD;  [facility];  Service: Endoscopy;;  sigmoid    There were no vitals filed for this visit.  Subjective Assessment - 02/24/18 1253    Subjective  Pt states that the pain is fine when he is at rest but when he goes to reach it still is high.    Patient Stated Goals  to be able to move his arm without pain, sleep better, lift      Currently in Pain?  No/denies    Pain Score  0-No pain    Pain Onset  More than a month ago                       [facility] Adult PT Treatment/Exercise - 02/24/18 0001      Exercises   Exercises  Shoulder      Shoulder Exercises: Supine   Diagonals  Left;5 reps    Other Supine Exercises  PROM     Other Supine Exercises  butterfly x 5      Shoulder Exercises: Prone   Retraction  Strengthening;Right;15 reps    Retraction Weight (lbs)  2    Extension  Left;15 reps    Extension Weight (lbs)  2    External Rotation  Strengthening;Left;15 reps    External Rotation Weight (lbs)  2      Shoulder Exercises: Sidelying   External Rotation  Strengthening;Left;15 reps;Weights    External Rotation Weight (lbs)  2    ABduction  Strengthening;Left;15 reps      Shoulder Exercises: ROM/Strengthening   UBE (Upper Arm Bike)  4' ( backward)      Manual Therapy   Manual Therapy  Joint mobilization    Manual therapy comments  Manual therapy completed at end of session    Joint Mobilization  Anterior, posterior and inferior capsule as well as scapula     Passive ROM  flex/ext/er/ir                PT Short Term Goals - 01/22/18 1604      PT SHORT TERM GOAL #1   Title  Pt pain in his lt shoulder to decrease to no greater than a 6/10 so that pt is only being woke from his sleep 4 or less nights per week     Time  3    Period  Weeks    Status  New    Target Date  02/12/18      PT SHORT TERM GOAL #2   Title  PT to be able to lift a 5 pound item into a shelf that his shoulder height without increased pain     Time  3    Period  Weeks    Status  New      PT SHORT TERM GOAL #3   Title  Pt to be able to lift a pot of water off the stove using both hands without increased pain.     Time  3    Period  Weeks    Status  New        PT Long Term Goals - 01/22/18 1607      PT LONG TERM GOAL #1   Title  Pt pain in his left shoulder to be no greater than a 2/10 to allow pt  to not be waking up during the night due to shoulder pain     Time  6    Period  Weeks    Status  New    Target Date  03/05/18      PT LONG TERM GOAL #2   Title  Pt strength in his left shoulder to have increased to allow pt to lift ten pounds onto a shelf that is shoulder height.    Time  6    Period  Weeks    Status  New      PT LONG TERM GOAL #3   Title  PT to be able to complete yard work/ house painting without experiencing increased pain in his left shoulder.     Time  6    Period  Weeks    Status  New      PT LONG TERM GOAL #4   Title  Pt ROM to have improved to allow pt to reach overhead for tasks such as changing light bulbs without difficulty     Time  6    Period  Weeks    Status  New            Plan - 02/24/18 1339    Clinical Impression Statement  Added SL ER; supine diagonals, UBE and reps to pt program.  Pt continues to slowly gain ROM .    Rehab Potential  Good    PT Frequency  3x / week    PT Duration  6 weeks    PT Treatment/Interventions  ADLs/Self Care Home Management;Cryotherapy;Ultrasound;Therapeutic activities;Therapeutic exercise;Patient/family education;Passive range of motion;Manual techniques    PT Next Visit Plan   Add doorway pectorial stretches.  Continue strengthening as well as  gentle PROM to shoulder and arm strengthening to include scapular mobilization.      PT Home Exercise Plan  EVAL: wand flexion, abduction, IR/ER; 01/27/18 - supine and side-lying pillow use at night. 8/26: wall arch, UE flexion, doorway stretch; 9/3/:  red t-band for IR/ER, flexion and ER self mobilization exercises.        Patient will benefit from skilled therapeutic intervention in order to improve the following deficits and impairments:  Decreased activity tolerance, Decreased range of motion, Decreased strength, Increased fascial restricitons, Impaired perceived functional ability, Impaired flexibility, Impaired UE functional use, Pain  Visit  Diagnosis: Stiffness of left shoulder, not elsewhere classified  Acute pain of left shoulder  Chronic left shoulder pain     Problem List Patient Active Problem List   Diagnosis Date Noted  . Frequent stools 12/11/2017  . Loss of weight 12/11/2017  . Tubular adenoma of colon 09/02/2017  . Meralgia paresthetica of right side 08/26/2017  . Hyperlipidemia LDL goal <100 07/31/2017  . High risk medication use 07/31/2017  . Encounter for hepatitis C screening test for low risk patient 07/31/2017  . Hammer toe of left foot 07/31/2017  . Normocytic anemia 07/22/2017  . Nausea without vomiting 07/22/2017  . Intervertebral disc disorders with myelopathy of lumbar region 02/26/2017  . Spondylolysis of lumbar region 02/26/2017  . Essential hypertension 02/22/2017  . Benign prostatic hyperplasia without lower urinary tract symptoms 02/22/2017    Rayetta Humphrey, PT CLT [phone] 02/24/2018, 1:42 PM  [facility] [address] [place], [place], [address] Phone: [phone]   Fax:  [phone]  Name: Zade Falkner MRN: [id number] Date of Birth: [birth year]

## 2018-02-25 ENCOUNTER — Encounter

## 2018-02-25 ENCOUNTER — Ambulatory Visit: Payer: PPO | Admitting: Internal Medicine

## 2018-02-25 ENCOUNTER — Ambulatory Visit (INDEPENDENT_AMBULATORY_CARE_PROVIDER_SITE_OTHER): Payer: PPO

## 2018-02-25 DIAGNOSIS — Z23 Encounter for immunization: Secondary | ICD-10-CM

## 2018-02-26 ENCOUNTER — Ambulatory Visit (HOSPITAL_COMMUNITY): Payer: PPO | Admitting: Physical Therapy

## 2018-02-26 ENCOUNTER — Other Ambulatory Visit: Payer: Self-pay | Admitting: Family Medicine

## 2018-02-26 ENCOUNTER — Encounter (HOSPITAL_COMMUNITY): Payer: Self-pay | Admitting: Physical Therapy

## 2018-02-26 DIAGNOSIS — M25512 Pain in left shoulder: Secondary | ICD-10-CM

## 2018-02-26 DIAGNOSIS — M25612 Stiffness of left shoulder, not elsewhere classified: Secondary | ICD-10-CM | POA: Diagnosis not present

## 2018-02-26 DIAGNOSIS — I10 Essential (primary) hypertension: Secondary | ICD-10-CM

## 2018-02-26 DIAGNOSIS — R29898 Other symptoms and signs involving the musculoskeletal system: Secondary | ICD-10-CM

## 2018-02-26 NOTE — Therapy (Addendum)
[facility] [place], [place], [address] Phone: [phone]   Fax:  [phone]  Physical Therapy Treatment  Patient Details  Name: Charles Ayala MRN: [id number] Date of Birth: [birth year] Referring Provider: Caren Macadam    Encounter Date: 02/26/2018  PT End of Session - 02/26/18 1346    Visit Number  13    Number of Visits  18    Date for PT Re-Evaluation  03/05/18    Authorization Type  [employer]    Authorization Time Period  Cert: 1/6--> 11/21/28    Authorization - Visit Number  13    Authorization - Number of Visits  20    PT Start Time  1601    PT Stop Time  0932    PT Time Calculation (min)  40 min    Activity Tolerance  Patient tolerated treatment well;No increased pain    Behavior During Therapy  WFL for tasks assessed/performed       Past Medical History:  Diagnosis Date  . Anemia   . Arthritis   . Heart murmur   . History of stomach ulcers   . Hyperlipidemia   . Hypertension   . Neuropathy     Past Surgical History:  Procedure Laterality Date  . BACK SURGERY  1997   L4 L5  . CARPAL TUNNEL RELEASE Right   . COLONOSCOPY  02/2009   Dr. Laural Golden: few sigmoid diverticula, one at cecum, small submucosal lipoma at prox transverse colon  . COLONOSCOPY WITH PROPOFOL N/A 08/22/2017   Dr. Gala Romney: two tubular adenomas, sigmoid diverticulosis, colonic lipoma  . ESOPHAGOGASTRODUODENOSCOPY (EGD) WITH PROPOFOL N/A 08/22/2017   Dr. Gala Romney: normal esophagus, normal stomach, normal duodenum  . POLYPECTOMY  08/22/2017   Procedure: POLYPECTOMY;  Surgeon: Daneil Dolin, MD;  [facility];  Service: Endoscopy;;  sigmoid    There were no vitals filed for this visit.  Subjective Assessment - 02/26/18 1308    Subjective  PT states that his arm was throbbing yesterday and this morning.      Patient Stated Goals  to be able to move his arm without pain, sleep better, lift     Currently in Pain?  Yes    Pain Score  5     Pain Location  Shoulder    Pain Orientation  Left    Pain Descriptors / Indicators  Throbbing    Pain Type  Chronic pain    Pain Onset  More than a month ago    Aggravating Factors   using his arm     Pain Relieving Factors  support     Effect of Pain on Daily Activities  limits                       [facility] Adult PT Treatment/Exercise - 02/26/18 0001      Exercises   Exercises  Shoulder      Shoulder Exercises: Supine   Protraction  AAROM;5 reps;10 reps    Protraction Limitations  pro and retraction with 2 # wand x 10     Internal Rotation Limitations  wand    Flexion  AAROM;Both;10 reps    ABduction  AAROM;Left;10 reps    Diagonals  Left;5 reps    Other Supine Exercises  PROM     Other Supine Exercises  butterfly x 5/Posterior capsule stretch.       Shoulder Exercises: Seated   External Rotation  Both;10  reps    External Rotation Limitations  with IR arms abducted to 90     Flexion  Both;10 reps    Abduction  Both;10 reps      Shoulder Exercises: Sidelying   Flexion  Left;10 reps    ABduction  Strengthening;Left;15 reps    Other Sidelying Exercises  scapular mob       Manual Therapy   Manual Therapy  Joint mobilization    Manual therapy comments  done separate from all other aspects of treatment.     Joint Mobilization  Anterior, posterior and inferior capsule as well as scapula     Passive ROM  flex/ext/er/ir              PT Education - 02/26/18 1349    Education Details  self posterior capulse stretch     Person(s) Educated  Patient    Methods  Explanation    Comprehension  Verbalized understanding       PT Short Term Goals - 01/22/18 1604      PT SHORT TERM GOAL #1   Title  Pt pain in his lt shoulder to decrease to no greater than a 6/10 so that pt is only being woke from his sleep 4 or less nights per week     Time  3    Period  Weeks    Status  New    Target Date  02/12/18      PT SHORT TERM GOAL #2   Title   PT to be able to lift a 5 pound item into a shelf that his shoulder height without increased pain     Time  3    Period  Weeks    Status  New      PT SHORT TERM GOAL #3   Title  Pt to be able to lift a pot of water off the stove using both hands without increased pain.     Time  3    Period  Weeks    Status  New        PT Long Term Goals - 01/22/18 1607      PT LONG TERM GOAL #1   Title  Pt pain in his left shoulder to be no greater than a 2/10 to allow pt to not be waking up during the night due to shoulder pain     Time  6    Period  Weeks    Status  New    Target Date  03/05/18      PT LONG TERM GOAL #2   Title  Pt strength in his left shoulder to have increased to allow pt to lift ten pounds onto a shelf that is shoulder height.    Time  6    Period  Weeks    Status  New      PT LONG TERM GOAL #3   Title  PT to be able to complete yard work/ house painting without experiencing increased pain in his left shoulder.     Time  6    Period  Weeks    Status  New      PT LONG TERM GOAL #4   Title  Pt ROM to have improved to allow pt to reach overhead for tasks such as changing light bulbs without difficulty     Time  6    Period  Weeks    Status  New  Due to ROM in flex /abduction  still being limited reviewed supine wand as well as sidelying exericses.  Jt mobilizations performed at endrange for improved effectiveness.  Pt ROM and pain are both continuing to slowly improve.   Plan - 02/26/18 1351    Rehab Potential  Good    PT Frequency  3x / week    PT Duration  6 weeks    PT Treatment/Interventions  ADLs/Self Care Home Management;Cryotherapy;Ultrasound;Therapeutic activities;Therapeutic exercise;Patient/family education;Passive range of motion;Manual techniques    PT Next Visit Plan   Add doorway pectorial stretches.  Continue strengthening as well as  gentle PROM to shoulder and arm strengthening to include scapular mobilization.      PT Home Exercise  Plan  EVAL: wand flexion, abduction, IR/ER; 01/27/18 - supine and side-lying pillow use at night. 8/26: wall arch, UE flexion, doorway stretch; 9/3/:  red t-band for IR/ER, flexion and ER self mobilization exercises.     Consulted and Agree with Plan of Care  Patient       Patient will benefit from skilled therapeutic intervention in order to improve the following deficits and impairments:  Decreased activity tolerance, Decreased range of motion, Decreased strength, Increased fascial restricitons, Impaired perceived functional ability, Impaired flexibility, Impaired UE functional use, Pain  Visit Diagnosis: Stiffness of left shoulder, not elsewhere classified  Acute pain of left shoulder  Other symptoms and signs involving the musculoskeletal system     Problem List Patient Active Problem List   Diagnosis Date Noted  . Frequent stools 12/11/2017  . Loss of weight 12/11/2017  . Tubular adenoma of colon 09/02/2017  . Meralgia paresthetica of right side 08/26/2017  . Hyperlipidemia LDL goal <100 07/31/2017  . High risk medication use 07/31/2017  . Encounter for hepatitis C screening test for low risk patient 07/31/2017  . Hammer toe of left foot 07/31/2017  . Normocytic anemia 07/22/2017  . Nausea without vomiting 07/22/2017  . Intervertebral disc disorders with myelopathy of lumbar region 02/26/2017  . Spondylolysis of lumbar region 02/26/2017  . Essential hypertension 02/22/2017  . Benign prostatic hyperplasia without lower urinary tract symptoms 02/22/2017   Rayetta Humphrey, PT CLT [phone] 02/26/2018, 1:52 PM  [facility] [address] [place], [place], [address] Phone: [phone]   Fax:  [phone]  Name: Charles Ayala MRN: [id number] Date of Birth: 02/17/1951

## 2018-02-27 ENCOUNTER — Telehealth: Payer: Self-pay | Admitting: Orthopaedic Surgery

## 2018-02-27 NOTE — Telephone Encounter (Signed)
Patient called back and states it is his left shoulder not the right and he is going to PT starting tomorrow. Stated he told Dr. Mannie Stabile he wanted to try PT first and then if that doesn't work, he would want to be referred. He is going to contact Dr. Roma Kayser office to let them know they have the wrong shoulder listed for the referral and that he starts PT tomorrow."  This was a message taken by Derek Mound about a month ago and placed in the [facility].  We are closing this referral due to the fact that he wanted to have PT first and also the referral was for the wrong shoulder

## 2018-02-28 ENCOUNTER — Ambulatory Visit (HOSPITAL_COMMUNITY): Payer: PPO

## 2018-02-28 DIAGNOSIS — G8929 Other chronic pain: Secondary | ICD-10-CM

## 2018-02-28 DIAGNOSIS — M25612 Stiffness of left shoulder, not elsewhere classified: Secondary | ICD-10-CM | POA: Diagnosis not present

## 2018-02-28 DIAGNOSIS — M25512 Pain in left shoulder: Secondary | ICD-10-CM

## 2018-02-28 DIAGNOSIS — R29898 Other symptoms and signs involving the musculoskeletal system: Secondary | ICD-10-CM

## 2018-02-28 NOTE — Addendum Note (Signed)
Addended by: Geralyn Corwin on: 02/28/2018 02:17 PM   Modules accepted: Orders

## 2018-02-28 NOTE — Therapy (Addendum)
[facility] [place], [place], [address] Phone: [phone]   Fax:  [phone]  Physical Therapy Treatment  Patient Details  Name: Charles Ayala MRN: [id number] Date of Birth: [birth year] Referring Provider: Caren Macadam    Encounter Date: 02/28/2018  PT End of Session - 02/28/18 1257    Visit Number  14    Number of Visits  18    Date for PT Re-Evaluation  03/05/18    Authorization Type  [employer]    Authorization Time Period  Cert: 1/6--> 0/10/93    Authorization - Visit Number  14    Authorization - Number of Visits  20    PT Start Time  2355    Activity Tolerance  Patient tolerated treatment well;No increased pain    Behavior During Therapy  WFL for tasks assessed/performed       Past Medical History:  Diagnosis Date  . Anemia   . Arthritis   . Heart murmur   . History of stomach ulcers   . Hyperlipidemia   . Hypertension   . Neuropathy     Past Surgical History:  Procedure Laterality Date  . BACK SURGERY  1997   L4 L5  . CARPAL TUNNEL RELEASE Right   . COLONOSCOPY  02/2009   Dr. Laural Golden: few sigmoid diverticula, one at cecum, small submucosal lipoma at prox transverse colon  . COLONOSCOPY WITH PROPOFOL N/A 08/22/2017   Dr. Gala Romney: two tubular adenomas, sigmoid diverticulosis, colonic lipoma  . ESOPHAGOGASTRODUODENOSCOPY (EGD) WITH PROPOFOL N/A 08/22/2017   Dr. Gala Romney: normal esophagus, normal stomach, normal duodenum  . POLYPECTOMY  08/22/2017   Procedure: POLYPECTOMY;  Surgeon: Daneil Dolin, MD;  [facility];  Service: Endoscopy;;  sigmoid    There were no vitals filed for this visit.  Subjective Assessment - 02/28/18 1259    Subjective  Pt states that his shoulder is feeling pretty good right now. No pain to speak of at the moment, but he's not sure how it will feel after therapy.    Patient Stated Goals  to be able to move his arm without pain, sleep better, lift     Currently  in Pain?  No/denies    Pain Onset  More than a month ago         [facility] PT Assessment - 02/28/18 0001      AROM   Left Shoulder Internal Rotation  28 Degrees   in prone against gravity   Left Shoulder External Rotation  60 Degrees   in prone against gravity          [facility] Adult PT Treatment/Exercise - 02/28/18 0001      Exercises   Exercises  Shoulder      Shoulder Exercises: Sidelying   Flexion  Left;15 reps;AROM    ABduction  Left;15 reps;AROM      Shoulder Exercises: ROM/Strengthening   UBE (Upper Arm Bike)  4', L1, backward      Shoulder Exercises: Stretch   Internal Rotation Stretch  10 seconds    Internal Rotation Stretch Limitations  standing with towel    External Rotation Stretch  5 reps;10 seconds;Limitations   in doorway   Other Shoulder Stretches  pec stretch in doorway 3x30"      Manual Therapy   Manual Therapy  Myofascial release;Passive ROM;Manual Traction    Manual therapy comments  done separate from all other aspects of treatment.     Joint Mobilization  central  PAs T1-T12 x2RT to improve mobility    Myofascial Release  L infraspinatus, teres minor to address taut bands/trigger points in order to decrease pain    Passive ROM  L GH IR to improve ROM    Manual Traction  L GH distraction 5x10" bouts to decrease pain           PT Education - 02/28/18 1258    Education Details  exercise technique, continue HEP    Person(s) Educated  Patient    Methods  Explanation;Demonstration    Comprehension  Verbalized understanding;Returned demonstration       PT Short Term Goals - 01/22/18 1604      PT SHORT TERM GOAL #1   Title  Pt pain in his lt shoulder to decrease to no greater than a 6/10 so that pt is only being woke from his sleep 4 or less nights per week     Time  3    Period  Weeks    Status  New    Target Date  02/12/18      PT SHORT TERM GOAL #2   Title  PT to be able to lift a 5 pound item into a shelf that his shoulder height  without increased pain     Time  3    Period  Weeks    Status  New      PT SHORT TERM GOAL #3   Title  Pt to be able to lift a pot of water off the stove using both hands without increased pain.     Time  3    Period  Weeks    Status  New        PT Long Term Goals - 01/22/18 1607      PT LONG TERM GOAL #1   Title  Pt pain in his left shoulder to be no greater than a 2/10 to allow pt to not be waking up during the night due to shoulder pain     Time  6    Period  Weeks    Status  New    Target Date  03/05/18      PT LONG TERM GOAL #2   Title  Pt strength in his left shoulder to have increased to allow pt to lift ten pounds onto a shelf that is shoulder height.    Time  6    Period  Weeks    Status  New      PT LONG TERM GOAL #3   Title  PT to be able to complete yard work/ house painting without experiencing increased pain in his left shoulder.     Time  6    Period  Weeks    Status  New      PT LONG TERM GOAL #4   Title  Pt ROM to have improved to allow pt to reach overhead for tasks such as changing light bulbs without difficulty     Time  6    Period  Weeks    Status  New            Plan - 02/28/18 1358    Clinical Impression Statement  Continued with established POC focusing on L GH AROM and mobility deficits. He continues to have pain at end range OH flexion and pinpointed it at his infraspinatus/teres minor; PT applied pressure to this area during OH flexion and he reported decreased pain. This PT measured his AROM [facility] ER  and IR against gravity in prone and he was noted to have 60deg ER and only 28deg IR. He reported pain at his infraspinatus/teres minor with IR. Pt with palpable knot and taut band in infraspinatus/teres minor so PT performed MFR to this area and he reported decreased pain with L GH movement following. He was also noted to have deficits in thoracic spine mobility. This PT recommends initiating thoracic spine mobility and potentially dry needling  his L infraspinatus/teres minor to address soft tissue restrictions and maximize OH ROM (PT updated cert to include dry needling in POC). Pt stating he had decreased pain with OH flexion at EOS.     Rehab Potential  Good    PT Frequency  3x / week    PT Duration  6 weeks    PT Treatment/Interventions  ADLs/Self Care Home Management;Cryotherapy;Ultrasound;Therapeutic activities;Therapeutic exercise;Patient/family education;Passive range of motion;Manual techniques;Dry needling    PT Next Visit Plan  discuss and potentially dry needle L infraspinatus/teres minor; begin thoracic mobility work; Add doorway pectorial stretches as therapist ran out of time to complete.  Continue strengthening as well as  gentle PROM to shoulder and arm strengthening to include scapular mobilization.      PT Home Exercise Plan  EVAL: wand flexion, abduction, IR/ER; 01/27/18 - supine and side-lying pillow use at night. 8/26: wall arch, UE flexion, doorway stretch; 9/3/:  red t-band for IR/ER, flexion and ER self mobilization exercises.     Consulted and Agree with Plan of Care  Patient       Patient will benefit from skilled therapeutic intervention in order to improve the following deficits and impairments:  Decreased activity tolerance, Decreased range of motion, Decreased strength, Increased fascial restricitons, Impaired perceived functional ability, Impaired flexibility, Impaired UE functional use, Pain  Visit Diagnosis: Stiffness of left shoulder, not elsewhere classified - Plan: PT plan of care cert/re-cert  Acute pain of left shoulder - Plan: PT plan of care cert/re-cert  Other symptoms and signs involving the musculoskeletal system - Plan: PT plan of care cert/re-cert  Chronic left shoulder pain - Plan: PT plan of care cert/re-cert     Problem List Patient Active Problem List   Diagnosis Date Noted  . Frequent stools 12/11/2017  . Loss of weight 12/11/2017  . Tubular adenoma of colon 09/02/2017  .  Meralgia paresthetica of right side 08/26/2017  . Hyperlipidemia LDL goal <100 07/31/2017  . High risk medication use 07/31/2017  . Encounter for hepatitis C screening test for low risk patient 07/31/2017  . Hammer toe of left foot 07/31/2017  . Normocytic anemia 07/22/2017  . Nausea without vomiting 07/22/2017  . Intervertebral disc disorders with myelopathy of lumbar region 02/26/2017  . Spondylolysis of lumbar region 02/26/2017  . Essential hypertension 02/22/2017  . Benign prostatic hyperplasia without lower urinary tract symptoms 02/22/2017       Geraldine Solar PT, DPT  [facility] [address] [place], [place], [address] Phone: [phone]   Fax:  [phone]  Name: Charles Ayala MRN: [id number] Date of Birth: 24-Jun-1950

## 2018-03-03 ENCOUNTER — Ambulatory Visit (HOSPITAL_COMMUNITY): Payer: PPO | Admitting: Physical Therapy

## 2018-03-03 DIAGNOSIS — M25612 Stiffness of left shoulder, not elsewhere classified: Secondary | ICD-10-CM

## 2018-03-03 DIAGNOSIS — M25512 Pain in left shoulder: Secondary | ICD-10-CM

## 2018-03-03 DIAGNOSIS — R29898 Other symptoms and signs involving the musculoskeletal system: Secondary | ICD-10-CM

## 2018-03-03 NOTE — Patient Instructions (Addendum)
Scapula Adduction With Pectorals, High    Stand in doorframe with palms against frame and arms at 120. Lean forward and squeeze shoulder blades. Hold _30__ seconds. Repeat _3__ times per session. Do _0__ sessions per day.  Copyright  V[employer]. All rights reserved.  Scapula Adduction With Pectorals, Low    Stand in doorframe with palms against frame and arms at 45. Lean forward and squeeze shoulder blades. Hold _30__ seconds. Repeat _3__ times per session. Do 3___ sessions per day.  Copyright  VHI. All rights reserved.  Scapula Adduction With Pectorals, Mid-Range    Stand in doorframe with palms against frame and arms at 90. Lean forward and squeeze shoulder blades. Hold _30__ seconds. Repeat _3__ times per session. Do __3 sessions per day.  Copyright  VHI. All rights reserved.

## 2018-03-03 NOTE — Therapy (Signed)
[place], [place], [address] Phone: [phone]   Fax:  [phone]  Physical Therapy Treatment  Patient Details  Name: Charles Ayala MRN: [id number] Date of Birth: 12-13-1950 Referring Provider: Caren Macadam    Encounter Date: 03/03/2018  PT End of Session - 03/03/18 1445    Visit Number  16    Number of Visits  18    Date for PT Re-Evaluation  03/05/18    Authorization Type  [employer]    Authorization Time Period  Cert: 9/6--> 2/83/66    Authorization - Visit Number  16    Authorization - Number of Visits  20    PT Start Time  2947    PT Stop Time  1515    PT Time Calculation (min)  40 min    Activity Tolerance  Patient tolerated treatment well;No increased pain    Behavior During Therapy  WFL for tasks assessed/performed       Past Medical History:  Diagnosis Date  . Anemia   . Arthritis   . Heart murmur   . History of stomach ulcers   . Hyperlipidemia   . Hypertension   . Neuropathy     Past Surgical History:  Procedure Laterality Date  . BACK SURGERY  1997   L4 L5  . CARPAL TUNNEL RELEASE Right   . COLONOSCOPY  02/2009   Dr. Laural Golden: few sigmoid diverticula, one at cecum, small submucosal lipoma at prox transverse colon  . COLONOSCOPY WITH PROPOFOL N/A 08/22/2017   Dr. Gala Romney: two tubular adenomas, sigmoid diverticulosis, colonic lipoma  . ESOPHAGOGASTRODUODENOSCOPY (EGD) WITH PROPOFOL N/A 08/22/2017   Dr. Gala Romney: normal esophagus, normal stomach, normal duodenum  . POLYPECTOMY  08/22/2017   Procedure: POLYPECTOMY;  Surgeon: Daneil Dolin, MD;  [facility];  Service: Endoscopy;;  sigmoid    There were no vitals filed for this visit.  Subjective Assessment - 03/03/18 1429    Subjective  PT states that his arm is doing a lot better.  He only has pain when he is raising his arm but this is even down some.     Patient Stated Goals  to be able to move his arm without pain,  sleep better, lift     Currently in Pain?  No/denies   will go as high as a 3   Pain Onset  More than a month ago         [facility] PT Assessment - 03/03/18 0001      Assessment   Medical Diagnosis  Lt shoulder pain    Onset Date/Surgical Date  12/22/16    Next MD Visit  not scheduled     Prior Therapy  none      Precautions   Precautions  None      Restrictions   Weight Bearing Restrictions  No      Prior Function   Level of Independence  Independent    Vocation  Retired      Associate Professor   Overall Cognitive Status  Within Functional Limits for tasks assessed      Observation/Other Assessments   Focus on Therapeutic Outcomes (FOTO)   --      AROM   Right/Left Shoulder  Right    Right Shoulder Flexion  160 Degrees    Right Shoulder ABduction  180 Degrees    Left Shoulder Flexion  151 Degrees    was initially 120 but not improvement since 9/6  Left Shoulder ABduction  150 Degrees   was 90 upon initial evaluation but no improvement since 9/6    Left Shoulder Internal Rotation  80 Degrees   was 60 tested supine initially now 80    Left Shoulder External Rotation  80 Degrees   was 60     Strength   Right Shoulder Flexion  5/5    Right Shoulder Extension  5/5    Right Shoulder ABduction  5/5    Right Shoulder Internal Rotation  5/5    Right Shoulder External Rotation  5/5    Left Shoulder Flexion  5/5   was 4-5 at eval 4/5 on 9/6    Left Shoulder Extension  5/5    Left Shoulder ABduction  5/5   was 4+ on 9/6    Left Shoulder Internal Rotation  5/5    Left Shoulder External Rotation  4+/5   was 4/5                   [facility] Adult PT Treatment/Exercise - 03/03/18 0001      Exercises   Exercises  Shoulder      Shoulder Exercises: Seated   Other Seated Exercises  thoracic excursions x 3       Shoulder Exercises: Prone   Retraction  Strengthening;Left;10 reps    Retraction Weight (lbs)  3    Extension  Left;15 reps    Extension Weight (lbs)  3     External Rotation  Strengthening;Left;15 reps    External Rotation Weight (lbs)  2    Horizontal ABduction 1 Weight (lbs)  x10      Shoulder Exercises: Standing   Flexion  Both;10 reps    ABduction  Left;5 reps      Shoulder Exercises: ROM/Strengthening   UBE (Upper Arm Bike)  2' forward/2' backward.      Shoulder Exercises: Stretch   Other Shoulder Stretches  pec stretch in doorway 3x30"      Manual Therapy   Manual Therapy  Myofascial release;Passive ROM;Manual Traction    Manual therapy comments  done separate from all other aspects of treatment.     Myofascial Release  L infraspinatus, teres minor to address taut bands/trigger points in order to decrease pain               PT Short Term Goals - 03/03/18 1521      PT SHORT TERM GOAL #1   Title  Pt pain in his lt shoulder to decrease to no greater than a 6/10 so that pt is only being woke from his sleep 4 or less nights per week     Time  3    Period  Weeks    Status  Achieved      PT SHORT TERM GOAL #2   Title  PT to be able to lift a 5 pound item into a shelf that his shoulder height without increased pain     Time  3    Period  Weeks    Status  Partially Met      PT SHORT TERM GOAL #3   Title  Pt to be able to lift a pot of water off the stove using both hands without increased pain.     Time  3    Period  Weeks    Status  Partially Met        PT Long Term Goals - 03/03/18 1522      PT LONG  TERM GOAL #1   Title  Pt pain in his left shoulder to be no greater than a 2/10 to allow pt to not be waking up during the night due to shoulder pain     Time  6    Period  Weeks    Status  On-going      PT LONG TERM GOAL #2   Title  Pt strength in his left shoulder to have increased to allow pt to lift ten pounds onto a shelf that is shoulder height.    Time  6    Period  Weeks    Status  On-going      PT LONG TERM GOAL #3   Title  PT to be able to complete yard work/ house painting without experiencing  increased pain in his left shoulder.     Time  6    Period  Weeks    Status  On-going      PT LONG TERM GOAL #4   Title  Pt ROM to have improved to allow pt to reach overhead for tasks such as changing light bulbs without difficulty     Time  6    Period  Weeks    Status  On-going            Plan - 03/03/18 1500    Clinical Impression Statement  Added thoracic excursion and pectoral stretchs to HEP .  PT IR/ER remain normal when tested supine.  PT has not gained in flexion or abduction since 02/21/2018.  PT strength is now normal.  PT agreeable to attempt dry needling next visit to see if that will help relieve his spasm and improve mobility.      Rehab Potential  Good    PT Frequency  3x / week    PT Duration  6 weeks    PT Treatment/Interventions  ADLs/Self Care Home Management;Cryotherapy;Ultrasound;Therapeutic activities;Therapeutic exercise;Patient/family education;Passive range of motion;Manual techniques;Dry needling    PT Next Visit Plan  attempt dry needle L infraspinatus/teres minor;   s  gentle PROM to shoulder and  scapular mobilization.      PT Home Exercise Plan  EVAL: wand flexion, abduction, IR/ER; 01/27/18 - supine and side-lying pillow use at night. 8/26: wall arch, UE flexion, doorway stretch; 9/3/:  red t-band for IR/ER, flexion and ER self mobilization exercises.     Consulted and Agree with Plan of Care  Patient       Patient will benefit from skilled therapeutic intervention in order to improve the following deficits and impairments:  Decreased activity tolerance, Decreased range of motion, Decreased strength, Increased fascial restricitons, Impaired perceived functional ability, Impaired flexibility, Impaired UE functional use, Pain  Visit Diagnosis: Stiffness of left shoulder, not elsewhere classified  Acute pain of left shoulder  Other symptoms and signs involving the musculoskeletal system     Problem List Patient Active Problem List   Diagnosis  Date Noted  . Frequent stools 12/11/2017  . Loss of weight 12/11/2017  . Tubular adenoma of colon 09/02/2017  . Meralgia paresthetica of right side 08/26/2017  . Hyperlipidemia LDL goal <100 07/31/2017  . High risk medication use 07/31/2017  . Encounter for hepatitis C screening test for low risk patient 07/31/2017  . Hammer toe of left foot 07/31/2017  . Normocytic anemia 07/22/2017  . Nausea without vomiting 07/22/2017  . Intervertebral disc disorders with myelopathy of lumbar region 02/26/2017  . Spondylolysis of lumbar region 02/26/2017  . Essential hypertension 02/22/2017  .  Benign prostatic hyperplasia without lower urinary tract symptoms 02/22/2017    Rayetta Humphrey, PT CLT [id number] 03/03/2018, 3:22 PM  [place], [place], [address] Phone: [phone]   Fax:  [phone]  Name: Charles Ayala MRN: [id number] Date of Birth: [birth year]

## 2018-03-04 ENCOUNTER — Ambulatory Visit: Payer: PPO | Admitting: Cardiovascular Disease

## 2018-03-04 ENCOUNTER — Encounter: Payer: Self-pay | Admitting: Cardiovascular Disease

## 2018-03-04 VITALS — BP 151/69 | HR 56 | Ht 72.0 in | Wt 181.8 lb

## 2018-03-04 DIAGNOSIS — I739 Peripheral vascular disease, unspecified: Secondary | ICD-10-CM

## 2018-03-04 DIAGNOSIS — I350 Nonrheumatic aortic (valve) stenosis: Secondary | ICD-10-CM

## 2018-03-04 DIAGNOSIS — E785 Hyperlipidemia, unspecified: Secondary | ICD-10-CM

## 2018-03-04 DIAGNOSIS — R0989 Other specified symptoms and signs involving the circulatory and respiratory systems: Secondary | ICD-10-CM

## 2018-03-04 DIAGNOSIS — I10 Essential (primary) hypertension: Secondary | ICD-10-CM | POA: Diagnosis not present

## 2018-03-04 NOTE — Patient Instructions (Signed)
Medication Instructions: Your physician recommends that you continue on your current medications as directed. Please refer to the Current Medication list given to you today.  If you need a refill on your cardiac medications before your next appointment, please call your pharmacy.   Procedures/Testing: Your physician has requested that you have a carotid duplex. This test is an ultrasound of the carotid arteries in your neck. It looks at blood flow through these arteries that supply the brain with blood. Allow one hour for this exam. There are no restrictions or special instructions.   Follow-Up: Your physician wants you to follow-up in 12 months with Dr. Fletcher Anon. You will receive a reminder letter in the mail two months in advance. If you don't receive a letter, please call our office at [phone] to schedule this follow-up appointment.  Thank you for choosing [facility] at [facility]!!

## 2018-03-04 NOTE — Progress Notes (Signed)
Cardiology Office Note   Date:  03/04/2018   ID:  Charles Ayala, DOB 1951-03-11, MRN [id number]  PCP:  Caren Macadam, MD  Cardiologist: Dr. Domenic Polite  No chief complaint on file.     History of Present Illness: Charles Ayala is a 67 y.o. male who is here today for follow-up visit regarding peripheral arterial disease.  He has known history of mild aortic stenosis, moderate mitral regurgitation,essential hypertension, hyperlipidemia and chronic back pain.  He is not diabetic and does not smoke.  Family history is negative for coronary artery disease.   He was seen for mild right calf claudication.  Vascular testing in March showed moderately reduced ABI on the right and 0.5 range with evidence of severe right SFA stenosis.  No significant disease on the left side. The patient was treated medically given that his symptoms were mild.  He underwent foot surgery on the left side which limited his ability to walk for a few months but he is now back to baseline.  He reports that the right calf claudication is still mild and not lifestyle limiting.  No chest pain or shortness of breath   Past Medical History:  Diagnosis Date  . Anemia   . Arthritis   . Heart murmur   . History of stomach ulcers   . Hyperlipidemia   . Hypertension   . Neuropathy     Past Surgical History:  Procedure Laterality Date  . BACK SURGERY  1997   L4 L5  . CARPAL TUNNEL RELEASE Right   . COLONOSCOPY  02/2009   Dr. Laural Golden: few sigmoid diverticula, one at cecum, small submucosal lipoma at prox transverse colon  . COLONOSCOPY WITH PROPOFOL N/A 08/22/2017   Dr. Gala Romney: two tubular adenomas, sigmoid diverticulosis, colonic lipoma  . ESOPHAGOGASTRODUODENOSCOPY (EGD) WITH PROPOFOL N/A 08/22/2017   Dr. Gala Romney: normal esophagus, normal stomach, normal duodenum  . POLYPECTOMY  08/22/2017   Procedure: POLYPECTOMY;  Surgeon: Daneil Dolin, MD;  [facility];  Service: Endoscopy;;  sigmoid     Current  Outpatient Medications  Medication Sig Dispense Refill  . aspirin EC 81 MG tablet Take 1 tablet (81 mg total) by mouth daily. 90 tablet 3  . gabapentin (NEURONTIN) 300 MG capsule Take 2 pills in the am, 1 pill in the afternoon, and 2 pills in the evening 150 capsule 0  . hydrochlorothiazide (HYDRODIURIL) 25 MG tablet TAKE 1 TABLET BY MOUTH ONCE DAILY 90 tablet 0  . HYDROcodone-acetaminophen (NORCO) 7.5-325 MG tablet Take 1-2 tablets by mouth every 6 (six) hours as needed.    . simvastatin (ZOCOR) 20 MG tablet Take 1 tablet (20 mg total) by mouth at bedtime. 90 tablet 3  . tamsulosin (FLOMAX) 0.4 MG CAPS capsule Take 1 capsule (0.4 mg total) daily by mouth. 90 capsule 3  . traMADol (ULTRAM) 50 MG tablet Take 50-100 mg by mouth every 8 (eight) hours as needed (for pain.).      No current facility-administered medications for this visit.     Allergies:   Patient has no known allergies.    Social History:  The patient  reports that he has never smoked. He has never used smokeless tobacco. He reports that he does not drink alcohol or use drugs.   Family History:  The patient's family history includes Cancer in his sister; Diabetes in his son; Hypertension in his father and mother; Parkinson's disease in his mother.    ROS:  Please see the history of present  illness.   Otherwise, review of systems are positive for none.   All other systems are reviewed and negative.    PHYSICAL EXAM: VS:  BP (!) 151/69   Pulse (!) 56   Ht 6' (1.829 m)   Wt 181 lb 12.8 oz (82.5 kg)   BMI 24.66 kg/m  , BMI Body mass index is 24.66 kg/m. GEN: Well nourished, well developed, in no acute distress  HEENT: normal  Neck: no JVD, or masses.  Left carotid bruit Cardiac: RRR; no  rubs, or gallops,no edema .  2 out of 6 crescendo decrescendo systolic murmur in the aortic area which is early peaking. Respiratory:  clear to auscultation bilaterally, normal work of breathing GI: soft, nontender, nondistended, +  BS MS: no deformity or atrophy  Skin: warm and dry, no rash Neuro:  Strength and sensation are intact Psych: euthymic mood, full affect Vascular: Femoral pulses normal bilaterally.  Distal pulses are normal in the left side and not palpable on the right side   EKG:  EKG is ordered today. EKG showed sinus bradycardia with PACs   Recent Labs: 07/25/2017: TSH 1.50 01/03/2018: ALT 8; BUN 17; Creat 0.85; Hemoglobin 11.9; Platelets 217; Potassium 4.3; Sodium 140    Lipid Panel    Component Value Date/Time   CHOL 185 01/03/2018 0917   TRIG 47 01/03/2018 0917   HDL 59 01/03/2018 0917   CHOLHDL 3.1 01/03/2018 0917   LDLCALC 112 (H) 01/03/2018 0917      Wt Readings from Last 3 Encounters:  03/04/18 181 lb 12.8 oz (82.5 kg)  01/14/18 178 lb 1.6 oz (80.8 kg)  01/08/18 175 lb 1.9 oz (79.4 kg)       PAD Screen 05/07/2017  Previous PAD dx? No  Previous surgical procedure? No  Pain with walking? No  Feet/toe relief with dangling? No  Painful, non-healing ulcers? No  Extremities discolored? No      ASSESSMENT AND PLAN:  1.  Peripheral arterial disease: Significant right SFA stenosis with moderately reduced ABI.  The patient has mild non-lifestyle limiting right calf claudication.  His symptoms has not worsened over the last 6 months.  I recommend continuing medical therapy.  I encouraged him to continue with the walking program.  2. Mild aortic stenosis and moderate mitral regurgitation: Followed by Dr. Domenic Polite.  3.  Essential hypertension: Blood pressure is elevated today but he reports controlled readings at home.  4.  Hyperlipidemia: The patient has a history of intolerance to statins but he has been tolerating simvastatin with improvement in LDL.  5.  Left carotid bruit: I requested carotid Doppler.   Disposition:   FU with me in 12 months  Signed, foot  Kathlyn Sacramento, MD  03/04/2018 10:03 AM    [place]

## 2018-03-05 ENCOUNTER — Encounter: Payer: Self-pay | Admitting: Family Medicine

## 2018-03-05 ENCOUNTER — Ambulatory Visit (HOSPITAL_COMMUNITY): Payer: PPO

## 2018-03-05 ENCOUNTER — Other Ambulatory Visit: Payer: Self-pay

## 2018-03-05 ENCOUNTER — Ambulatory Visit (INDEPENDENT_AMBULATORY_CARE_PROVIDER_SITE_OTHER): Payer: PPO | Admitting: Family Medicine

## 2018-03-05 ENCOUNTER — Encounter (HOSPITAL_COMMUNITY): Payer: Self-pay

## 2018-03-05 VITALS — BP 126/70 | HR 54 | Temp 98.4°F | Resp 12 | Ht 72.0 in | Wt 181.0 lb

## 2018-03-05 DIAGNOSIS — Z23 Encounter for immunization: Secondary | ICD-10-CM | POA: Diagnosis not present

## 2018-03-05 DIAGNOSIS — R011 Cardiac murmur, unspecified: Secondary | ICD-10-CM | POA: Diagnosis not present

## 2018-03-05 DIAGNOSIS — E785 Hyperlipidemia, unspecified: Secondary | ICD-10-CM

## 2018-03-05 DIAGNOSIS — E7800 Pure hypercholesterolemia, unspecified: Secondary | ICD-10-CM | POA: Diagnosis not present

## 2018-03-05 DIAGNOSIS — G629 Polyneuropathy, unspecified: Secondary | ICD-10-CM | POA: Diagnosis not present

## 2018-03-05 DIAGNOSIS — I10 Essential (primary) hypertension: Secondary | ICD-10-CM | POA: Diagnosis not present

## 2018-03-05 DIAGNOSIS — G8929 Other chronic pain: Secondary | ICD-10-CM

## 2018-03-05 DIAGNOSIS — E782 Mixed hyperlipidemia: Secondary | ICD-10-CM

## 2018-03-05 DIAGNOSIS — M25612 Stiffness of left shoulder, not elsewhere classified: Secondary | ICD-10-CM

## 2018-03-05 DIAGNOSIS — R29898 Other symptoms and signs involving the musculoskeletal system: Secondary | ICD-10-CM

## 2018-03-05 DIAGNOSIS — M25512 Pain in left shoulder: Secondary | ICD-10-CM

## 2018-03-05 MED ORDER — SIMVASTATIN 20 MG PO TABS: 20.0000 mg | ORAL_TABLET | Freq: Every day | ORAL | 1 refills | Status: DC

## 2018-03-05 MED ORDER — GABAPENTIN 300 MG PO CAPS: ORAL_CAPSULE | ORAL | 1 refills | Status: DC

## 2018-03-05 MED ORDER — HYDROCHLOROTHIAZIDE 25 MG PO TABS: 25.0000 mg | ORAL_TABLET | Freq: Every day | ORAL | 1 refills | Status: AC

## 2018-03-05 NOTE — Therapy (Signed)
[place], [place], [address] Phone: [phone]   Fax:  [phone]  Physical Therapy Treatment  Patient Details  Name: Charles Ayala MRN: [id number] Date of Birth: September 28, 1950 Referring Provider: Caren Macadam    Encounter Date: 03/05/2018  PT End of Session - 03/05/18 1515    Visit Number  17    Number of Visits  18    Date for PT Re-Evaluation  03/07/18    Authorization Type  [employer]    Authorization Time Period  Cert: 7/8--> 4/78/41; 03/05/18 to 03/07/18    Authorization - Visit Number  17    Authorization - Number of Visits  20    PT Start Time  2820    PT Stop Time  1556    PT Time Calculation (min)  40 min    Activity Tolerance  Patient tolerated treatment well;No increased pain    Behavior During Therapy  WFL for tasks assessed/performed       Past Medical History:  Diagnosis Date  . Anemia   . Arthritis   . Heart murmur   . History of stomach ulcers   . Hyperlipidemia   . Hypertension   . Neuropathy     Past Surgical History:  Procedure Laterality Date  . BACK SURGERY  1997   L4 L5  . CARPAL TUNNEL RELEASE Right   . COLONOSCOPY  02/2009   Dr. Laural Golden: few sigmoid diverticula, one at cecum, small submucosal lipoma at prox transverse colon  . COLONOSCOPY WITH PROPOFOL N/A 08/22/2017   Dr. Gala Romney: two tubular adenomas, sigmoid diverticulosis, colonic lipoma  . ESOPHAGOGASTRODUODENOSCOPY (EGD) WITH PROPOFOL N/A 08/22/2017   Dr. Gala Romney: normal esophagus, normal stomach, normal duodenum  . POLYPECTOMY  08/22/2017   Procedure: POLYPECTOMY;  Surgeon: Daneil Dolin, MD;  [facility];  Service: Endoscopy;;  sigmoid    There were no vitals filed for this visit.  Subjective Assessment - 03/05/18 1608    Subjective  Pt reports that he felt a twinge in his shoulder when he was washing dishes earlier today.    Patient Stated Goals  to be able to move his arm without pain, sleep  better, lift     Currently in Pain?  No/denies    Pain Onset  More than a month ago           [facility] Adult PT Treatment/Exercise - 03/05/18 0001      Exercises   Exercises  Shoulder      Shoulder Exercises: Sidelying   External Rotation  Left;20 reps;Weights;Limitations    External Rotation Weight (lbs)  3    External Rotation Limitations  towel roll      Shoulder Exercises: Stretch   Other Shoulder Stretches  posterior capsule stretch LUE 3x30"      Manual Therapy   Manual Therapy  Soft tissue mobilization    Manual therapy comments  done separate from all other aspects of treatment.     Joint Mobilization  central PAs T1-T12 x2RT to improve mobility    Myofascial Release  L infraspinatus, teres minor after needling to further address taut bands/trigger points in order to decrease pain       Trigger Point Dry Needling - 03/05/18 1548    Consent Given?  Yes    Education Handout Provided  Yes    Muscles Treated Upper Body  Infraspinatus    Infraspinatus Response  Twitch response elicited;Palpable increased muscle length  L infraspinatus and teres minor in prone             PT Education - 03/05/18 1516    Education Details  risk and benefits of trigger point dry needling; will likely feel soreness following     Person(s) Educated  Patient    Methods  Explanation;Demonstration;Handout    Comprehension  Verbalized understanding;Returned demonstration       PT Short Term Goals - 03/03/18 1521      PT SHORT TERM GOAL #1   Title  Pt pain in his lt shoulder to decrease to no greater than a 6/10 so that pt is only being woke from his sleep 4 or less nights per week     Time  3    Period  Weeks    Status  Achieved      PT SHORT TERM GOAL #2   Title  PT to be able to lift a 5 pound item into a shelf that his shoulder height without increased pain     Time  3    Period  Weeks    Status  Partially Met      PT SHORT TERM GOAL #3   Title  Pt to be able to lift a  pot of water off the stove using both hands without increased pain.     Time  3    Period  Weeks    Status  Partially Met        PT Long Term Goals - 03/03/18 1522      PT LONG TERM GOAL #1   Title  Pt pain in his left shoulder to be no greater than a 2/10 to allow pt to not be waking up during the night due to shoulder pain     Time  6    Period  Weeks    Status  On-going      PT LONG TERM GOAL #2   Title  Pt strength in his left shoulder to have increased to allow pt to lift ten pounds onto a shelf that is shoulder height.    Time  6    Period  Weeks    Status  On-going      PT LONG TERM GOAL #3   Title  PT to be able to complete yard work/ house painting without experiencing increased pain in his left shoulder.     Time  6    Period  Weeks    Status  On-going      PT LONG TERM GOAL #4   Title  Pt ROM to have improved to allow pt to reach overhead for tasks such as changing light bulbs without difficulty     Time  6    Period  Weeks    Status  On-going            Plan - 03/05/18 1602    Clinical Impression Statement  Pt agreeable to trigger point dry needling after hearing the risks and benefits of it. PT able to elicit good twitch responses and palpable improvements in tissue length following needling to the infraspinatus and teres minor. Followed up with STM to the mm in order to facilitate blood flow to the area to promote mm recovery and decreased pain. Ended with active [facility] ER to activate both the infraspinatus and teres minor and then posterior capsule stretch. Pt still reporting minor twinge at ER elevation, but stated that it was better than at the beginning  of session. Pt educated that he will likely experience increased mm soreness and/or feelings of fatigue in this mm but that this is normal and should improve in 24-48hrs. Pt due for reassessment next visit.    Rehab Potential  Good    PT Frequency  3x / week    PT Duration  6 weeks    PT  Treatment/Interventions  ADLs/Self Care Home Management;Cryotherapy;Ultrasound;Therapeutic activities;Therapeutic exercise;Patient/family education;Passive range of motion;Manual techniques;Dry needling    PT Next Visit Plan  reassessment; assess response to dry needle L infraspinatus/teres minor;   s  gentle PROM to shoulder and  scapular mobilization.      PT Home Exercise Plan  EVAL: wand flexion, abduction, IR/ER; 01/27/18 - supine and side-lying pillow use at night. 8/26: wall arch, UE flexion, doorway stretch; 9/3/:  red t-band for IR/ER, flexion and ER self mobilization exercises.; 9/18: posterior capsule stretch    Consulted and Agree with Plan of Care  Patient       Patient will benefit from skilled therapeutic intervention in order to improve the following deficits and impairments:  Decreased activity tolerance, Decreased range of motion, Decreased strength, Increased fascial restricitons, Impaired perceived functional ability, Impaired flexibility, Impaired UE functional use, Pain  Visit Diagnosis: Stiffness of left shoulder, not elsewhere classified - Plan: PT plan of care cert/re-cert  Other symptoms and signs involving the musculoskeletal system - Plan: PT plan of care cert/re-cert  Chronic left shoulder pain - Plan: PT plan of care cert/re-cert     Problem List Patient Active Problem List   Diagnosis Date Noted  . Frequent stools 12/11/2017  . Loss of weight 12/11/2017  . Tubular adenoma of colon 09/02/2017  . Meralgia paresthetica of right side 08/26/2017  . Hyperlipidemia LDL goal <100 07/31/2017  . High risk medication use 07/31/2017  . Encounter for hepatitis C screening test for low risk patient 07/31/2017  . Hammer toe of left foot 07/31/2017  . Normocytic anemia 07/22/2017  . Nausea without vomiting 07/22/2017  . Intervertebral disc disorders with myelopathy of lumbar region 02/26/2017  . Spondylolysis of lumbar region 02/26/2017  . Essential hypertension  02/22/2017  . Benign prostatic hyperplasia without lower urinary tract symptoms 02/22/2017        Geraldine Solar PT, DPT  [facility] [address] [place], [place], [address] Phone: [phone]   Fax:  [phone]  Name: Charles Ayala MRN: [id number] Date of Birth: 1951-01-29

## 2018-03-05 NOTE — Patient Instructions (Signed)
Call and schedule with Dr. Domenic Polite. Due to OV and echo of heart at the end of November.  Keep your appointment for the carotid ultrasound on 9/25 You need liver tests, kidney, and potassium checked in January.

## 2018-03-05 NOTE — Patient Instructions (Signed)

## 2018-03-05 NOTE — Progress Notes (Signed)
Patient ID: Charles Ayala, male    DOB: 28-Sep-1950, 67 y.o.   MRN: [id number]  Chief Complaint  Patient presents with  . Follow-up    Allergies Patient has no known allergies.  Subjective:   Charles Ayala is a 67 y.o. male who presents to [facility] today.  HPI Charles Ayala presents today for follow-up.  He reports that he has been doing pretty well.  His toe has improved and he has been able to continue with his walking.  He was recently been seen by vascular and noted to have a carotid bruit.  Has a scheduled carotid ultrasound for September 25.  He is taking all of his medications as directed.  Denies any chest pain, shortness of breath, palpitations, or swelling in his extremities.  He is not diabetic.  Has been found to have peripheral artery disease specifically in his SFA.  Is trying to continue walking, aspirin, and statins to improve this.  Last LDL was not less than 70.Marland Kitchen  He has not wanted to titrate up the dose.  No myalgias or side effects with the medication.  He is leery that he will get side effects.  Taking his blood pressure medication.  No dizziness or syncope.  Still suffers from neuropathy.  Takes the gabapentin as directed.  Does need refills of his medication today.  Has a question regarding whether he should go back to see Dr. Domenic Polite.  Was seen approximately 10 months ago by Dr. Domenic Polite and review of note today says that he should follow-up with him again in November for repeat echo and evaluation.  He was initially sent there 10 months ago for noted murmur on exam.  He is not getting short of breath with exertion.   Past Medical History:  Diagnosis Date  . Anemia   . Arthritis   . Heart murmur   . History of stomach ulcers   . Hyperlipidemia   . Hypertension   . Neuropathy     Past Surgical History:  Procedure Laterality Date  . BACK SURGERY  1997   L4 L5  . CARPAL TUNNEL RELEASE Right   . COLONOSCOPY  02/2009   Dr. Laural Golden: few sigmoid  diverticula, one at cecum, small submucosal lipoma at prox transverse colon  . COLONOSCOPY WITH PROPOFOL N/A 08/22/2017   Dr. Gala Romney: two tubular adenomas, sigmoid diverticulosis, colonic lipoma  . ESOPHAGOGASTRODUODENOSCOPY (EGD) WITH PROPOFOL N/A 08/22/2017   Dr. Gala Romney: normal esophagus, normal stomach, normal duodenum  . POLYPECTOMY  08/22/2017   Procedure: POLYPECTOMY;  Surgeon: Daneil Dolin, MD;  Location: [facility];  Service: Endoscopy;;  sigmoid    Family History  Problem Relation Age of Onset  . Parkinson's disease Mother   . Hypertension Mother   . Hypertension Father   . Cancer Sister        Chemo related lung problems  . Diabetes Son   . Colon cancer Neg Hx   . Liver disease Neg Hx   . Pancreatic cancer Neg Hx      Social History   Socioeconomic History  . Marital status: Married    Spouse name: Not on file  . Number of children: Not on file  . Years of education: Not on file  . Highest education level: Not on file  Occupational History  . Occupation: retired  Scientific laboratory technician  . Financial resource strain: Not on file  . Food insecurity:    Worry: Not on file    Inability:  Not on file  . Transportation needs:    Medical: Not on file    Non-medical: Not on file  Tobacco Use  . Smoking status: Never Smoker  . Smokeless tobacco: Never Used  Substance and Sexual Activity  . Alcohol use: No  . Drug use: No  . Sexual activity: Yes  Lifestyle  . Physical activity:    Days per week: Not on file    Minutes per session: Not on file  . Stress: Not on file  Relationships  . Social connections:    Talks on phone: Not on file    Gets together: Not on file    Attends religious service: Not on file    Active member of club or organization: Not on file    Attends meetings of clubs or organizations: Not on file    Relationship status: Not on file  Other Topics Concern  . Not on file  Social History Narrative   Retired. Used to work at [employer]. Married.  Lives in [place]. Used to live in [place]. Has four children. Enjoys gardening and playing with grandchildren.    Current Outpatient Medications on File Prior to Visit  Medication Sig Dispense Refill  . aspirin EC 81 MG tablet Take 1 tablet (81 mg total) by mouth daily. 90 tablet 3  . HYDROcodone-acetaminophen (NORCO) 7.5-325 MG tablet Take 1-2 tablets by mouth every 6 (six) hours as needed.    . tamsulosin (FLOMAX) 0.4 MG CAPS capsule Take 1 capsule (0.4 mg total) daily by mouth. 90 capsule 3  . traMADol (ULTRAM) 50 MG tablet Take 50-100 mg by mouth every 8 (eight) hours as needed (for pain.).      No current facility-administered medications on file prior to visit.     Review of Systems  Constitutional: Negative for appetite change, chills, fatigue, fever and unexpected weight change.  HENT: Negative for trouble swallowing and voice change.   Eyes: Negative for visual disturbance.  Respiratory: Negative for cough, chest tightness, shortness of breath and wheezing.   Cardiovascular: Negative for chest pain, palpitations and leg swelling.  Gastrointestinal: Negative for abdominal pain, diarrhea, nausea and vomiting.  Genitourinary: Negative for decreased urine volume, dysuria and frequency.  Musculoskeletal: Negative for myalgias.  Skin: Negative for rash.  Neurological: Negative for dizziness, tremors, syncope, facial asymmetry, speech difficulty, weakness, numbness and headaches.  Hematological: Negative for adenopathy. Does not bruise/bleed easily.  Psychiatric/Behavioral: Negative for dysphoric mood, hallucinations, sleep disturbance and suicidal ideas. The patient is not nervous/anxious.    Objective:   BP 126/70 (BP Location: Right Arm, Patient Position: Sitting, Cuff Size: Normal)   Pulse (!) 54   Temp 98.4 F (36.9 C) (Temporal)   Resp 12   Ht 6' (1.829 m)   Wt 181 lb (82.1 kg)   SpO2 97% Comment: room air  BMI 24.55 kg/m   Physical Exam  Constitutional: He is oriented  to person, place, and time. He appears well-developed and well-nourished.  HENT:  Head: Normocephalic and atraumatic.  Eyes: Pupils are equal, round, and reactive to light. EOM are normal.  Neck: Normal range of motion. Neck supple. No thyromegaly present.  Cardiovascular: Normal rate and regular rhythm.  Murmur heard.  Systolic murmur is present with a grade of 2/6. Pulmonary/Chest: Effort normal and breath sounds normal.  Musculoskeletal: He exhibits no edema.  Neurological: He is alert and oriented to person, place, and time. No cranial nerve deficit.  Skin: Skin is warm, dry and intact.  Psychiatric: He  has a normal mood and affect. His behavior is normal. Thought content normal.  Vitals reviewed. No carotid bruit auscultated. Depression screen [facility] 2/9 03/05/2018 01/08/2018 11/21/2017 07/31/2017 02/22/2017  Decreased Interest 0 0 0 0 0  Down, Depressed, Hopeless 0 0 0 0 0  PHQ - 2 Score 0 0 0 0 0    Assessment and Plan  1. Hyperlipidemia LDL goal <100 Stable.  Continue current medications.  Needs LFTs checked in January. - simvastatin (ZOCOR) 20 MG tablet; Take 1 tablet (20 mg total) by mouth at bedtime.  Dispense: 90 tablet; Refill: 1  2. Heart murmur Follow-up with Dr. Domenic Polite as directed for office visit and echo.  Due for appointment May 17, 2018.  3. Immunization due Vaccination given. - Pneumococcal polysaccharide vaccine 23-valent greater than or equal to 2yo subcutaneous/IM  4. Essential hypertension Stable.  Continue current medications.  Monitor salt intake.  Continue with diet, exercise, and healthy lifestyle modifications.  Due for BMP check in January. - hydrochlorothiazide (HYDRODIURIL) 25 MG tablet; Take 1 tablet (25 mg total) by mouth daily.  Dispense: 90 tablet; Refill: 1  5. Neuropathy Chronic.  Been evaluated by neurology in the past.  Continue Neurontin as directed.  Patient has been stable on this dose for a long time.  Understands sedation  precautions. - gabapentin (NEURONTIN) 300 MG capsule; Take 2 pills in the am, 1 pill in the afternoon, and 2 pills in the evening  Dispense: 450 capsule; Refill: 1 Patient was asked to schedule with new PCP office within the next 3 months.  Continue medication as directed and keep his routine follow-up appointments and health maintenance as directed. No follow-ups on file. Caren Macadam, MD 03/05/2018

## 2018-03-06 DIAGNOSIS — M5106 Intervertebral disc disorders with myelopathy, lumbar region: Secondary | ICD-10-CM | POA: Diagnosis not present

## 2018-03-06 DIAGNOSIS — M4306 Spondylolysis, lumbar region: Secondary | ICD-10-CM | POA: Diagnosis not present

## 2018-03-07 ENCOUNTER — Ambulatory Visit (HOSPITAL_COMMUNITY): Payer: PPO | Admitting: Physical Therapy

## 2018-03-07 ENCOUNTER — Encounter (HOSPITAL_COMMUNITY): Payer: Self-pay | Admitting: Physical Therapy

## 2018-03-07 DIAGNOSIS — M25512 Pain in left shoulder: Secondary | ICD-10-CM

## 2018-03-07 DIAGNOSIS — M25612 Stiffness of left shoulder, not elsewhere classified: Secondary | ICD-10-CM | POA: Diagnosis not present

## 2018-03-07 DIAGNOSIS — G8929 Other chronic pain: Secondary | ICD-10-CM

## 2018-03-07 DIAGNOSIS — R29898 Other symptoms and signs involving the musculoskeletal system: Secondary | ICD-10-CM

## 2018-03-07 NOTE — Patient Instructions (Addendum)
  FREE WEIGHT - EXTERNAL ROTATION - ER Lie on your side and hold a weight with your elbow bent and rested on your side. Place a small rolled up towel between your upper arm and body. Next, move your forearm and hand from the ground towards the ceiling as shown. Lower back down and repeat. 3# weight  Repeat 20 Times Hold 1 Second Complete 1 Set Perform 3 Time(s) a Week   PRONE RETRACTION EXTENSION - PRONE I Lying face down with your arms by your side, slowly move your arms upward towards the ceiling as you squeeze your shoulder blades downwards and towards your spine. With 3# weights Repeat 15 Times Hold 1 Second Complete 1 Set Perform 3 Time(s) a Week

## 2018-03-07 NOTE — Therapy (Signed)
[facility] [address] [place], [place], [address] Phone: [phone]   Fax:  [phone]  Physical Therapy Treatment / Progress note / Discharge Summary  Patient Details  Name: Charles Ayala MRN: [id number] Date of Birth: 01-09-1951 Referring Provider: Caren Macadam    Encounter Date: 03/07/2018   PHYSICAL THERAPY DISCHARGE SUMMARY  Visits from Start of Care: 18  Current functional level related to goals / functional outcomes: See below   Remaining deficits: See below   Education / Equipment: See below Plan: Patient agrees to discharge.  Patient goals were partially met. Patient is being discharged due to being pleased with the current functional level.  ?????         PT End of Session - 03/07/18 1326    Visit Number  18    Number of Visits  18    Date for PT Re-Evaluation  03/07/18    Authorization Type  [employer]    Authorization Time Period  Cert: 3/8--> 4/66/59; 03/05/18 to 03/07/18    Authorization - Visit Number  18    Authorization - Number of Visits  20    PT Start Time  9357    PT Stop Time  0177    PT Time Calculation (min)  25 min    Activity Tolerance  Patient tolerated treatment well;No increased pain    Behavior During Therapy  WFL for tasks assessed/performed       Past Medical History:  Diagnosis Date  . Anemia   . Arthritis   . Heart murmur   . History of stomach ulcers   . Hyperlipidemia   . Hypertension   . Neuropathy     Past Surgical History:  Procedure Laterality Date  . BACK SURGERY  1997   L4 L5  . CARPAL TUNNEL RELEASE Right   . COLONOSCOPY  02/2009   Dr. Laural Golden: few sigmoid diverticula, one at cecum, small submucosal lipoma at prox transverse colon  . COLONOSCOPY WITH PROPOFOL N/A 08/22/2017   Dr. Gala Romney: two tubular adenomas, sigmoid diverticulosis, colonic lipoma  . ESOPHAGOGASTRODUODENOSCOPY (EGD) WITH PROPOFOL N/A 08/22/2017   Dr. Gala Romney: normal esophagus, normal  stomach, normal duodenum  . POLYPECTOMY  08/22/2017   Procedure: POLYPECTOMY;  Surgeon: Daneil Dolin, MD;  [facility];  Service: Endoscopy;;  sigmoid    There were no vitals filed for this visit.  Subjective Assessment - 03/07/18 1301    Subjective  Patient reported that he feels like he is doing better. He stated he is ready to be discharged and is pleased with his functional level. Patient reported that he is not experiencing any adverse affects to dry needling and reported it was helpful.     Patient Stated Goals  to be able to move his arm without pain, sleep better, lift     Currently in Pain?  No/denies         [facility] PT Assessment - 03/07/18 0001      Assessment   Medical Diagnosis  Lt shoulder pain    Onset Date/Surgical Date  12/22/16      Prior Function   Level of Independence  Independent    Vocation  Retired      Associate Professor   Overall Cognitive Status  Within Functional Limits for tasks assessed      Observation/Other Assessments   Observations  Lifting 10# to shoulder level without pain    Focus on Therapeutic Outcomes (FOTO)   71% (29% limited;  was 54% 46% limited)      AROM   Left Shoulder Flexion  151 Degrees   initially 120   Left Shoulder ABduction  150 Degrees   was 90   Left Shoulder Internal Rotation  80 Degrees    Left Shoulder External Rotation  80 Degrees      Strength   Right Shoulder Flexion  5/5    Right Shoulder Extension  5/5    Right Shoulder ABduction  5/5    Right Shoulder Internal Rotation  5/5    Right Shoulder External Rotation  5/5    Left Shoulder Flexion  5/5    Left Shoulder Extension  5/5    Left Shoulder ABduction  5/5    Left Shoulder Internal Rotation  5/5    Left Shoulder External Rotation  5/5   was 4+                          PT Education - 03/07/18 1325    Education Details  Discussed results of re-assessment and plans for discharge on this date as well as updated HEP and information  on the senior center.     Person(s) Educated  Patient    Methods  Explanation;Handout    Comprehension  Verbalized understanding       PT Short Term Goals - 03/07/18 1327      PT SHORT TERM GOAL #1   Title  Pt pain in his lt shoulder to decrease to no greater than a 6/10 so that pt is only being woke from his sleep 4 or less nights per week     Time  3    Period  Weeks    Status  Achieved      PT SHORT TERM GOAL #2   Title  PT to be able to lift a 5 pound item into a shelf that his shoulder height without increased pain     Baseline  03/07/18: Can lift 10# without pain    Time  3    Period  Weeks    Status  Achieved      PT SHORT TERM GOAL #3   Title  Pt to be able to lift a pot of water off the stove using both hands without increased pain.     Baseline  03/07/18: Can perform without pain    Time  3    Period  Weeks    Status  Achieved        PT Long Term Goals - 03/07/18 1327      PT LONG TERM GOAL #1   Title  Pt pain in his left shoulder to be no greater than a 2/10 to allow pt to not be waking up during the night due to shoulder pain     Baseline  03/07/18: Patient stated he thinks he has just about 2/10 pain maximum at this point.     Time  6    Period  Weeks    Status  Achieved      PT LONG TERM GOAL #2   Title  Pt strength in his left shoulder to have increased to allow pt to lift ten pounds onto a shelf that is shoulder height.    Baseline  03/07/18: Patient is able to lift 10# onto a shoulder level shelf without pain.     Time  6    Period  Weeks    Status  Achieved  PT LONG TERM GOAL #3   Title  PT to be able to complete yard work/ house painting without experiencing increased pain in his left shoulder.     Baseline  03/07/18: Patient denied any difficulties with performing current housework.     Time  6    Period  Weeks    Status  Achieved      PT LONG TERM GOAL #4   Title  Pt ROM to have improved to allow pt to reach overhead for tasks such as  changing light bulbs without difficulty     Baseline  03/07/18: Patient able to reach overhead, about 150 degrees.     Time  6    Period  Weeks    Status  Partially Met            Plan - 03/07/18 1342    Clinical Impression Statement  This session performed a re-assessment to see patient's progress with goals. Patient achieved 3 out of 3 short term goals. Patient has achieved 3 out of 4 long term goals. Patient's left shoulder flexion AROM remained at 151. Patient reported feeling a decrease in pain in his left shoulder. He also stated that he is pleased with how he is functioning currently. Patient stated he does have some lower back pain which therapist explained patient would need to return to physician to have a referral for his back pain to address this further. Remainder of session educated patient on updated HEP and discussed access to [employer]. Patient is being discharged at this time as he is pleased with his current functional level.     Rehab Potential  Good    PT Frequency  3x / week    PT Duration  6 weeks    PT Treatment/Interventions  ADLs/Self Care Home Management;Cryotherapy;Ultrasound;Therapeutic activities;Therapeutic exercise;Patient/family education;Passive range of motion;Manual techniques;Dry needling    PT Next Visit Plan  Discharged    PT Home Exercise Plan  EVAL: wand flexion, abduction, IR/ER; 01/27/18 - supine and side-lying pillow use at night. 8/26: wall arch, UE flexion, doorway stretch; 9/3/:  red t-band for IR/ER, flexion and ER self mobilization exercises.; 9/18: posterior capsule stretch; 03/07/18: Discontinue theraband ER/IR and perform free weight 20x with 3# 3x/week; Prone extension with free weight 15x 3# 3x/week      Consulted and Agree with Plan of Care  Patient       Patient will benefit from skilled therapeutic intervention in order to improve the following deficits and impairments:  Decreased activity tolerance, Decreased range of motion,  Decreased strength, Increased fascial restricitons, Impaired perceived functional ability, Impaired flexibility, Impaired UE functional use, Pain  Visit Diagnosis: Stiffness of left shoulder, not elsewhere classified  Other symptoms and signs involving the musculoskeletal system  Chronic left shoulder pain     Problem List Patient Active Problem List   Diagnosis Date Noted  . Frequent stools 12/11/2017  . Loss of weight 12/11/2017  . Tubular adenoma of colon 09/02/2017  . Meralgia paresthetica of right side 08/26/2017  . Hyperlipidemia LDL goal <100 07/31/2017  . High risk medication use 07/31/2017  . Encounter for hepatitis C screening test for low risk patient 07/31/2017  . Hammer toe of left foot 07/31/2017  . Normocytic anemia 07/22/2017  . Nausea without vomiting 07/22/2017  . Intervertebral disc disorders with myelopathy of lumbar region 02/26/2017  . Spondylolysis of lumbar region 02/26/2017  . Essential hypertension 02/22/2017  . Benign prostatic hyperplasia without lower urinary tract symptoms 02/22/2017  Clarene Critchley PT, DPT 1:51 PM, 03/07/18 [place], [place], [address] Phone: [phone]   Fax:  [phone]  Name: Charles Ayala MRN: [id number] Date of Birth: 1950-08-07

## 2018-03-11 ENCOUNTER — Ambulatory Visit: Payer: PPO | Admitting: Family Medicine

## 2018-03-12 ENCOUNTER — Ambulatory Visit (INDEPENDENT_AMBULATORY_CARE_PROVIDER_SITE_OTHER): Payer: PPO

## 2018-03-12 DIAGNOSIS — R0989 Other specified symptoms and signs involving the circulatory and respiratory systems: Secondary | ICD-10-CM

## 2018-03-13 ENCOUNTER — Encounter (HOSPITAL_COMMUNITY): Payer: PPO

## 2018-03-20 ENCOUNTER — Telehealth: Payer: Self-pay | Admitting: *Deleted

## 2018-03-20 DIAGNOSIS — R0989 Other specified symptoms and signs involving the circulatory and respiratory systems: Secondary | ICD-10-CM

## 2018-03-20 NOTE — Telephone Encounter (Signed)
Left a message for the patient to call back.  

## 2018-03-20 NOTE — Telephone Encounter (Signed)
Patient made aware of results and verbalized understanding. Repeat orders placed.    

## 2018-03-20 NOTE — Telephone Encounter (Signed)
-----   Message from Wellington Hampshire, MD sent at 03/19/2018  5:00 PM EDT ----- Inform patient that carotid Doppler showed moderate right carotid stenosis not enough to require surgery.  We have to monitor this.  Repeat study in 1 year.

## 2018-04-14 ENCOUNTER — Other Ambulatory Visit: Payer: Self-pay | Admitting: Family Medicine

## 2018-04-14 DIAGNOSIS — N400 Enlarged prostate without lower urinary tract symptoms: Secondary | ICD-10-CM

## 2018-05-13 DIAGNOSIS — H40013 Open angle with borderline findings, low risk, bilateral: Secondary | ICD-10-CM | POA: Diagnosis not present

## 2018-05-13 DIAGNOSIS — H524 Presbyopia: Secondary | ICD-10-CM | POA: Diagnosis not present

## 2018-05-13 DIAGNOSIS — H5203 Hypermetropia, bilateral: Secondary | ICD-10-CM | POA: Diagnosis not present

## 2018-05-13 DIAGNOSIS — H2513 Age-related nuclear cataract, bilateral: Secondary | ICD-10-CM | POA: Diagnosis not present

## 2018-06-03 NOTE — Progress Notes (Signed)
Cardiology Office Note  Date: 06/04/2018   ID: Charles Ayala, DOB 10/06/1950, MRN [id number]  PCP: Patient, No Pcp Per  Primary Cardiologist: Charles Lesches, MD   Chief Complaint  Patient presents with  . Valvular heart disease    History of Present Illness: Charles Ayala is a 67 y.o. male last seen in November 2018.  He is here today with his wife for a routine visit.  He does not report any change in stamina, no breathlessness or exertional chest pain.  He had been walking regularly up to 2 or 3 miles at a time with his wife before the weather got cold.  Interval visit noted with Dr. Fletcher Anon in March for management of PAD.  He does not report any progressive claudication.  He is due for a follow-up echocardiogram for reassessment of aortic stenosis and mitral regurgitation.  Past Medical History:  Diagnosis Date  . Anemia   . Arthritis   . Heart murmur   . History of stomach ulcers   . Hyperlipidemia   . Hypertension   . Neuropathy     Past Surgical History:  Procedure Laterality Date  . BACK SURGERY  1997   L4 L5  . CARPAL TUNNEL RELEASE Right   . COLONOSCOPY  02/2009   Dr. Laural Ayala: few sigmoid diverticula, one at cecum, small submucosal lipoma at prox transverse colon  . COLONOSCOPY WITH PROPOFOL N/A 08/22/2017   Dr. Gala Ayala: two tubular adenomas, sigmoid diverticulosis, colonic lipoma  . ESOPHAGOGASTRODUODENOSCOPY (EGD) WITH PROPOFOL N/A 08/22/2017   Dr. Gala Ayala: normal esophagus, normal stomach, normal duodenum  . POLYPECTOMY  08/22/2017   Procedure: POLYPECTOMY;  Surgeon: Charles Dolin, MD;  [facility];  Service: Endoscopy;;  sigmoid    Current Outpatient Medications  Medication Sig Dispense Refill  . aspirin EC 81 MG tablet Take 1 tablet (81 mg total) by mouth daily. 90 tablet 3  . gabapentin (NEURONTIN) 300 MG capsule Take 2 pills in the am, 1 pill in the afternoon, and 2 pills in the evening 450 capsule 1  . hydrochlorothiazide (HYDRODIURIL) 25 MG  tablet Take 1 tablet (25 mg total) by mouth daily. 90 tablet 1  . HYDROcodone-acetaminophen (NORCO) 7.5-325 MG tablet Take 1-2 tablets by mouth every 6 (six) hours as needed.    . simvastatin (ZOCOR) 20 MG tablet Take 1 tablet (20 mg total) by mouth at bedtime. 90 tablet 1  . tamsulosin (FLOMAX) 0.4 MG CAPS capsule Take 1 capsule (0.4 mg total) daily by mouth. 90 capsule 3  . traMADol (ULTRAM) 50 MG tablet Take 50-100 mg by mouth every 8 (eight) hours as needed (for pain.).      No current facility-administered medications for this visit.    Allergies:  Patient has no known allergies.   Social History: The patient  reports that he has never smoked. He has never used smokeless tobacco. He reports that he does not drink alcohol or use drugs.   ROS:  Please see the history of present illness. Otherwise, complete review of systems is positive for none.  All other systems are reviewed and negative.   Physical Exam: VS:  BP (!) 154/67   Pulse (!) 53   Ht 6' (1.829 m)   Wt 191 lb (86.6 kg)   SpO2 99%   BMI 25.90 kg/m , BMI Body mass index is 25.9 kg/m.  Wt Readings from Last 3 Encounters:  06/04/18 191 lb (86.6 kg)  03/05/18 181 lb (82.1 kg)  03/04/18 181  lb 12.8 oz (82.5 kg)    General: Patient appears comfortable at rest. HEENT: Conjunctiva and lids normal, oropharynx clear. Neck: Supple, no elevated JVP or carotid bruits, no thyromegaly. Lungs: Clear to auscultation, nonlabored breathing at rest. Cardiac: Regular rate and rhythm, no S3, 2-6/3 systolic murmur, no pericardial rub. Abdomen: Soft, nontender, bowel sounds present. Extremities: No pitting edema, distal pulses 2+. Skin: Warm and dry. Musculoskeletal: No kyphosis. Neuropsychiatric: Alert and oriented x3, affect grossly appropriate.  ECG: I personally reviewed the tracing from 03/04/2018 which showed sinus bradycardia with PVCs.  Recent Labwork: 07/25/2017: TSH 1.50 01/03/2018: ALT 8; AST 15; BUN 17; Creat 0.85; Hemoglobin  11.9; Platelets 217; Potassium 4.3; Sodium 140     Component Value Date/Time   CHOL 185 01/03/2018 0917   TRIG 47 01/03/2018 0917   HDL 59 01/03/2018 0917   CHOLHDL 3.1 01/03/2018 0917   LDLCALC 112 (H) 01/03/2018 0917    Other Studies Reviewed Today:  Carotid Dopplers 03/12/2018: Final Interpretation:  Right Carotid: Velocities in the right ICA are consistent with a 40-59%         stenosis.    Left Carotid: Velocities in the left ICA are consistent with a 1-39% stenosis.    Vertebrals: Bilateral vertebral arteries demonstrate antegrade flow.  Subclavians: Normal flow hemodynamics were seen in bilateral subclavian        arteries.   Echocardiogram 05/16/2017: Study Conclusions  - Left ventricle: The cavity size was normal. Systolic function was   normal. The estimated ejection fraction was in the range of 60%   to 65%. Wall motion was normal; there were no regional wall   motion abnormalities. Left ventricular diastolic function   parameters were normal. Mild focal basal septal hypertrophy. - Aortic valve: Mildly thickened, mildly calcified leaflets. There   was mild stenosis. Peak velocity (S): 231 cm/s. Mean gradient   (S): 10 mm Hg. Valve area (VTI): 1.8 cm^2. Valve area (Vmax):   1.67 cm^2. Valve area (Vmean): 1.73 cm^2. - Mitral valve: Mildly thickened leaflets . Mild posterior leaflet   prolapse. There was moderate regurgitation. - Tricuspid valve: There was mild regurgitation. - Pulmonic valve: There was mild regurgitation.  Assessment and Plan:  1.  Valvular heart disease.  Echocardiogram from last year showed mild aortic stenosis as well as mild posterior leaflet mitral prolapse with moderate mitral regurgitation.  Heart murmur is relatively stable and he reports no obvious symptoms.  We will obtain a follow-up echocardiogram this year to ensure relative stability, and can then likely plan a clinical visit in 1 year.  2.  Essential hypertension,  currently on hydrochlorothiazide.  He is establishing with a new PCP.  3.  Hyperlipidemia, on Zocor.  Last LDL was 112.  He is establishing with a new PCP.  Current medicines were reviewed with the patient today.   Orders Placed This Encounter  Procedures  . ECHOCARDIOGRAM COMPLETE    Disposition: Follow-up in 1 year.  Signed, Satira Sark, MD, [facility] 06/04/2018 11:50 AM    [place] [facility] [place], [place], [place] [address] Phone: [phone]; Fax: [phone]

## 2018-06-04 ENCOUNTER — Ambulatory Visit: Payer: PPO | Admitting: Cardiology

## 2018-06-04 ENCOUNTER — Encounter: Payer: Self-pay | Admitting: Cardiology

## 2018-06-04 VITALS — BP 154/67 | HR 53 | Ht 72.0 in | Wt 191.0 lb

## 2018-06-04 DIAGNOSIS — E782 Mixed hyperlipidemia: Secondary | ICD-10-CM

## 2018-06-04 DIAGNOSIS — I341 Nonrheumatic mitral (valve) prolapse: Secondary | ICD-10-CM | POA: Diagnosis not present

## 2018-06-04 DIAGNOSIS — I10 Essential (primary) hypertension: Secondary | ICD-10-CM | POA: Diagnosis not present

## 2018-06-04 DIAGNOSIS — I350 Nonrheumatic aortic (valve) stenosis: Secondary | ICD-10-CM | POA: Diagnosis not present

## 2018-06-04 NOTE — Patient Instructions (Signed)
Medication Instructions:   Your physician recommends that you continue on your current medications as directed. Please refer to the Current Medication list given to you today.  Labwork:  NONE  Testing/Procedures: Your physician has requested that you have an echocardiogram. Echocardiography is a painless test that uses sound waves to create images of your heart. It provides your doctor with information about the size and shape of your heart and how well your heart's chambers and valves are working. This procedure takes approximately one hour. There are no restrictions for this procedure.  Follow-Up:  Your physician recommends that you schedule a follow-up appointment in: 1 year. You will receive a reminder letter in the mail in about 4 months reminding you to call and schedule your appointment. If you don't receive this letter, please contact our office.  Any Other Special Instructions Will Be Listed Below (If Applicable).  If you need a refill on your cardiac medications before your next appointment, please call your pharmacy.

## 2018-06-13 DIAGNOSIS — M5106 Intervertebral disc disorders with myelopathy, lumbar region: Secondary | ICD-10-CM | POA: Diagnosis not present

## 2018-06-13 DIAGNOSIS — M4306 Spondylolysis, lumbar region: Secondary | ICD-10-CM | POA: Diagnosis not present

## 2018-06-17 ENCOUNTER — Other Ambulatory Visit: Payer: Self-pay

## 2018-06-17 ENCOUNTER — Ambulatory Visit (INDEPENDENT_AMBULATORY_CARE_PROVIDER_SITE_OTHER): Payer: PPO

## 2018-06-17 DIAGNOSIS — I350 Nonrheumatic aortic (valve) stenosis: Secondary | ICD-10-CM | POA: Diagnosis not present

## 2018-06-19 ENCOUNTER — Telehealth: Payer: Self-pay | Admitting: *Deleted

## 2018-06-19 NOTE — Telephone Encounter (Signed)
Patient informed. Copy sent to PCP °

## 2018-06-19 NOTE — Telephone Encounter (Signed)
-----   Message from Erma Heritage, [place] sent at 06/17/2018  2:20 PM EST ----- Covering for Dr. Domenic Polite  - Please let the patient know that his echocardiogram showed normal pumping function of the heart with a preserved EF of 60 to 65%. Wall motion of the heart was normal. He did have mild thickness of the heart muscle which is common to see with known hypertension. Leakage along the aortic valve remains mild and he has moderate mitral regurgitation which is essentially unchanged from prior imaging. Thank you.

## 2018-06-27 DIAGNOSIS — M479 Spondylosis, unspecified: Secondary | ICD-10-CM | POA: Diagnosis not present

## 2018-06-27 DIAGNOSIS — E7800 Pure hypercholesterolemia, unspecified: Secondary | ICD-10-CM | POA: Diagnosis not present

## 2018-06-27 DIAGNOSIS — Z789 Other specified health status: Secondary | ICD-10-CM | POA: Diagnosis not present

## 2018-06-27 DIAGNOSIS — I10 Essential (primary) hypertension: Secondary | ICD-10-CM | POA: Diagnosis not present

## 2018-06-27 DIAGNOSIS — Z299 Encounter for prophylactic measures, unspecified: Secondary | ICD-10-CM | POA: Diagnosis not present

## 2018-06-27 DIAGNOSIS — Z6825 Body mass index (BMI) 25.0-25.9, adult: Secondary | ICD-10-CM | POA: Diagnosis not present

## 2018-07-11 ENCOUNTER — Other Ambulatory Visit (HOSPITAL_COMMUNITY): Payer: PPO

## 2018-07-18 ENCOUNTER — Ambulatory Visit (HOSPITAL_COMMUNITY): Payer: PPO | Admitting: Internal Medicine

## 2018-07-18 ENCOUNTER — Ambulatory Visit (HOSPITAL_COMMUNITY): Payer: PPO | Admitting: Hematology

## 2018-07-30 DIAGNOSIS — Z299 Encounter for prophylactic measures, unspecified: Secondary | ICD-10-CM | POA: Diagnosis not present

## 2018-07-30 DIAGNOSIS — Z6826 Body mass index (BMI) 26.0-26.9, adult: Secondary | ICD-10-CM | POA: Diagnosis not present

## 2018-07-30 DIAGNOSIS — I10 Essential (primary) hypertension: Secondary | ICD-10-CM | POA: Diagnosis not present

## 2018-07-30 DIAGNOSIS — E7800 Pure hypercholesterolemia, unspecified: Secondary | ICD-10-CM | POA: Diagnosis not present

## 2018-08-06 DIAGNOSIS — M4306 Spondylolysis, lumbar region: Secondary | ICD-10-CM | POA: Diagnosis not present

## 2018-08-06 DIAGNOSIS — M5106 Intervertebral disc disorders with myelopathy, lumbar region: Secondary | ICD-10-CM | POA: Diagnosis not present

## 2018-08-15 DIAGNOSIS — M5106 Intervertebral disc disorders with myelopathy, lumbar region: Secondary | ICD-10-CM | POA: Diagnosis not present

## 2018-08-15 DIAGNOSIS — M47816 Spondylosis without myelopathy or radiculopathy, lumbar region: Secondary | ICD-10-CM | POA: Diagnosis not present

## 2018-08-15 DIAGNOSIS — M48061 Spinal stenosis, lumbar region without neurogenic claudication: Secondary | ICD-10-CM | POA: Diagnosis not present

## 2018-09-08 IMAGING — CT CT ABD-PELV W/ CM
2 of 8 series · 14 of 46 positions shown, 19 images · IV contrast (Isovue)
Comparison: Lumbar spine MRI dated 03/05/2013.

CLINICAL DATA: 60 lb weight loss in the past 1-2 months.

EXAM:
CT ABDOMEN AND PELVIS WITH CONTRAST
TECHNIQUE: Multidetector CT imaging of the abdomen and pelvis was performed
using the standard protocol following bolus administration of
intravenous contrast.
CONTRAST:  100mL [id number] IOPAMIDOL ([id number]-R44) INJECTION 61%

[Series 2: axial st · axial · 0.72mm/px · z∈[+609,+959]mm · 11 of 82 slices shown, 16 images]
[im 6/82  soft-tissue]
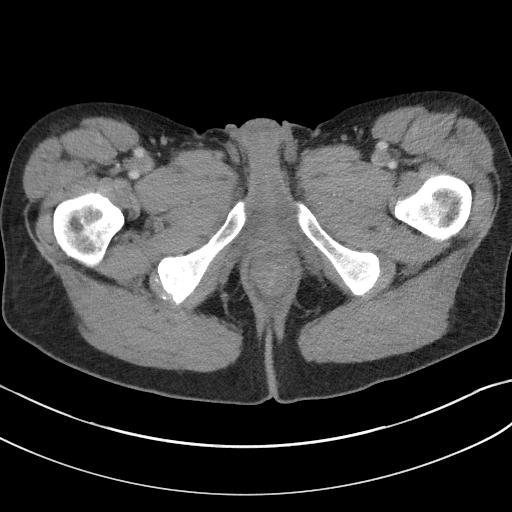
[im 6/82  bone]
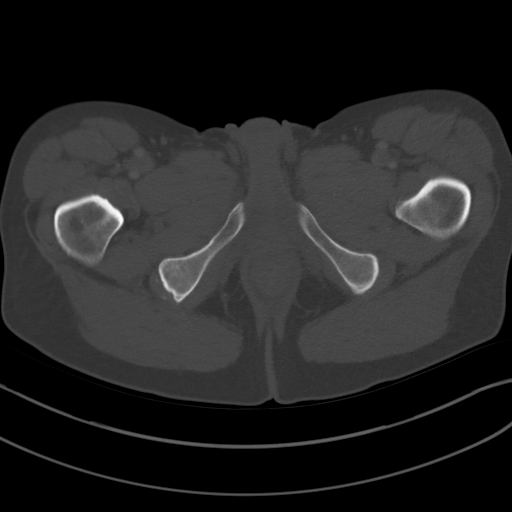
[im 16/82  soft-tissue]
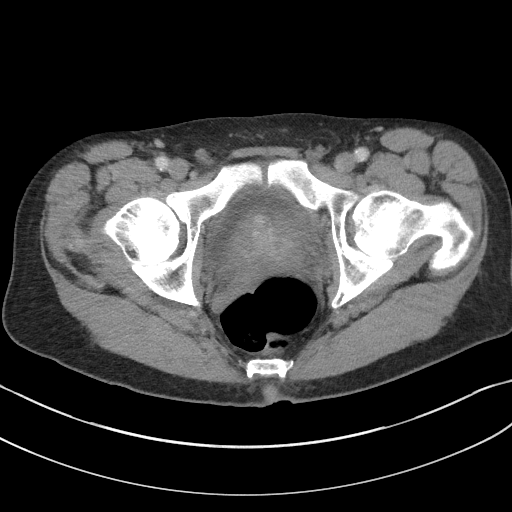
[im 21/82  soft-tissue]
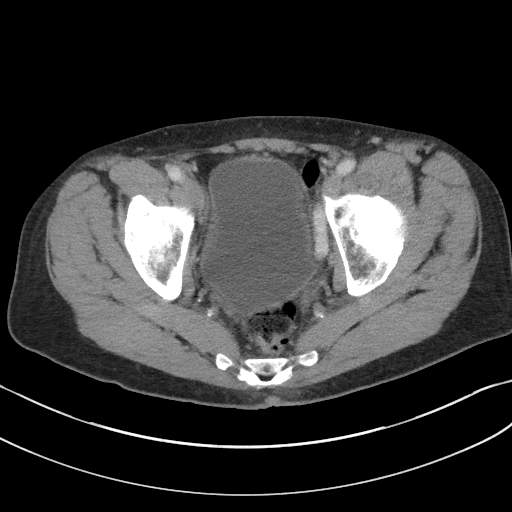
[im 31/82  soft-tissue]
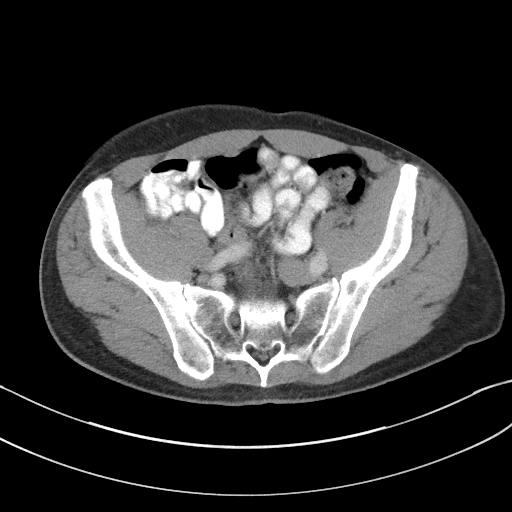
[im 36/82  soft-tissue]
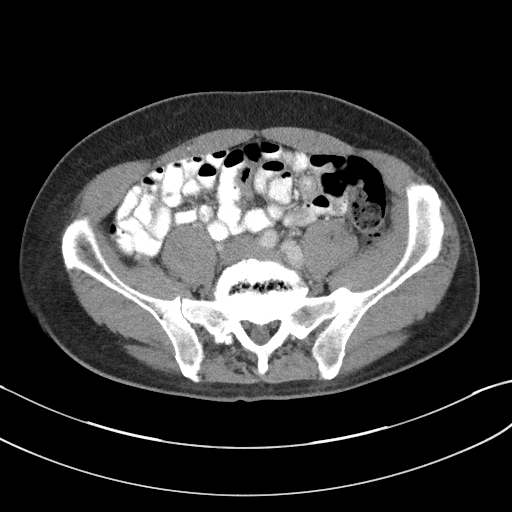
[im 46/82  soft-tissue]
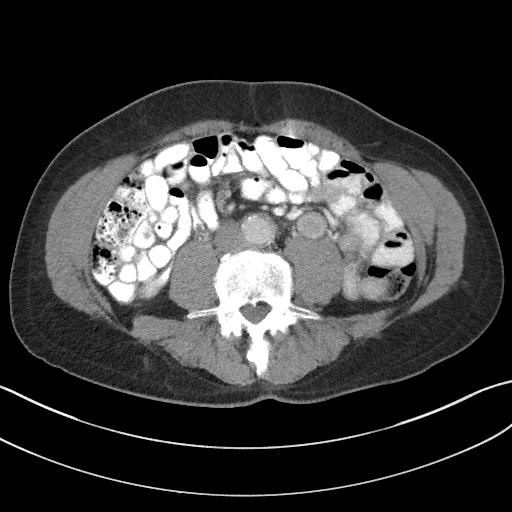
[im 51/82  soft-tissue]
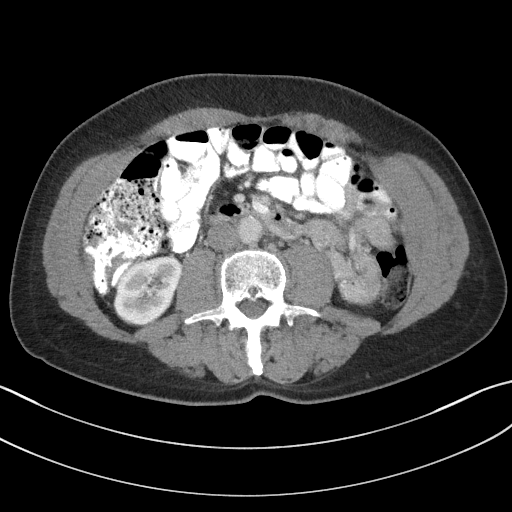
[im 61/82  soft-tissue]
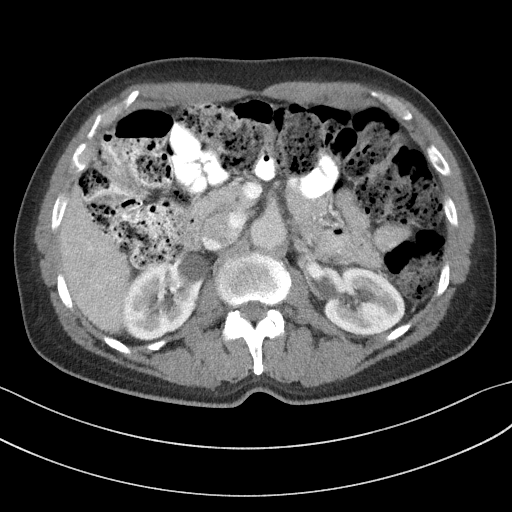
[im 61/82  lung]
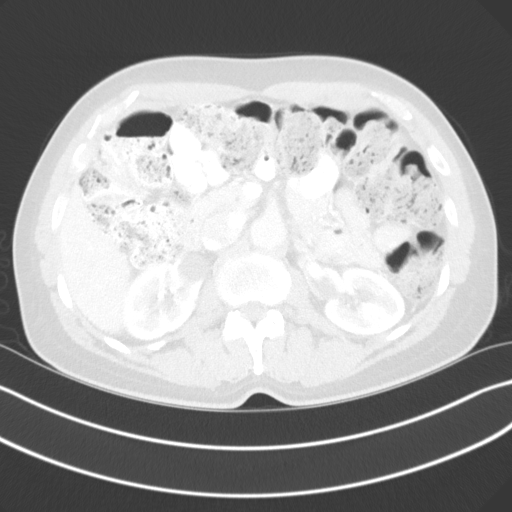
[im 66/82  soft-tissue]
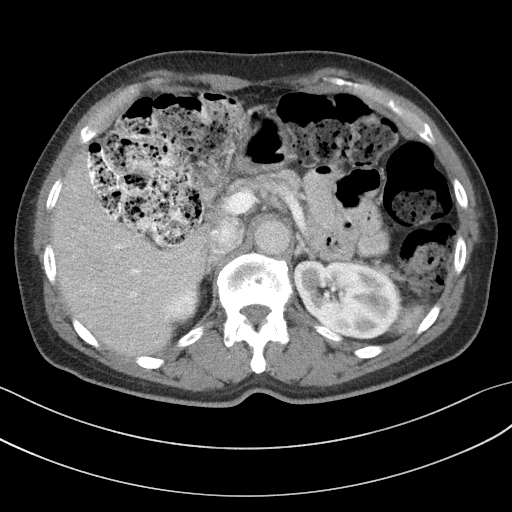
[im 66/82  lung]
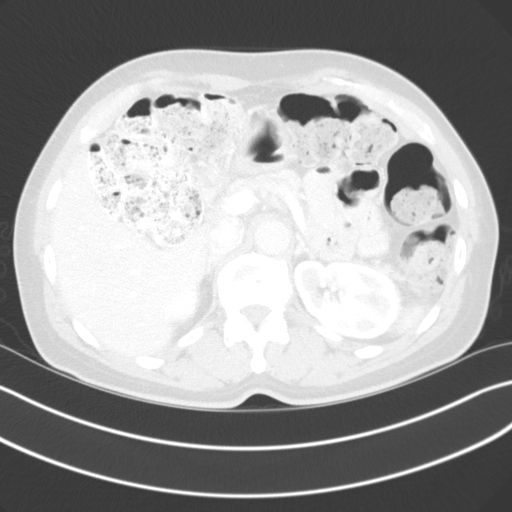
[im 66/82  bone]
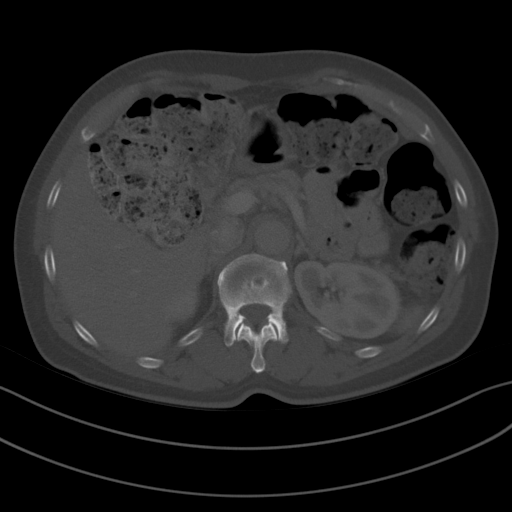
[im 71/82  lung]
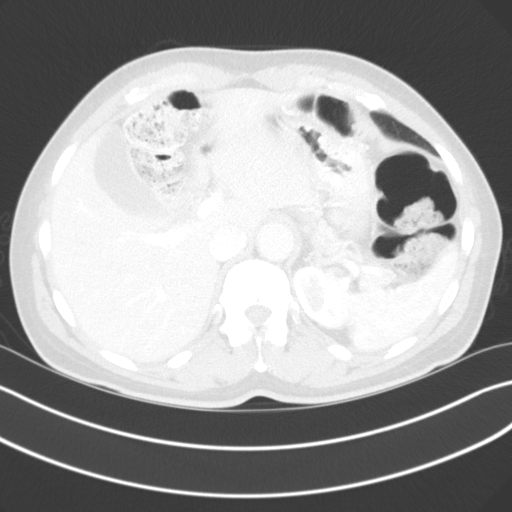
[im 76/82  soft-tissue]
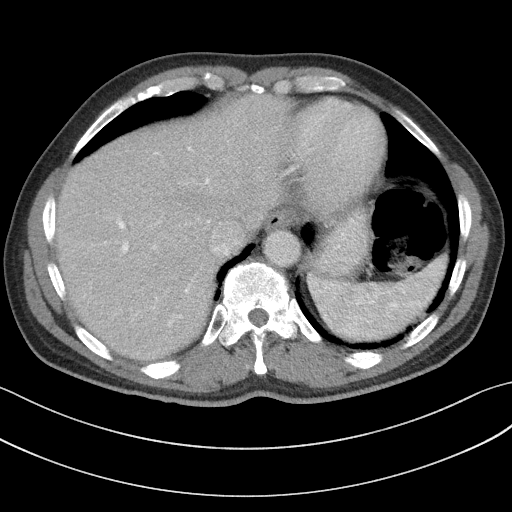
[im 76/82  lung]
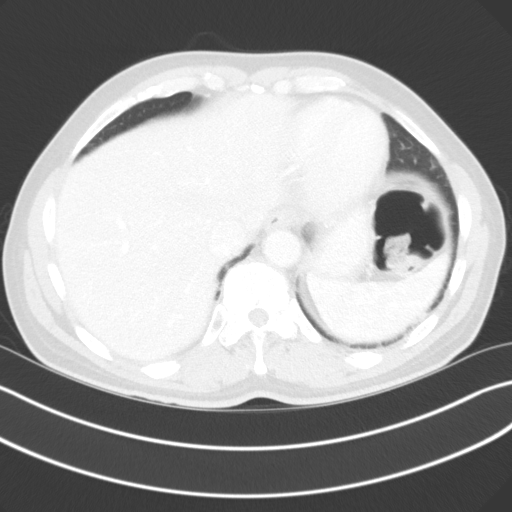

[Series 9: coronal st · coronal · 0.75mm/px · 3 of 89 slices shown]
[im 23/89  soft-tissue]
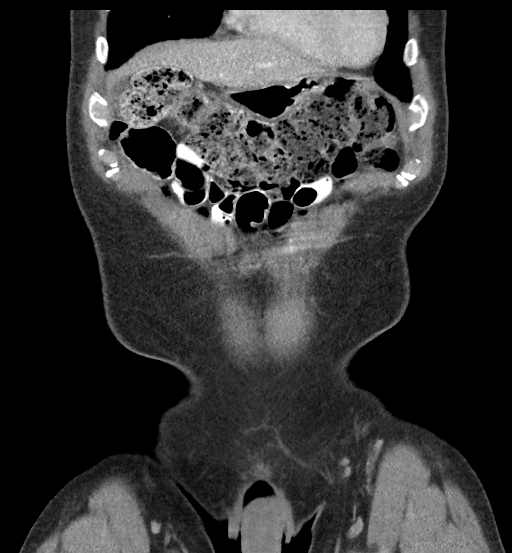
[im 45/89  soft-tissue]
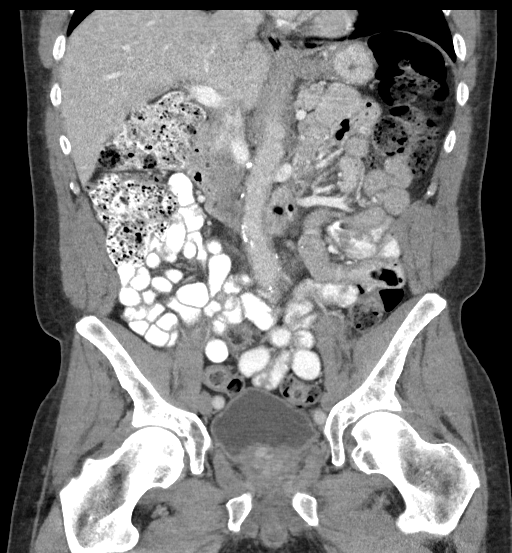
[im 67/89  soft-tissue]
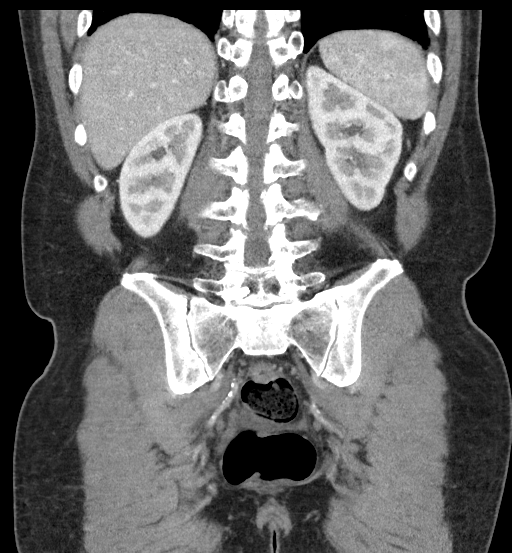

[14 of 46 positions shown; findings below may reference images not displayed]

FINDINGS: Lower chest: Minimal bilateral dependent atelectasis.

Hepatobiliary: Mild diffuse low density of the liver. Normal
appearing gallbladder

Pancreas: Unremarkable. No pancreatic ductal dilatation or
surrounding inflammatory changes.

Spleen: Normal in size without focal abnormality.

Adrenals/Urinary Tract: Normal appearing adrenal glands. Small
bilateral renal cysts. Unremarkable urinary bladder and ureters.

Stomach/Bowel: Prominent stool throughout the colon. Minimal sigmoid
colon diverticulosis. Unremarkable stomach and small bowel. Normal
appearing appendix.

Vascular/Lymphatic: Atheromatous arterial calcifications without
aneurysm. No enlarged lymph nodes.

Reproductive: Moderate heterogeneous enlargement of the prostate
gland.

Other: Small umbilical hernia containing fat.

Musculoskeletal: Lumbar and lower thoracic spine degenerative
changes.
IMPRESSION: 1. No acute abnormality.
2. Mild diffuse hepatic steatosis.
3. Prominent stool throughout the colon.
4. Minimal sigmoid diverticulosis.
5. Moderate prostatic hypertrophy.

## 2018-09-10 DIAGNOSIS — M4306 Spondylolysis, lumbar region: Secondary | ICD-10-CM | POA: Diagnosis not present

## 2018-09-10 DIAGNOSIS — M5106 Intervertebral disc disorders with myelopathy, lumbar region: Secondary | ICD-10-CM | POA: Diagnosis not present

## 2018-11-03 DIAGNOSIS — Z713 Dietary counseling and surveillance: Secondary | ICD-10-CM | POA: Diagnosis not present

## 2018-11-03 DIAGNOSIS — Z299 Encounter for prophylactic measures, unspecified: Secondary | ICD-10-CM | POA: Diagnosis not present

## 2018-11-03 DIAGNOSIS — E7800 Pure hypercholesterolemia, unspecified: Secondary | ICD-10-CM | POA: Diagnosis not present

## 2018-11-03 DIAGNOSIS — I10 Essential (primary) hypertension: Secondary | ICD-10-CM | POA: Diagnosis not present

## 2018-11-03 DIAGNOSIS — Z6825 Body mass index (BMI) 25.0-25.9, adult: Secondary | ICD-10-CM | POA: Diagnosis not present

## 2018-11-24 ENCOUNTER — Telehealth: Payer: Self-pay | Admitting: *Deleted

## 2018-11-24 NOTE — Telephone Encounter (Signed)
Message left,re: follow up visit. 

## 2018-12-01 DIAGNOSIS — I10 Essential (primary) hypertension: Secondary | ICD-10-CM | POA: Diagnosis not present

## 2018-12-01 DIAGNOSIS — Z299 Encounter for prophylactic measures, unspecified: Secondary | ICD-10-CM | POA: Diagnosis not present

## 2018-12-01 DIAGNOSIS — M549 Dorsalgia, unspecified: Secondary | ICD-10-CM | POA: Diagnosis not present

## 2018-12-01 DIAGNOSIS — Z6825 Body mass index (BMI) 25.0-25.9, adult: Secondary | ICD-10-CM | POA: Diagnosis not present

## 2018-12-01 DIAGNOSIS — Z713 Dietary counseling and surveillance: Secondary | ICD-10-CM | POA: Diagnosis not present

## 2019-01-09 ENCOUNTER — Telehealth: Payer: Self-pay | Admitting: *Deleted

## 2019-01-09 NOTE — Telephone Encounter (Signed)
A message was left, re: follow up visit. 

## 2019-01-15 DIAGNOSIS — M4726 Other spondylosis with radiculopathy, lumbar region: Secondary | ICD-10-CM | POA: Diagnosis not present

## 2019-01-15 DIAGNOSIS — Z79891 Long term (current) use of opiate analgesic: Secondary | ICD-10-CM | POA: Diagnosis not present

## 2019-01-15 DIAGNOSIS — M545 Low back pain: Secondary | ICD-10-CM | POA: Diagnosis not present

## 2019-01-15 DIAGNOSIS — M48061 Spinal stenosis, lumbar region without neurogenic claudication: Secondary | ICD-10-CM | POA: Diagnosis not present

## 2019-01-15 DIAGNOSIS — G894 Chronic pain syndrome: Secondary | ICD-10-CM | POA: Diagnosis not present

## 2019-01-15 DIAGNOSIS — M5136 Other intervertebral disc degeneration, lumbar region: Secondary | ICD-10-CM | POA: Diagnosis not present

## 2019-02-09 DIAGNOSIS — M479 Spondylosis, unspecified: Secondary | ICD-10-CM | POA: Diagnosis not present

## 2019-02-09 DIAGNOSIS — E7800 Pure hypercholesterolemia, unspecified: Secondary | ICD-10-CM | POA: Diagnosis not present

## 2019-02-09 DIAGNOSIS — I10 Essential (primary) hypertension: Secondary | ICD-10-CM | POA: Diagnosis not present

## 2019-02-09 DIAGNOSIS — S46009A Unspecified injury of muscle(s) and tendon(s) of the rotator cuff of unspecified shoulder, initial encounter: Secondary | ICD-10-CM | POA: Diagnosis not present

## 2019-02-09 DIAGNOSIS — Z6824 Body mass index (BMI) 24.0-24.9, adult: Secondary | ICD-10-CM | POA: Diagnosis not present

## 2019-02-09 DIAGNOSIS — Z299 Encounter for prophylactic measures, unspecified: Secondary | ICD-10-CM | POA: Diagnosis not present

## 2019-03-19 DIAGNOSIS — Z23 Encounter for immunization: Secondary | ICD-10-CM | POA: Diagnosis not present

## 2019-03-19 DIAGNOSIS — E7800 Pure hypercholesterolemia, unspecified: Secondary | ICD-10-CM | POA: Diagnosis not present

## 2019-03-19 DIAGNOSIS — Z1211 Encounter for screening for malignant neoplasm of colon: Secondary | ICD-10-CM | POA: Diagnosis not present

## 2019-03-19 DIAGNOSIS — Z1331 Encounter for screening for depression: Secondary | ICD-10-CM | POA: Diagnosis not present

## 2019-03-19 DIAGNOSIS — Z0000 Encounter for general adult medical examination without abnormal findings: Secondary | ICD-10-CM | POA: Diagnosis not present

## 2019-03-19 DIAGNOSIS — Z1339 Encounter for screening examination for other mental health and behavioral disorders: Secondary | ICD-10-CM | POA: Diagnosis not present

## 2019-03-19 DIAGNOSIS — Z79899 Other long term (current) drug therapy: Secondary | ICD-10-CM | POA: Diagnosis not present

## 2019-03-19 DIAGNOSIS — I10 Essential (primary) hypertension: Secondary | ICD-10-CM | POA: Diagnosis not present

## 2019-03-19 DIAGNOSIS — Z299 Encounter for prophylactic measures, unspecified: Secondary | ICD-10-CM | POA: Diagnosis not present

## 2019-03-19 DIAGNOSIS — Z7189 Other specified counseling: Secondary | ICD-10-CM | POA: Diagnosis not present

## 2019-03-19 DIAGNOSIS — Z6826 Body mass index (BMI) 26.0-26.9, adult: Secondary | ICD-10-CM | POA: Diagnosis not present

## 2019-03-19 DIAGNOSIS — Z125 Encounter for screening for malignant neoplasm of prostate: Secondary | ICD-10-CM | POA: Diagnosis not present

## 2019-05-05 ENCOUNTER — Other Ambulatory Visit: Payer: Self-pay

## 2019-05-05 ENCOUNTER — Ambulatory Visit: Payer: PPO | Admitting: Cardiovascular Disease

## 2019-05-05 VITALS — BP 127/71 | HR 53 | Temp 98.2°F | Ht 72.0 in | Wt 195.2 lb

## 2019-05-05 DIAGNOSIS — I350 Nonrheumatic aortic (valve) stenosis: Secondary | ICD-10-CM | POA: Diagnosis not present

## 2019-05-05 DIAGNOSIS — I6521 Occlusion and stenosis of right carotid artery: Secondary | ICD-10-CM | POA: Diagnosis not present

## 2019-05-05 DIAGNOSIS — Z79899 Other long term (current) drug therapy: Secondary | ICD-10-CM | POA: Diagnosis not present

## 2019-05-05 DIAGNOSIS — E785 Hyperlipidemia, unspecified: Secondary | ICD-10-CM

## 2019-05-05 DIAGNOSIS — I739 Peripheral vascular disease, unspecified: Secondary | ICD-10-CM | POA: Diagnosis not present

## 2019-05-05 MED ORDER — EZETIMIBE 10 MG PO TABS: 10.0000 mg | ORAL_TABLET | Freq: Every day | ORAL | 3 refills | Status: DC

## 2019-05-05 NOTE — Progress Notes (Signed)
Cardiology Office Note   Date:  05/05/2019   ID:  Charles Ayala, DOB 26-Jan-1951, MRN [id number]  PCP:  Patient, No Pcp Per  Cardiologist: Dr. Domenic Polite  No chief complaint on file.     History of Present Illness: Charles Ayala is a 68 y.o. male who is here today for follow-up visit regarding peripheral arterial disease.  He has known history of mild aortic stenosis, moderate mitral regurgitation,essential hypertension, hyperlipidemia and chronic back pain.  He is not diabetic and does not smoke.  Family history is negative for coronary artery disease.   He is being followed for peripheral arterial disease with mild right calf claudication. Vascular testing in March of 2019 showed moderately reduced ABI on the right and 0.5 range with evidence of severe right SFA stenosis.  No significant disease on the left side. He is also known to have moderate right carotid stenosis. He has been doing reasonably well and denies occasional right calf pain when he goes for his daily walk.  The pain is usually mild and does not force him to stop.  It gets better as he continues to walk.  No chest pain or shortness of breath.   Past Medical History:  Diagnosis Date  . Anemia   . Arthritis   . Heart murmur   . History of stomach ulcers   . Hyperlipidemia   . Hypertension   . Neuropathy     Past Surgical History:  Procedure Laterality Date  . BACK SURGERY  1997   L4 L5  . CARPAL TUNNEL RELEASE Right   . COLONOSCOPY  02/2009   Dr. Laural Golden: few sigmoid diverticula, one at cecum, small submucosal lipoma at prox transverse colon  . COLONOSCOPY WITH PROPOFOL N/A 08/22/2017   Dr. Gala Romney: two tubular adenomas, sigmoid diverticulosis, colonic lipoma  . ESOPHAGOGASTRODUODENOSCOPY (EGD) WITH PROPOFOL N/A 08/22/2017   Dr. Gala Romney: normal esophagus, normal stomach, normal duodenum  . POLYPECTOMY  08/22/2017   Procedure: POLYPECTOMY;  Surgeon: Daneil Dolin, MD;  [facility]: [facility];  Service: Endoscopy;;   sigmoid     Current Outpatient Medications  Medication Sig Dispense Refill  . aspirin EC 81 MG tablet Take 1 tablet (81 mg total) by mouth daily. 90 tablet 3  . gabapentin (NEURONTIN) 300 MG capsule Take 2 pills in the am, 1 pill in the afternoon, and 2 pills in the evening 450 capsule 1  . hydrochlorothiazide (HYDRODIURIL) 25 MG tablet Take 1 tablet (25 mg total) by mouth daily. 90 tablet 1  . simvastatin (ZOCOR) 20 MG tablet Take 1 tablet (20 mg total) by mouth at bedtime. 90 tablet 1  . tamsulosin (FLOMAX) 0.4 MG CAPS capsule Take 1 capsule (0.4 mg total) daily by mouth. 90 capsule 3   No current facility-administered medications for this visit.     Allergies:   Patient has no known allergies.    Social History:  The patient  reports that he has never smoked. He has never used smokeless tobacco. He reports that he does not drink alcohol or use drugs.   Family History:  The patient's family history includes Cancer in his sister; Diabetes in his son; Hypertension in his father and mother; Parkinson's disease in his mother.    ROS:  Please see the history of present illness.   Otherwise, review of systems are positive for none.   All other systems are reviewed and negative.    PHYSICAL EXAM: VS:  BP 127/71   Pulse (!) 53  Temp 98.2 F (36.8 C)   Ht 6' (1.829 m)   Wt 195 lb 3.2 oz (88.5 kg)   SpO2 100%   BMI 26.47 kg/m  , BMI Body mass index is 26.47 kg/m. GEN: Well nourished, well developed, in no acute distress  HEENT: normal  Neck: no JVD, or masses.  Left carotid bruit Cardiac: RRR; no  rubs, or gallops,no edema .  2 / 6 crescendo decrescendo systolic murmur in the aortic area which is early peaking. Respiratory:  clear to auscultation bilaterally, normal work of breathing GI: soft, nontender, nondistended, + BS MS: no deformity or atrophy  Skin: warm and dry, no rash Neuro:  Strength and sensation are intact Psych: euthymic mood, full affect Vascular: Femoral  pulses normal bilaterally.  Distal pulses are normal in the left side and not palpable on the right side   EKG:  EKG is ordered today. EKG showed sinus bradycardia    Recent Labs: No results found for requested labs within last 8760 hours.    Lipid Panel    Component Value Date/Time   CHOL 185 01/03/2018 0917   TRIG 47 01/03/2018 0917   HDL 59 01/03/2018 0917   CHOLHDL 3.1 01/03/2018 0917   LDLCALC 112 (H) 01/03/2018 0917      Wt Readings from Last 3 Encounters:  05/05/19 195 lb 3.2 oz (88.5 kg)  06/04/18 191 lb (86.6 kg)  03/05/18 181 lb (82.1 kg)       PAD Screen 05/07/2017  Previous PAD dx? No  Previous surgical procedure? No  Pain with walking? No  Feet/toe relief with dangling? No  Painful, non-healing ulcers? No  Extremities discolored? No      ASSESSMENT AND PLAN:  1.  Peripheral arterial disease: Significant right SFA stenosis with moderately reduced ABI.  The patient has very mild claudication at the present time that is not lifestyle limiting.  Thus, continue medical therapy.  Continue daily walking.  2. Mild aortic stenosis and moderate mitral regurgitation: Followed by Dr. Domenic Polite.  3.  Essential hypertension: Blood pressure is controlled  4.  Hyperlipidemia: The patient has a history of intolerance to statins including atorvastatin and rosuvastatin but he has been tolerating simvastatin with improvement in LDL.  Most recent LDL was 112.  Given his peripheral arterial disease and carotid disease, I think we need to be more aggressive with his lipid management.  Thus, I elected to add Zetia 10 mg daily.  Repeat lipid and liver profile in 2 months.  5.  Moderate right carotid stenosis: He is due for repeat carotid Doppler which was ordered today   Disposition:   FU with me in 12 months  Signed,   Kathlyn Sacramento, MD  05/05/2019 9:41 AM    [place]

## 2019-05-05 NOTE — Patient Instructions (Signed)
Medication Instructions:  Start taking 10mg  Zetia Daily.  If you need a refill on your cardiac medications before your next appointment, please call your pharmacy.   Lab work: Lipids and Hepatic Function in 2 months.  If you have labs (blood work) drawn today and your tests are completely normal, you will receive your results only by: [place] (if you have MyChart) OR A paper copy in the mail If you have any lab test that is abnormal or we need to change your treatment, we will call you to review the results.  Testing/Procedures: Your physician has requested that you have a carotid duplex at the [facility]. This test is an ultrasound of the carotid arteries in your neck. It looks at blood flow through these arteries that supply the brain with blood. Allow one hour for this exam. There are no restrictions or special instructions.  Follow-Up: At [facility], you and your health needs are our priority.  As part of our continuing mission to provide you with exceptional heart care, we have created designated Provider Care Teams.  These Care Teams include your primary Cardiologist (physician) and Advanced Practice Providers (APPs -  Physician Assistants and Nurse Practitioners) who all work together to provide you with the care you need, when you need it. You may see Dr Fletcher Anon or one of the following Advanced Practice Providers on your designated Care Team:    Rosaria Ferries, PA-C  Jory Sims, DNP, ANP  Cadence Kathlen Mody, NP  Your physician wants you to follow-up in: 1 year. You will receive a reminder letter in the mail two months in advance. If you don't receive a letter, please call our office to schedule the follow-up appointment.

## 2019-05-06 ENCOUNTER — Other Ambulatory Visit: Payer: Self-pay | Admitting: Cardiovascular Disease

## 2019-05-06 DIAGNOSIS — I6521 Occlusion and stenosis of right carotid artery: Secondary | ICD-10-CM

## 2019-05-06 NOTE — Addendum Note (Signed)
Addended by: Vennie Homans on: 05/06/2019 03:50 PM   Modules accepted: Orders

## 2019-05-26 ENCOUNTER — Telehealth: Payer: Self-pay | Admitting: Cardiology

## 2019-05-26 NOTE — Telephone Encounter (Signed)
Virtual Visit Pre-Appointment Phone Call  "(Name), I am calling you today to discuss your upcoming appointment. We are currently trying to limit exposure to the virus that causes COVID-19 by seeing patients at home rather than in the office."  1. "What is the BEST phone number to call the day of the visit?" - include this in appointment notes  2. Do you have or have access to (through a family member/friend) a smartphone with video capability that we can use for your visit?" a. If yes - list this number in appt notes as cell (if different from BEST phone #) and list the appointment type as a VIDEO visit in appointment notes b. If no - list the appointment type as a PHONE visit in appointment notes  3. Confirm consent - "In the setting of the current Covid19 crisis, you are scheduled for a (phone or video) visit with your provider on (date) at (time).  Just as we do with many in-office visits, in order for you to participate in this visit, we must obtain consent.  If you'd like, I can send this to your mychart (if signed up) or email for you to review.  Otherwise, I can obtain your verbal consent now.  All virtual visits are billed to your insurance company just like a normal visit would be.  By agreeing to a virtual visit, we'd like you to understand that the technology does not allow for your provider to perform an examination, and thus may limit your provider's ability to fully assess your condition. If your provider identifies any concerns that need to be evaluated in person, we will make arrangements to do so.  Finally, though the technology is pretty good, we cannot assure that it will always work on either your or our end, and in the setting of a video visit, we may have to convert it to a phone-only visit.  In either situation, we cannot ensure that we have a secure connection.  Are you willing to proceed?" STAFF: Did the patient verbally acknowledge consent to telehealth visit? Document  YES/NO here: yes  4. Advise patient to be prepared - "Two hours prior to your appointment, go ahead and check your blood pressure, pulse, oxygen saturation, and your weight (if you have the equipment to check those) and write them all down. When your visit starts, your provider will ask you for this information. If you have an Apple Watch or Kardia device, please plan to have heart rate information ready on the day of your appointment. Please have a pen and paper handy nearby the day of the visit as well."  5. Give patient instructions for MyChart download to smartphone OR Doximity/Doxy.me as below if video visit (depending on what platform provider is using)  6. Inform patient they will receive a phone call 15 minutes prior to their appointment time (may be from unknown caller ID) so they should be prepared to answer    TELEPHONE CALL NOTE  Charles Ayala has been deemed a candidate for a follow-up tele-health visit to limit community exposure during the Covid-19 pandemic. I spoke with the patient via phone to ensure availability of phone/video source, confirm preferred email & phone number, and discuss instructions and expectations.  I reminded Charles Ayala to be prepared with any vital sign and/or heart rhythm information that could potentially be obtained via home monitoring, at the time of his visit. I reminded Charles Ayala to expect a phone call prior to his visit.  Charles Ayala 05/26/2019 2:21 PM   INSTRUCTIONS FOR DOWNLOADING THE MYCHART APP TO SMARTPHONE  - The patient must first make sure to have activated MyChart and know their login information - If Apple, go to [employer] and type in MyChart in the search bar and download the app. If Android, ask patient to go to [employer] and type in [place] in the search bar and download the app. The app is free but as with any other app downloads, their phone may require them to verify saved payment information or Apple/Android  password.  - The patient will need to then log into the app with their MyChart username and password, and select [facility] as their healthcare provider to link the account. When it is time for your visit, go to the MyChart app, find appointments, and click Begin Video Visit. Be sure to Select Allow for your device to access the Microphone and Camera for your visit. You will then be connected, and your provider will be with you shortly.  **If they have any issues connecting, or need assistance please contact MyChart service desk [phone])**  **If using a computer, in order to ensure the best quality for their visit they will need to use either of the following Internet Browsers: [employer], or Google Chrome**  IF USING DOXIMITY or DOXY.ME - The patient will receive a link just prior to their visit by text.     FULL LENGTH CONSENT FOR TELE-HEALTH VISIT   I hereby voluntarily request, consent and authorize [facility] and its employed or contracted physicians, physician assistants, nurse practitioners or other licensed health care professionals (the Practitioner), to provide me with telemedicine health care services (the Services") as deemed necessary by the treating Practitioner. I acknowledge and consent to receive the Services by the Practitioner via telemedicine. I understand that the telemedicine visit will involve communicating with the Practitioner through live audiovisual communication technology and the disclosure of certain medical information by electronic transmission. I acknowledge that I have been given the opportunity to request an in-person assessment or other available alternative prior to the telemedicine visit and am voluntarily participating in the telemedicine visit.  I understand that I have the right to withhold or withdraw my consent to the use of telemedicine in the course of my care at any time, without affecting my right to future care or treatment,  and that the Practitioner or I may terminate the telemedicine visit at any time. I understand that I have the right to inspect all information obtained and/or recorded in the course of the telemedicine visit and may receive copies of available information for a reasonable fee.  I understand that some of the potential risks of receiving the Services via telemedicine include:   Delay or interruption in medical evaluation due to technological equipment failure or disruption;  Information transmitted may not be sufficient (e.g. poor resolution of images) to allow for appropriate medical decision making by the Practitioner; and/or   In rare instances, security protocols could fail, causing a breach of personal health information.  Furthermore, I acknowledge that it is my responsibility to provide information about my medical history, conditions and care that is complete and accurate to the best of my ability. I acknowledge that Practitioner's advice, recommendations, and/or decision may be based on factors not within their control, such as incomplete or inaccurate data provided by me or distortions of diagnostic images or specimens that may result from electronic transmissions. I understand that the  practice of medicine is not an Chief Strategy Officer and that Practitioner makes no warranties or guarantees regarding treatment outcomes. I acknowledge that I will receive a copy of this consent concurrently upon execution via email to the email address I last provided but may also request a printed copy by calling the office of [facility].    I understand that my insurance will be billed for this visit.   I have read or had this consent read to me.  I understand the contents of this consent, which adequately explains the benefits and risks of the Services being provided via telemedicine.   I have been provided ample opportunity to ask questions regarding this consent and the Services and have had my questions  answered to my satisfaction.  I give my informed consent for the services to be provided through the use of telemedicine in my medical care  By participating in this telemedicine visit I agree to the above.

## 2019-05-27 ENCOUNTER — Encounter: Payer: Self-pay | Admitting: Cardiology

## 2019-05-27 ENCOUNTER — Telehealth (INDEPENDENT_AMBULATORY_CARE_PROVIDER_SITE_OTHER): Payer: PPO | Admitting: Family Medicine

## 2019-05-27 VITALS — BP 120/80 | Ht 72.0 in | Wt 193.0 lb

## 2019-05-27 DIAGNOSIS — I350 Nonrheumatic aortic (valve) stenosis: Secondary | ICD-10-CM | POA: Diagnosis not present

## 2019-05-27 DIAGNOSIS — I6521 Occlusion and stenosis of right carotid artery: Secondary | ICD-10-CM

## 2019-05-27 DIAGNOSIS — I739 Peripheral vascular disease, unspecified: Secondary | ICD-10-CM

## 2019-05-27 DIAGNOSIS — I10 Essential (primary) hypertension: Secondary | ICD-10-CM

## 2019-05-27 DIAGNOSIS — E782 Mixed hyperlipidemia: Secondary | ICD-10-CM

## 2019-05-27 DIAGNOSIS — I6523 Occlusion and stenosis of bilateral carotid arteries: Secondary | ICD-10-CM | POA: Diagnosis not present

## 2019-05-27 NOTE — Patient Instructions (Signed)

## 2019-05-27 NOTE — Progress Notes (Addendum)
Virtual Visit via Telephone Note   This visit type was conducted due to national recommendations for restrictions regarding the COVID-19 Pandemic (e.g. social distancing) in an effort to limit this patient's exposure and mitigate transmission in our community.  Due to his co-morbid illnesses, this patient is at least at moderate risk for complications without adequate follow up.  This format is felt to be most appropriate for this patient at this time.  The patient did not have access to video technology/had technical difficulties with video requiring transitioning to audio format only (telephone).  All issues noted in this document were discussed and addressed.  No physical exam could be performed with this format.  Please refer to the patient's chart for his  consent to telehealth for [facility].   Date:  05/27/2019   ID:  Charles Ayala, DOB February 05, 1951, MRN [id number]  Patient Location: Home Provider Location: Office  PCP:  Patient, No Pcp Per    Cardiologist:  Rozann Lesches, MD  Electrophysiologist:  None   Evaluation Performed:  Follow-Up Visit  Chief Complaint: 1 year follow-up for hypertension, mild aortic stenosis moderate MR, hyperlipidemia, PAD, carotid artery disease.  History of Present Illness:    Charles Ayala is a 68 y.o. male last encounter December 2019 with Dr. Domenic Polite.  Has a history of valvular disease with mild aortic stenosis, mild mitral prolapse, and moderate mitral regurgitation on a previous echo.  Recent echo in December was unchanged.  Patient has a history of PAD and has seen Dr. Fletcher Anon with moderate to severe disease on the right.   Admits to mild claudication and right calf when ambulating which is transient and resolves quickly.  Has moderate carotid artery disease with 40 to 59% stenosis in right and 1 to 39% in left.  Both vertebrals demonstrated antegrade flow on recent carotid artery duplex study September 2019.  He has an upcoming repeat  carotid study.  History of hyperlipidemia with last LDL result of 112 in July 2019.  Patient is taking simvastatin 20 mg daily .Zetia 10 mg was recently added daily.  Blood pressure was elevated at last visit at 154/67.  Blood pressure within normal limits today.  He is taking hydrochlorothiazide 25 mg daily.  The patient does not have symptoms concerning for COVID-19 infection (fever, chills, cough, or new shortness of breath).    Past Medical History:  Diagnosis Date  . Anemia   . Arthritis   . Heart murmur   . History of stomach ulcers   . Hyperlipidemia   . Hypertension   . Neuropathy    Past Surgical History:  Procedure Laterality Date  . BACK SURGERY  1997   L4 L5  . CARPAL TUNNEL RELEASE Right   . COLONOSCOPY  02/2009   Dr. Laural Golden: few sigmoid diverticula, one at cecum, small submucosal lipoma at prox transverse colon  . COLONOSCOPY WITH PROPOFOL N/A 08/22/2017   Dr. Gala Romney: two tubular adenomas, sigmoid diverticulosis, colonic lipoma  . ESOPHAGOGASTRODUODENOSCOPY (EGD) WITH PROPOFOL N/A 08/22/2017   Dr. Gala Romney: normal esophagus, normal stomach, normal duodenum  . POLYPECTOMY  08/22/2017   Procedure: POLYPECTOMY;  Surgeon: Daneil Dolin, MD;  Location: [facility];  Service: Endoscopy;;  sigmoid     Current Meds  Medication Sig  . aspirin EC 81 MG tablet Take 1 tablet (81 mg total) by mouth daily.  Marland Kitchen ezetimibe (ZETIA) 10 MG tablet Take 1 tablet (10 mg total) by mouth daily.  Marland Kitchen gabapentin (NEURONTIN) 300 MG capsule Take  2 pills in the am, 1 pill in the afternoon, and 2 pills in the evening  . hydrochlorothiazide (HYDRODIURIL) 25 MG tablet Take 1 tablet (25 mg total) by mouth daily.  . naproxen sodium (ALEVE) 220 MG tablet Take 440 mg by mouth 2 (two) times daily as needed.  . simvastatin (ZOCOR) 20 MG tablet Take 1 tablet (20 mg total) by mouth at bedtime.  . tamsulosin (FLOMAX) 0.4 MG CAPS capsule Take 1 capsule (0.4 mg total) daily by mouth.  . [DISCONTINUED] naproxen  sodium (ALEVE) 220 MG tablet Take 220 mg by mouth.     Allergies:   Patient has no known allergies.   Social History   Tobacco Use  . Smoking status: Never Smoker  . Smokeless tobacco: Never Used  Substance Use Topics  . Alcohol use: No  . Drug use: No     Family Hx: The patient's family history includes Cancer in his sister; Diabetes in his son; Hypertension in his father and mother; Parkinson's disease in his mother. There is no history of Colon cancer, Liver disease, or Pancreatic cancer.  ROS:   Please see the history of present illness.    All other systems reviewed and are negative.   Prior CV studies:   The following studies were reviewed today:  Carotid Dopplers 03/12/2018: Final Interpretation:  Right Carotid: Velocities in the right ICA are consistent with a 40-59%         stenosis.    Left Carotid: Velocities in the left ICA are consistent with a 1-39% stenosis.    Vertebrals: Bilateral vertebral arteries demonstrate antegrade flow.  Subclavians: Normal flow hemodynamics were seen in bilateral subclavian        arteries.  Echocardiogram dated June 17, 2018 Study Conclusions  - Left ventricle: The cavity size was normal. Wall thickness was   increased in a pattern of mild LVH. Systolic function was normal.   The estimated ejection fraction was in the range of 60% to 65%.   Wall motion was normal; there were no regional wall motion   abnormalities. Findings consistent with left ventricular   diastolic dysfunction, grade indeterminate. - Aortic valve: Trileaflet; mildly thickened, mildly calcified   leaflets. There was mild stenosis. Valve area (VTI): 1.64 cm^2.   Valve area (Vmax): 1.59 cm^2. Valve area (Vmean): 1.59 cm^2. - Aorta: Aortic root dimension: 37 mm (ED). - Mitral valve: Mildly calcified annulus. Mildly thickened leaflets   . There was moderate regurgitation. Mild posterior leaflet   prolapse. - Left atrium: The atrium was  moderately dilated. - Tricuspid valve: There was mild regurgitation. - Pulmonic valve: There was mild regurgitation.  Labs/Other Tests and Data Reviewed:    EKG:  An ECG dated 05/08/2019 was personally reviewed today and demonstrated:  Sinus bradycardia rate of 53  Recent Labs: No results found for requested labs within last 8760 hours.   Recent Lipid Panel Lab Results  Component Value Date/Time   CHOL 185 01/03/2018 09:17 AM   TRIG 47 01/03/2018 09:17 AM   HDL 59 01/03/2018 09:17 AM   CHOLHDL 3.1 01/03/2018 09:17 AM   LDLCALC 112 (H) 01/03/2018 09:17 AM    Wt Readings from Last 3 Encounters:  05/27/19 193 lb (87.5 kg)  05/05/19 195 lb 3.2 oz (88.5 kg)  06/04/18 191 lb (86.6 kg)     Objective:    Vital Signs:  BP 120/80   Ht 6' (1.829 m)   Wt 193 lb (87.5 kg)   BMI 26.18 kg/m  VITAL SIGNS:  reviewed   Patient speaking in complete sentences with normal speech pattern.  No evidence of cough, wheezing, or dyspnea.  ASSESSMENT & PLAN:     1. Essential hypertension Patient takes hydrochlorothiazide 25 mg daily.  Blood pressure 120/80 today. Continue HCTZ .   2. Mixed hyperlipidemia Taking simvastatin 20 mg and Zetia 10 mg daily. Last LDL was 112.  Has pending fasting lipid profile.    3. Mild aortic stenosis Noted on recent echocardiogram in December 2019 unchanged from previous echocardiogram.  Patient denies any anginal symptoms, syncopal or near syncopal symptoms, or dyspnea.  4. PAD (peripheral artery disease) ([place])  He is being followed by Dr. Fletcher Anon for peripheral arterial disease with mild right calf claudication. Vascular testing in March of 2019 showed moderately reduced ABI on the right and 0.5 range with evidence of severe right SFA stenosis.  States she has some mild claudication and right calf on moderate exertion which resolves quickly.  5.  Carotid artery disease.  Recent carotid artery study in September 2019 showed moderate carotid artery disease in  the right ICA with 40 to 59% stenosis.  Mild carotid artery disease in left ICA with 1 to 39% stenosis.  He has a repeat study pending  COVID-19 Education: The signs and symptoms of COVID-19 were discussed with the patient and how to seek care for testing (follow up with PCP or arrange E-visit).  The importance of social distancing was discussed today.  Time:   Today, I have spent 15  minutes with the patient with telehealth technology discussing the above problems.     Medication Adjustments/Labs and Tests Ordered: Current medicines are reviewed at length with the patient today.  Concerns regarding medicines are outlined above.   Tests Ordered: No orders of the defined types were placed in this encounter.   Medication Changes: No orders of the defined types were placed in this encounter.   Follow Up:  Either In Person or Virtual in 1 year(s)  Signed, Verta Ellen, NP  05/27/2019 9:47 AM    [place]

## 2019-06-04 ENCOUNTER — Other Ambulatory Visit: Payer: Self-pay

## 2019-06-04 ENCOUNTER — Ambulatory Visit (INDEPENDENT_AMBULATORY_CARE_PROVIDER_SITE_OTHER): Payer: PPO

## 2019-06-04 DIAGNOSIS — I6521 Occlusion and stenosis of right carotid artery: Secondary | ICD-10-CM | POA: Diagnosis not present

## 2019-06-10 ENCOUNTER — Telehealth: Payer: Self-pay | Admitting: *Deleted

## 2019-06-10 NOTE — Telephone Encounter (Signed)
Left a message for the patient to call back.  

## 2019-06-10 NOTE — Telephone Encounter (Signed)
-----   Message from Wellington Hampshire, MD sent at 06/10/2019  9:19 AM EST ----- Mild bilateral carotid disease seems slightly better on the right side.

## 2019-06-11 NOTE — Telephone Encounter (Signed)
Patient made aware of results and verbalized understanding.  

## 2019-08-12 ENCOUNTER — Encounter: Payer: Self-pay | Admitting: Family Medicine

## 2019-08-12 ENCOUNTER — Ambulatory Visit (INDEPENDENT_AMBULATORY_CARE_PROVIDER_SITE_OTHER): Payer: PPO | Admitting: Family Medicine

## 2019-08-12 ENCOUNTER — Other Ambulatory Visit: Payer: Self-pay

## 2019-08-12 VITALS — BP 130/74 | HR 57 | Temp 97.4°F | Resp 15 | Ht 72.0 in | Wt 194.0 lb

## 2019-08-12 DIAGNOSIS — M5106 Intervertebral disc disorders with myelopathy, lumbar region: Secondary | ICD-10-CM | POA: Diagnosis not present

## 2019-08-12 DIAGNOSIS — N400 Enlarged prostate without lower urinary tract symptoms: Secondary | ICD-10-CM | POA: Diagnosis not present

## 2019-08-12 DIAGNOSIS — I10 Essential (primary) hypertension: Secondary | ICD-10-CM

## 2019-08-12 DIAGNOSIS — E785 Hyperlipidemia, unspecified: Secondary | ICD-10-CM | POA: Diagnosis not present

## 2019-08-12 DIAGNOSIS — D126 Benign neoplasm of colon, unspecified: Secondary | ICD-10-CM

## 2019-08-12 DIAGNOSIS — M659 Synovitis and tenosynovitis, unspecified: Secondary | ICD-10-CM

## 2019-08-12 MED ORDER — UNABLE TO FIND: 0 refills | Status: DC

## 2019-08-12 NOTE — Progress Notes (Signed)
Subjective:  Patient ID: Charles Ayala, male    DOB: [birth year]  Age: 69 y.o. MRN: [id number]  CC:  Chief Complaint  Patient presents with  . New Patient (Initial Visit)    establish care, has a tingling feeling in right hand, started yesterday, wearing brace, the more he uses hand the worse the feeling gets      HPI  HPI  Charles Ayala is a 69 year old male patient who presents today with his wife in the same room to establish care.  Previously was a patient of Dr. Roma Kayser.  Most recently was being taken care of by Dr. Manuella Ghazi.   He has an extensive history including arthritis, heart murmur, stomach ulcers, hyperlipidemia, hypertension, neuropathy, anemia.  Family history includes a sister having cancer, son having diabetes, hypertension in mother and father and mother had Parkinson's.  He denies any use of tobacco, alcohol or illicit drugs.  Lives with his wife who is also present here today to establish care.  They have 4 children.  And many grandchildren and great-grandchildren.  He enjoys gardening and playing with his grandchildren.  He reports that they eat a lot of veggies and limited meat.  Mostly chicken fish when they do eat those.  Not as much on fruits but definitely does get his vegetables in.  Drinks 2 cups of coffee a day.  And 6 to 8 cups of water easily per him.  His biggest concern today is that he feels like he has right thumb and wrist discomfort described as pins-and-needles.  He has had carpal tunnel fascial release previously in the past.  He reports that he was wearing a brace but has not helped much.  He reports that it is sometimes hard to pick up things.  Has never been diagnosed with tendinitis that he is aware of.  He denies having any injury to the area or repetitive use.   Additionally he would like referral to pain management as he has ongoing chronic back pain that he is tried therapy for and he just does not work.  He is open to going to anywhere that is  in the close vicinity of 15 to 20 miles at most.   Today patient denies signs and symptoms of COVID 19 infection including fever, chills, cough, shortness of breath, and headache. Past Medical, Surgical, Social History, Allergies, and Medications have been Reviewed.   Past Medical History:  Diagnosis Date  . Anemia   . Arthritis   . Encounter for hepatitis C screening test for low risk patient 07/31/2017  . Heart murmur   . High risk medication use 07/31/2017  . History of stomach ulcers   . Hyperlipidemia   . Hypertension   . Nausea without vomiting 07/22/2017  . Neuropathy     Current Meds  Medication Sig  . ezetimibe (ZETIA) 10 MG tablet Take 1 tablet (10 mg total) by mouth daily.  Marland Kitchen gabapentin (NEURONTIN) 300 MG capsule Take 2 pills in the am, 1 pill in the afternoon, and 2 pills in the evening  . hydrochlorothiazide (HYDRODIURIL) 25 MG tablet Take 1 tablet (25 mg total) by mouth daily.  . naproxen sodium (ALEVE) 220 MG tablet Take 440 mg by mouth 2 (two) times daily as needed.  . simvastatin (ZOCOR) 20 MG tablet Take 1 tablet (20 mg total) by mouth at bedtime.  . tamsulosin (FLOMAX) 0.4 MG CAPS capsule Take 1 capsule (0.4 mg total) daily by mouth.    ROS:  Review  of Systems  Constitutional: Negative.   HENT: Negative.   Eyes: Negative.   Respiratory: Negative.   Cardiovascular: Negative.   Gastrointestinal: Negative.   Genitourinary: Negative.   Musculoskeletal: Positive for joint pain.  Skin: Negative.   Neurological: Negative.   Endo/Heme/Allergies: Negative.   Psychiatric/Behavioral: Negative.   All other systems reviewed and are negative.    Objective:   Today's Vitals: BP 130/74   Pulse (!) 57   Temp (!) 97.4 F (36.3 C) (Temporal)   Resp 15   Ht 6' (1.829 m)   Wt 194 lb 0.6 oz (88 kg)   SpO2 99%   BMI 26.32 kg/m  Vitals with BMI 08/12/2019 05/27/2019 05/05/2019  Height 6\' 0"  6\' 0"  6\' 0"   Weight 194 lbs 1 oz 193 lbs 195 lbs 3 oz  BMI 26.31 [id number]  123456  Systolic [id number]  Diastolic 74 80 71  Pulse 57 - 53     Physical Exam Vitals and nursing note reviewed.  Constitutional:      Appearance: Normal appearance. He is well-developed, well-groomed and overweight.  HENT:     Head: Normocephalic and atraumatic.     Right Ear: External ear normal.     Left Ear: External ear normal.     Mouth/Throat:     Comments: Mask in place Eyes:     General:        Right eye: No discharge.        Left eye: No discharge.     Conjunctiva/sclera: Conjunctivae normal.  Cardiovascular:     Rate and Rhythm: Normal rate and regular rhythm.     Pulses: Normal pulses.     Heart sounds: Normal heart sounds.  Pulmonary:     Effort: Pulmonary effort is normal.     Breath sounds: Normal breath sounds.  Musculoskeletal:        General: Normal range of motion.     Right wrist: Tenderness present.     Left wrist: Normal.     Cervical back: Normal range of motion and neck supple.  Skin:    General: Skin is warm.  Neurological:     General: No focal deficit present.     Mental Status: He is alert and oriented to person, place, and time.  Psychiatric:        Attention and Perception: Attention normal.        Mood and Affect: Mood normal.        Speech: Speech normal.        Behavior: Behavior normal. Behavior is cooperative.        Thought Content: Thought content normal.        Cognition and Memory: Cognition normal.        Judgment: Judgment normal.     Assessment   1. Essential hypertension   2. Tubular adenoma of colon   3. Intervertebral disc disorders with myelopathy of lumbar region   4. Benign prostatic hyperplasia without lower urinary tract symptoms   5. Hyperlipidemia LDL goal <100   6. Tenosynovitis of thumb     Tests ordered Orders Placed This Encounter  Procedures  . Ambulatory referral to Pain Clinic     Plan: Please see assessment and plan per problem list above.   Meds ordered this encounter  Medications  .  UNABLE TO FIND    Sig: Spica-thumb/ tenosynovitis brace    Dispense:  1 Device    Refill:  0    Order Specific Question:  Supervising Provider    Answer:   Fayrene Helper [id number]    Patient to follow-up in 2 weeks  Perlie Mayo, NP

## 2019-08-12 NOTE — Patient Instructions (Addendum)
I appreciate the opportunity to provide you with care for your health and wellness. Today we discussed: establish care   Follow up: 2-4 weeks for wrist assessment  No labs   Referral to Pain management  Get brace for thumb and wrist imobilization  Please continue to practice social distancing to keep you, your family, and our community safe.  If you must go out, please wear a mask and practice good handwashing.  It was a pleasure to see you and I look forward to continuing to work together on your health and well-being. Please do not hesitate to call the office if you need care or have questions about your care.  Have a wonderful day and week. With Gratitude, [name], DNP, AGNP-BC   Charles Ayala Tenosynovitis  De Quervain's tenosynovitis is a condition that causes inflammation of the tendon on the thumb side of the wrist. Tendons are cords of tissue that connect bones to muscles. The tendons in the hand pass through a tunnel called a sheath. A slippery layer of tissue (synovium) lets the tendons move smoothly in the sheath. With de Quervain's tenosynovitis, the sheath swells or thickens, causing friction and pain. The condition is also called de Quervain's disease and de Quervain's syndrome. What are the causes? The exact cause of this condition is not known. It may be associated with overuse of the hand and wrist. What increases the risk? You are more likely to develop this condition if you:  Use your hands far more than normal, especially if you repeat certain movements that involve twisting your hand or using a tight grip.  Are pregnant.  Are a middle-aged woman.  Have rheumatoid arthritis.  Have diabetes. What are the signs or symptoms? The main symptom of this condition is pain on the thumb side of the wrist. The pain may get worse when you grasp something or turn your wrist. Other symptoms may include:  Pain that extends up the forearm.  Swelling of your wrist  and hand.  Trouble moving the thumb and wrist.  A sensation of snapping in the wrist.  A bump filled with fluid (cyst) in the area of the pain. How is this diagnosed? This condition may be diagnosed based on:  Your symptoms and medical history.  A physical exam. During the exam, your health care provider may do a simple test Wynn Maudlin test) that involves pulling your thumb and wrist to see if this causes pain. You may also need to have an X-ray. How is this treated? Treatment for this condition may include:  Avoiding any activity that causes pain and swelling.  Taking medicines. Anti-inflammatory medicines and corticosteroid injections may be used to reduce inflammation and relieve pain.  Wearing a splint.  Having surgery. This may be needed if other treatments do not work. Once the pain and swelling has gone down:  Physical therapy. This includes stretching and strengthening exercises.  Occupational therapy. This includes adjusting how you move your wrist. Follow these instructions at home: If you have a splint:  Wear the splint as told by your health care provider. Remove it only as told by your health care provider.  Loosen the splint if your fingers tingle, become numb, or turn cold and blue.  Keep the splint clean.  If the splint is not waterproof: ? Do not let it get wet. ? Cover it with a watertight covering when you take a bath or a shower. Managing pain, stiffness, and swelling   Avoid movements and activities  that cause pain and swelling in the wrist area.  If directed, put ice on the painful area. This may be helpful after doing activities that involve the sore wrist. ? Put ice in a plastic bag. ? Place a towel between your skin and the bag. ? Leave the ice on for 20 minutes, 2-3 times a day.  Move your fingers often to avoid stiffness and to lessen swelling.  Raise (elevate) the injured area above the level of your heart while you are sitting or  lying down. General instructions  Return to your normal activities as told by your health care provider. Ask your health care provider what activities are safe for you.  Take over-the-counter and prescription medicines only as told by your health care provider.  Keep all follow-up visits as told by your health care provider. This is important. Contact a health care provider if:  Your pain medicine does not help.  Your pain gets worse.  You develop new symptoms. Summary  De Quervain's tenosynovitis is a condition that causes inflammation of the tendon on the thumb side of the wrist.  The exact cause of this condition is not known. It may be associated with overuse of the hand and wrist.  Treatment starts with avoiding activity that causes pain or swelling in the wrist area. Other treatment may include wearing a splint and taking medicine. Sometimes, surgery is needed. This information is not intended to replace advice given to you by your health care provider. Make sure you discuss any questions you have with your health care provider. Document Revised: 12/05/2017 Document Reviewed: 05/13/2017 Elsevier Patient Education  [place].  Wrist and Forearm Exercises Ask your health care provider which exercises are safe for you. Do exercises exactly as told by your health care provider and adjust them as directed. It is normal to feel mild stretching, pulling, tightness, or discomfort as you do these exercises. Stop right away if you feel sudden pain or your pain gets worse. Do not begin these exercises until told by your health care provider. Range-of-motion exercises These exercises warm up your muscles and joints and improve the movement and flexibility of your injured wrist and forearm. These exercises also help to relieve pain, numbness, and tingling. These exercises are done using the muscles in your injured wrist and forearm. Wrist flexion 1. Bend your left / right elbow to a  90-degree angle (right angle) with your palm facing the floor. 2. Bend your wrist so that your fingers point toward the floor (flexion). 3. Hold this position for _5-10_________ seconds. 4. Slowly return to the starting position. Repeat ___2_______ times. Complete this exercise __3________ times a day. Wrist extension 1. Bend your left / right elbow to a 90-degree angle (right angle) with your palm facing the floor. 2. Bend your wrist so that your fingers point toward the ceiling (extension). 3. Hold this position for __5-10________ seconds. 4. Slowly return to the starting position. Repeat ______2____ times. Complete this exercise __3________ times a day. Ulnar deviation 1. Bend your left / right elbow to a 90-degree angle (right angle), and rest your forearm on a table with your palm facing down. 2. Keeping your hand flat on the table, bend your left /right wrist toward your small finger (pinkie). This is ulnar deviation. 3. Hold this position for ____5-10______ seconds. 4. Slowly return to the starting position. Repeat ___2_______ times. Complete this exercise ____3______ times a day. Radial deviation 1. Bend your left / right elbow to a  90-degree angle (right angle), and rest your forearm on a table with your palm facing down. 2. Keeping your hand flat on the table, bend your left /right wrist toward your thumb. This is radial deviation. 3. Hold this position for ____5-10______ seconds. 4. Slowly return to the starting position. Repeat __2________ times. Complete this exercise ____3______ times a day. Forearm rotation, supination  1. Sit with your left / right elbow bent to a 90-degree angle (right angle). Position your forearm so that the thumb is facing the ceiling (neutral position). 2. Turn (rotate) your palm up toward the ceiling (supination), stopping when you feel a gentle stretch. 3. Hold this position for ____5-10______ seconds. 4. Slowly return to the starting  position. Repeat _____2_____ times. Complete this exercise _3_________ times a day. Forearm rotation, pronation  1. Sit with your left / right elbow bent to a 90-degree angle (right angle). Position your forearm so that the thumb is facing the ceiling (neutral position). 2. Rotate your palm down toward the floor (pronation), stopping when you feel a gentle stretch. 3. Hold this position for ____5-10______ seconds. 4. Slowly return to the starting position. Repeat _____2_____ times. Complete this exercise _____3_____ times a day. Stretching These exercises warm up your muscles and joints and improve the movement and flexibility of your injured wrist and forearm. These exercises also help to relieve pain, numbness, and tingling. These exercises are done using your healthy wrist and forearm to help stretch the muscles in your injured wrist and forearm. Wrist flexion  1. Extend your left / right arm in front of you, and turn your palm down toward the floor. ? If told by your health care provider, bend your left / right elbow to a 90-degree angle (right angle) at your side. 2. Using your uninjured hand, gently press over the back of your left / right hand to bend your wrist and fingers toward the floor (flexion). Go as far as you can to feel a stretch without causing pain. 3. Hold this position for _5-10_________ seconds. 4. Slowly return to the starting position. Repeat ____2______ times. Complete this exercise ___3_______ times a day. Wrist extension  1. Extend your left / right arm in front of you and turn your palm up toward the ceiling. ? If told by your health care provider, bend your left / right elbow to a 90-degree angle (right angle) at your side. 2. Using your uninjured hand, gently press over the palm of your left / right hand to bend your wrist and fingers toward the floor (extension). Go as far as you can to feel a stretch without causing pain. 3. Hold this position for  ___5-10_______ seconds. 4. Slowly return to the starting position. Repeat ___2_______ times. Complete this exercise __3________ times a day. Forearm rotation, supination 1. Sit with your left / right elbow bent to a 90-degree angle (right angle). Position your forearm so that the thumb is facing the ceiling (neutral position). 2. Rotate your palm up toward the ceiling as far as you can on your own (supination). Then, use your uninjured hand to help turn your forearm more, stopping when you feel a gentle stretch. 3. Hold this position for _____5-10_____ seconds. 4. Slowly return to the starting position. Repeat ___2_______ times. Complete this exercise ___3_______ times a day. Forearm rotation, pronation 1. Sit with your left / right elbow bent to a 90-degree angle (right angle). Position your forearm so that the thumb is facing the ceiling (neutral position). 2. Rotate your palm  down toward the floor as far as you can on your own (pronation). Then, use your uninjured hand to help turn your forearm more, stopping when you feel a gentle stretch. 3. Hold this position for ____5-10______ seconds. 4. Slowly return to the starting position. Repeat _____2_____ times. Complete this exercise __3________ times a day. Strengthening exercises These exercises build strength and endurance in your wrist and forearm. Endurance is the ability to use your muscles for a long time, even after they get tired. Wrist flexion  1. Sit with your left / right forearm supported on a table or other surface. Bend your elbow to a 90-degree angle (right angle), and rest your hand palm-up over the edge of the table. 2. Hold a __0_-1______ weight in your left / right hand. Or, hold an exercise band or tube in both hands, keeping your hands at the same level and hip distance apart. There should be a slight tension in the exercise band or tube. 3. Slowly curl your hand up toward the ceiling (flexion). 4. Hold this position for  ____5-10______ seconds. 5. Slowly lower your hand back to the starting position. Repeat ____2______ times. Complete this exercise __3________ times a day. Wrist extension  1. Sit with your left / right forearm supported on a table or other surface. Bend your elbow to a 90-degree angle (right angle), and rest your hand palm-down over the edge of the table. 2. Hold a ___0__-1_____ weight in your left / right hand. Or, hold an exercise band or tube in both hands, keeping your hands at the same level and hip distance apart. There should be a slight tension in the exercise band or tube. 3. Slowly curl your hand up toward the ceiling (extension). 4. Hold this position for ____5-10______ seconds. 5. Slowly lower your hand back to the starting position. Repeat _____2_____ times. Complete this exercise ___3_______ times a day. Forearm rotation, supination  1. Sit with your left / right forearm supported on a table or other surface. Bend your elbow to a 90-degree angle (right angle). Position your forearm so that your thumb is facing the ceiling (neutral position) and your hand is resting over the edge of the table. 2. Hold a hammer in your left / right hand. ? This exercise will be easier if you hold the hammer near the head of the hammer. ? This exercise will be harder if you hold the hammer near the end of the handle. 3. Without moving your elbow, slowly rotate your palm up toward the ceiling (supination). 4. Hold this position for ____5-10______ seconds. 5. Slowly return to the starting position. Repeat _____2_____ times. Complete this exercise ____3______ times a day. Forearm rotation, pronation3  1. Sit with your left / right forearm supported on a table or other surface. Bend your elbow to a 90-degree angle (right angle). Position your forearm so that the thumb is facing the ceiling (neutral position), with your hand resting over the edge of the table. 2. Hold a hammer in your left / right  hand. ? This exercise will be easier if you hold the hammer near the head of the hammer. ? This exercise will be harder if you hold the hammer near the end of the handle. 3. Without moving your elbow, slowly rotate your palm down toward the floor (pronation). 4. Hold this position for ___5-10_______ seconds. 5. Slowly return to the starting position. Repeat ____2______ times. Complete this exercise ___3_______ times a day. Grip strengthening  1. Grasp a stress ball  or other ball in the middle of your left / right hand. Start with your elbow bent to a 90-degree angle (right angle). 2. Slowly increase the pressure, squeezing the ball as hard as you can without causing pain. ? Think of bringing the tips of your fingers into the middle of your palm. All of your finger joints should bend when doing this exercise. ? To make this exercise harder, gradually try to straighten your elbow in front of you, until you can do the exercise with your elbow fully straight. 3. Hold your squeeze for ___10_______ seconds, then relax. If instructed by your health care provider, do this exercise: ? With your forearm positioned so that the thumb is facing the ceiling (neutral position). ? With your forearm turned palm down. ? With your forearm turned palm up. Repeat ___2_______ times. Complete this exercise _____3_____ times a day. This information is not intended to replace advice given to you by your health care provider. Make sure you discuss any questions you have with your health care provider. Document Revised: 07/24/2018 Document Reviewed: 07/24/2018 [employer] Patient Education  [employer].

## 2019-08-16 ENCOUNTER — Encounter: Payer: Self-pay | Admitting: Family Medicine

## 2019-08-16 DIAGNOSIS — E7800 Pure hypercholesterolemia, unspecified: Secondary | ICD-10-CM | POA: Diagnosis not present

## 2019-08-16 DIAGNOSIS — M659 Synovitis and tenosynovitis, unspecified: Secondary | ICD-10-CM | POA: Insufficient documentation

## 2019-08-16 DIAGNOSIS — I739 Peripheral vascular disease, unspecified: Secondary | ICD-10-CM | POA: Diagnosis not present

## 2019-08-16 DIAGNOSIS — M65949 Unspecified synovitis and tenosynovitis, unspecified hand: Secondary | ICD-10-CM

## 2019-08-16 HISTORY — DX: Synovitis and tenosynovitis, unspecified: M65.9

## 2019-08-16 HISTORY — DX: Unspecified synovitis and tenosynovitis, unspecified hand: M65.949

## 2019-08-16 NOTE — Assessment & Plan Note (Addendum)
Is on Zocor continue this at this time we will get updated labs in the near future. In addition encouraged heart healthy low-fat diet.

## 2019-08-16 NOTE — Assessment & Plan Note (Signed)
Reports doing well is on Flomax.

## 2019-08-16 NOTE — Assessment & Plan Note (Signed)
Charles Ayala is encouraged to maintain a well balanced diet that is low in salt. °Controlled, continue current medication regimen. °Additionally, he is also reminded that exercise is beneficial for heart health and control of  °Blood pressure. 30-60 minutes daily is recommended-walking was suggested. ° ° ° ° °

## 2019-08-16 NOTE — Assessment & Plan Note (Signed)
Encouraged use of a spica-like brace that limits the use of the thumb joint and wrist flexion.  Advised for him to wear this during the night and during activity.  In addition to exercise/stretching that was provided to him in the AVS icing and use of Tylenol as needed.  Possible x-ray to see if there is arthritis if these treatments do not work.  And referral if needed for changes in the joint. Patient acknowledged agreement and understanding of the plan.

## 2019-08-16 NOTE — Assessment & Plan Note (Signed)
March 2019, f/u due 2024

## 2019-08-16 NOTE — Assessment & Plan Note (Addendum)
Referral for pain management.  Appreciate collaboration his care please let PCP know if there is anything I can do ton my end

## 2019-09-02 ENCOUNTER — Encounter: Payer: Self-pay | Admitting: Family Medicine

## 2019-09-02 ENCOUNTER — Ambulatory Visit (INDEPENDENT_AMBULATORY_CARE_PROVIDER_SITE_OTHER): Payer: PPO | Admitting: Family Medicine

## 2019-09-02 ENCOUNTER — Other Ambulatory Visit: Payer: Self-pay

## 2019-09-02 VITALS — BP 104/68 | HR 62 | Temp 97.9°F | Resp 16 | Ht 72.0 in | Wt 194.0 lb

## 2019-09-02 DIAGNOSIS — M659 Synovitis and tenosynovitis, unspecified: Secondary | ICD-10-CM

## 2019-09-02 DIAGNOSIS — W57XXXA Bitten or stung by nonvenomous insect and other nonvenomous arthropods, initial encounter: Secondary | ICD-10-CM

## 2019-09-02 DIAGNOSIS — R21 Rash and other nonspecific skin eruption: Secondary | ICD-10-CM | POA: Diagnosis not present

## 2019-09-02 DIAGNOSIS — M65949 Unspecified synovitis and tenosynovitis, unspecified hand: Secondary | ICD-10-CM

## 2019-09-02 HISTORY — DX: Bitten or stung by nonvenomous insect and other nonvenomous arthropods, initial encounter: W57.XXXA

## 2019-09-02 NOTE — Assessment & Plan Note (Signed)
Improved with brace use.

## 2019-09-02 NOTE — Progress Notes (Signed)
Subjective:  Patient ID: Charles Ayala, male    DOB: Mar 23, 1951  Age: 69 y.o. MRN: [id number]  CC:  Chief Complaint  Patient presents with  . Wrist Pain    in for right wrist follow up visit. States the wrist brace has helped       HPI  HPI  Charles Ayala is a 69 year old male patient who recently established care with me.  Is in today for follow-up on wrist pain.  At the last visit when he establish care I had told him that he was presenting with tenosynovitis of the left thumb.  He reports that since he was wearing the brace it has improved tremendously.  Has not needed to wear the brace in the last several days.  He also wants me to look at a rash/bites that he might have received when he was at work in the yard yesterday.  He denies having any pain or extensive itching within.  Has not tried any over-the-counter treatment.  Did not have any ticks on him that he was aware of nor did he see the insect or item that might of caused the rash/bite.  Denies having any other issues or concerns to discuss today.  Today patient denies signs and symptoms of COVID 19 infection including fever, chills, cough, shortness of breath, and headache. Past Medical, Surgical, Social History, Allergies, and Medications have been Reviewed.   Past Medical History:  Diagnosis Date  . Anemia   . Arthritis   . Encounter for hepatitis C screening test for low risk patient 07/31/2017  . Heart murmur   . High risk medication use 07/31/2017  . History of stomach ulcers   . Hyperlipidemia   . Hypertension   . Nausea without vomiting 07/22/2017  . Neuropathy     Current Meds  Medication Sig  . ezetimibe (ZETIA) 10 MG tablet Take 1 tablet (10 mg total) by mouth daily.  Marland Kitchen gabapentin (NEURONTIN) 300 MG capsule Take 2 pills in the am, 1 pill in the afternoon, and 2 pills in the evening  . hydrochlorothiazide (HYDRODIURIL) 25 MG tablet Take 1 tablet (25 mg total) by mouth daily.  . naproxen sodium (ALEVE)  220 MG tablet Take 440 mg by mouth 2 (two) times daily as needed.  . simvastatin (ZOCOR) 20 MG tablet Take 1 tablet (20 mg total) by mouth at bedtime.  . tamsulosin (FLOMAX) 0.4 MG CAPS capsule Take 1 capsule (0.4 mg total) daily by mouth.  Marland Kitchen UNABLE TO FIND Spica-thumb/ tenosynovitis brace    ROS:  Review of Systems  Constitutional: Negative.   HENT: Negative.   Eyes: Negative.   Respiratory: Negative.   Cardiovascular: Negative.   Gastrointestinal: Negative.   Genitourinary: Negative.   Musculoskeletal: Negative.   Skin:       Some bites/rash was outside yesterday   Neurological: Negative.   Endo/Heme/Allergies: Negative.   Psychiatric/Behavioral: Negative.   All other systems reviewed and are negative.    Objective:   Today's Vitals: BP 104/68   Pulse 62   Temp 97.9 F (36.6 C) (Temporal)   Resp 16   Ht 6' (1.829 m)   Wt 194 lb (88 kg)   SpO2 99%   BMI 26.31 kg/m  Vitals with BMI 09/02/2019 08/12/2019 05/27/2019  Height 6\' 0"  6\' 0"  6\' 0"   Weight 194 lbs 194 lbs 1 oz 193 lbs  BMI 26.31 [id number]  Systolic 123456 [id number]  Diastolic 68 74 80  Pulse 62 57 -  Physical Exam Vitals and nursing note reviewed.  Constitutional:      Appearance: Normal appearance. He is well-developed and well-groomed. He is obese.  HENT:     Head: Normocephalic and atraumatic.     Right Ear: External ear normal.     Left Ear: External ear normal.     Mouth/Throat:     Comments: Mask in place Eyes:     General:        Right eye: No discharge.        Left eye: No discharge.     Conjunctiva/sclera: Conjunctivae normal.  Cardiovascular:     Rate and Rhythm: Normal rate and regular rhythm.     Pulses: Normal pulses.     Heart sounds: Normal heart sounds.  Pulmonary:     Effort: Pulmonary effort is normal.     Breath sounds: Normal breath sounds.  Musculoskeletal:        General: Normal range of motion.     Cervical back: Normal range of motion and neck supple.  Skin:     General: Skin is warm.     Comments: There noted 5 puncture like bug bites on the anterior medial side of the right thigh.  Some erythema surrounding the actual site no other signs of infection  There appears to be small possible vesicular/pustular type small bumps on posterior side of left forearm.  Last and then 5 cm in 1 section and about an inch in another section.   Neurological:     General: No focal deficit present.     Mental Status: He is alert and oriented to person, place, and time.  Psychiatric:        Attention and Perception: Attention normal.        Mood and Affect: Mood normal.        Speech: Speech normal.        Behavior: Behavior normal. Behavior is cooperative.        Thought Content: Thought content normal.        Cognition and Memory: Cognition normal.        Judgment: Judgment normal.      Assessment   1. Tenosynovitis of thumb   2. Bug bite, initial encounter   3. Rash and nonspecific skin eruption     Tests ordered No orders of the defined types were placed in this encounter.    Plan: Please see assessment and plan per problem list above.   No orders of the defined types were placed in this encounter.  15 minutes was spent with face-to-face time discussing the above diagnoses with assessment plan.  Patient to follow-up in 4 months   Perlie Mayo, NP

## 2019-09-02 NOTE — Assessment & Plan Note (Signed)
Currently no signs or symptoms of infection.  He reports that he did not see intake.  Advised for him to follow-up to make sure that he is checking himself or takes when he comes in from outside.  If he develops any signs or symptoms of infection which include swelling, redness, drainage please call the office back.  At this time we will not do any form of antibiotics.  If he has any itching he can take Benadryl or some other type of allergy medicine over the counter.

## 2019-09-02 NOTE — Patient Instructions (Signed)
I appreciate the opportunity to provide you with care for your health and wellness. Today we discussed: wrist follow up and bug bites   Follow up: 4 months   No labs or referrals today  Check for ticks after being outside.  Use brace as need.   Please continue to practice social distancing to keep you, your family, and our community safe.  If you must go out, please wear a mask and practice good handwashing.  It was a pleasure to see you and I look forward to continuing to work together on your health and well-being. Please do not hesitate to call the office if you need care or have questions about your care.  Have a wonderful day and week. With Gratitude, [name], DNP, AGNP-BC

## 2019-09-02 NOTE — Assessment & Plan Note (Signed)
Unsure if he got into poison oak or some other type of plant that is created a rash that appears to be somewhat poisonous looking in nature.  Advised to use Benadryl as needed no signs and symptoms of infection will not do any other prescription treatments at this time.

## 2019-11-12 ENCOUNTER — Other Ambulatory Visit: Payer: Self-pay | Admitting: *Deleted

## 2019-11-12 ENCOUNTER — Other Ambulatory Visit: Payer: Self-pay

## 2019-11-12 ENCOUNTER — Encounter: Payer: Self-pay | Admitting: Family Medicine

## 2019-11-12 ENCOUNTER — Ambulatory Visit (INDEPENDENT_AMBULATORY_CARE_PROVIDER_SITE_OTHER): Payer: PPO | Admitting: Family Medicine

## 2019-11-12 VITALS — BP 149/74 | HR 46 | Temp 97.8°F | Ht 72.0 in | Wt 189.8 lb

## 2019-11-12 DIAGNOSIS — E782 Mixed hyperlipidemia: Secondary | ICD-10-CM | POA: Diagnosis not present

## 2019-11-12 DIAGNOSIS — N400 Enlarged prostate without lower urinary tract symptoms: Secondary | ICD-10-CM | POA: Diagnosis not present

## 2019-11-12 DIAGNOSIS — Z0001 Encounter for general adult medical examination with abnormal findings: Secondary | ICD-10-CM | POA: Diagnosis not present

## 2019-11-12 DIAGNOSIS — I10 Essential (primary) hypertension: Secondary | ICD-10-CM | POA: Diagnosis not present

## 2019-11-12 DIAGNOSIS — G629 Polyneuropathy, unspecified: Secondary | ICD-10-CM

## 2019-11-12 DIAGNOSIS — R001 Bradycardia, unspecified: Secondary | ICD-10-CM | POA: Diagnosis not present

## 2019-11-12 DIAGNOSIS — R011 Cardiac murmur, unspecified: Secondary | ICD-10-CM

## 2019-11-12 DIAGNOSIS — D649 Anemia, unspecified: Secondary | ICD-10-CM | POA: Diagnosis not present

## 2019-11-12 LAB — POCT GLYCOSYLATED HEMOGLOBIN (HGB A1C)
HbA1c POC (<> result, manual entry): 5.9 % (ref 4.0–5.6)
HbA1c, POC (controlled diabetic range): 5.9 % (ref 0.0–7.0)
HbA1c, POC (prediabetic range): 5.9 % (ref 5.7–6.4)
Hemoglobin A1C: 5.9 % — AB (ref 4.0–5.6)

## 2019-11-12 MED ORDER — GABAPENTIN 300 MG PO CAPS: ORAL_CAPSULE | ORAL | 1 refills | Status: DC

## 2019-11-12 NOTE — Patient Instructions (Addendum)
I appreciate the opportunity to provide you with care for your health and wellness. Today we discussed: overall health   Follow up: 6 months same day as wife  Labs-fasting tomorrow morning. No referrals today  Needs to get [facility] appt soon for follow up on low heart rate  Continue a well balanced diet :) I hope the veggies grow well for y'all.  Have a great summer :)  Please continue to practice social distancing to keep you, your family, and our community safe.  If you must go out, please wear a mask and practice good handwashing.  It was a pleasure to see you and I look forward to continuing to work together on your health and well-being. Please do not hesitate to call the office if you need care or have questions about your care.  Have a wonderful day and week. With Gratitude, [name], DNP, AGNP-BC  HEALTH MAINTENANCE RECOMMENDATIONS:  It is recommended that you get at least 30 minutes of aerobic exercise at least 5 days/week (for weight loss, you may need as much as 60-90 minutes). This can be any activity that gets your heart rate up. This can be divided in 10-15 minute intervals if needed, but try and build up your endurance at least once a week.  Weight bearing exercise is also recommended twice weekly.  Eat a healthy diet with lots of vegetables, fruits and fiber.  "Colorful" foods have a lot of vitamins (ie green vegetables, tomatoes, red peppers, etc).  Limit sweet tea, regular sodas and alcoholic beverages, all of which has a lot of calories and sugar.  Up to 2 alcoholic drinks daily may be beneficial for men (unless trying to lose weight, watch sugars).  Drink a lot of water.  Sunscreen of at least SPF 30 should be used on all sun-exposed parts of the skin when outside between the hours of 10 am and 4 pm (not just when at beach or pool, but even with exercise, golf, tennis, and yard work!)  Use a sunscreen that says "broad spectrum" so it covers both UVA and UVB  rays, and make sure to reapply every 1-2 hours.  Remember to change the batteries in your smoke detectors when changing your clock times in the spring and fall.  Use your seat belt every time you are in a car, and please drive safely and not be distracted with cell phones and texting while driving.
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
## 2019-11-13 ENCOUNTER — Encounter: Payer: Self-pay | Admitting: Family Medicine

## 2019-11-13 DIAGNOSIS — R001 Bradycardia, unspecified: Secondary | ICD-10-CM | POA: Insufficient documentation

## 2019-11-13 DIAGNOSIS — E782 Mixed hyperlipidemia: Secondary | ICD-10-CM | POA: Insufficient documentation

## 2019-11-13 DIAGNOSIS — R011 Cardiac murmur, unspecified: Secondary | ICD-10-CM | POA: Insufficient documentation

## 2019-11-13 DIAGNOSIS — Z0001 Encounter for general adult medical examination with abnormal findings: Secondary | ICD-10-CM | POA: Insufficient documentation

## 2019-11-13 NOTE — Assessment & Plan Note (Signed)
Overall reports he is doing well on Flomax will continue at this time.

## 2019-11-13 NOTE — Assessment & Plan Note (Signed)
Heart healthy diet encouraged continue Zocor we will get updated labs.

## 2019-11-13 NOTE — Assessment & Plan Note (Signed)
Will be getting updated labs to make sure that his anemia is not causing more of an issue with his murmur and or other concerns related to his heart.

## 2019-11-13 NOTE — Assessment & Plan Note (Signed)
Not well controlled today in the office. Given the low heart rate I have reached out to Dr. Domenic Polite, We will not make any adjustments at this time.

## 2019-11-13 NOTE — Assessment & Plan Note (Signed)
Discussed PSA screening (risks/benefits), recommended at least 30 minutes of aerobic activity at least 5 days/week; proper sunscreen use reviewed; healthy diet and alcohol recommendations (less than or equal to 2 drinks/day) reviewed; regular seatbelt use; changing batteries in smoke detectors. Immunization recommendations discussed.  Colonoscopy recommendations reviewed. ° °

## 2019-11-13 NOTE — Assessment & Plan Note (Addendum)
Concerned as his bradycardia is in the 40s he is normally in the 50s.  Has not had any EKGs that demonstrated bradycardia in the 40s.  Advised to get an appointment with cardiologist sooner to have this checked.  I have additionally messaged Dr. Domenic Polite via epic to make him aware of this.

## 2019-11-13 NOTE — Assessment & Plan Note (Signed)
Heart murmur noted on exam.  Previous finding.  Is followed by cardiology.

## 2019-11-16 DIAGNOSIS — I10 Essential (primary) hypertension: Secondary | ICD-10-CM | POA: Diagnosis not present

## 2019-11-16 DIAGNOSIS — E7800 Pure hypercholesterolemia, unspecified: Secondary | ICD-10-CM | POA: Diagnosis not present

## 2019-11-17 ENCOUNTER — Telehealth: Payer: Self-pay | Admitting: *Deleted

## 2019-11-17 DIAGNOSIS — N400 Enlarged prostate without lower urinary tract symptoms: Secondary | ICD-10-CM | POA: Diagnosis not present

## 2019-11-17 DIAGNOSIS — D649 Anemia, unspecified: Secondary | ICD-10-CM | POA: Diagnosis not present

## 2019-11-17 DIAGNOSIS — R011 Cardiac murmur, unspecified: Secondary | ICD-10-CM | POA: Diagnosis not present

## 2019-11-17 DIAGNOSIS — E785 Hyperlipidemia, unspecified: Secondary | ICD-10-CM | POA: Diagnosis not present

## 2019-11-17 DIAGNOSIS — Z0001 Encounter for general adult medical examination with abnormal findings: Secondary | ICD-10-CM | POA: Diagnosis not present

## 2019-11-17 LAB — COMPLETE METABOLIC PANEL WITH GFR
AG Ratio: 1.6 (calc) (ref 1.0–2.5)
ALT: 10 U/L (ref 9–46)
AST: 19 U/L (ref 10–35)
Albumin: 4.3 g/dL (ref 3.6–5.1)
Alkaline phosphatase (APISO): 70 U/L (ref 35–144)
BUN: 18 mg/dL (ref 7–25)
CO2: 33 mmol/L — ABNORMAL HIGH (ref 20–32)
Calcium: 9.7 mg/dL (ref 8.6–10.3)
Chloride: 101 mmol/L (ref 98–110)
Creat: 1.05 mg/dL (ref 0.70–1.25)
GFR, Est African American: 84 mL/min/{1.73_m2} (ref >=60)
GFR, Est Non African American: 72 mL/min/{1.73_m2} (ref >=60)
Globulin: 2.7 g/dL (calc) (ref 1.9–3.7)
Glucose, Bld: 93 mg/dL (ref 65–99)
Potassium: 4.5 mmol/L (ref 3.5–5.3)
Sodium: 142 mmol/L (ref 135–146)
Total Bilirubin: 0.8 mg/dL (ref 0.2–1.2)
Total Protein: 7 g/dL (ref 6.1–8.1)

## 2019-11-17 LAB — CBC
HCT: 40.5 % (ref 38.5–50.0)
Hemoglobin: 12.9 g/dL — ABNORMAL LOW (ref 13.2–17.1)
MCH: 28.4 pg (ref 27.0–33.0)
MCHC: 31.9 g/dL — ABNORMAL LOW (ref 32.0–36.0)
MCV: 89.2 fL (ref 80.0–100.0)
MPV: 11.3 fL (ref 7.5–12.5)
Platelets: 209 10*3/uL (ref 140–400)
RBC: 4.54 10*6/uL (ref 4.20–5.80)
RDW: 13.2 % (ref 11.0–15.0)
WBC: 5.4 10*3/uL (ref 3.8–10.8)

## 2019-11-17 LAB — LIPID PANEL
Cholesterol: 172 mg/dL (ref <200)
HDL: 57 mg/dL (ref >=40)
LDL Cholesterol (Calc): 101 mg/dL (calc) — ABNORMAL HIGH
Non-HDL Cholesterol (Calc): 115 mg/dL (calc) (ref <130)
Total CHOL/HDL Ratio: 3 (calc) (ref <5.0)
Triglycerides: 56 mg/dL (ref <150)

## 2019-11-17 LAB — TSH: TSH: 0.6 mIU/L (ref 0.40–4.50)

## 2019-11-17 LAB — PSA: PSA: 1.3 ng/mL (ref <=4.0)

## 2019-11-17 NOTE — Telephone Encounter (Signed)
-----   Message from Satira Sark, MD sent at 11/14/2019  7:28 AM EDT ----- Regarding: RE: Change in heart rate Thank you for letting me know, I was out Friday afternoon. Asymptomatic sinus bradycardia is probably not a concern (I reviewed the ECG), but I would like to see him back and talk with him more, also see if he needs a follow-up echocardiogram. I saw him in December 2019 last time. I will let my nurse know to get him a follow-up visit. ----- Message ----- From: Perlie Mayo, NP Sent: 11/13/2019   2:42 PM EDT To: Satira Sark, MD Subject: Change in heart rate                           Good afternoon Dr. Domenic Polite  We share patient, Charles Ayala I saw him yesterday in the office for a physical noted that he had a lower heart rate than his usual marked at 46 on physical exam with noted murmur.  Decided to get an EKG he did have marked sinus bradycardia at 44 bpm.  He is asymptomatic.  However I wanted to reach out to you to make sure that he got into see you sooner than later to make sure that he was okay and that there were no other issues or concerns.  Please let me know if there is anything that I can facilitate on my end I appreciate your time and attention and collaboration in his care.  With gratitude Jarrett Soho

## 2019-11-18 ENCOUNTER — Other Ambulatory Visit: Payer: Self-pay

## 2019-11-18 DIAGNOSIS — D649 Anemia, unspecified: Secondary | ICD-10-CM

## 2019-11-18 NOTE — Telephone Encounter (Signed)
Patient informed and verbalized understanding of plan. 

## 2019-11-22 NOTE — Progress Notes (Signed)
Cardiology Office Note  Date: 11/23/2019   ID: Charles Ayala, DOB 21-May-1951, MRN [id number]  PCP:  Perlie Mayo, NP  Cardiologist:  Rozann Lesches, MD Electrophysiologist:  None   Chief Complaint: Bradycardia, Heart Murmur  History of Present Illness: Charles Ayala is a 69 y.o. male with a history of bradycardia, heart murmur,  Mild AS and MR, PAD (moderate to severe on the right, mild on left), Carotid artery disease( RICA 03-47%, LICA 4-25%)  Saw me on 05/27/2019 admitted to mild claudication in right calf when ambulating but quickly resolved. He had an upcoming repeat carotid study. BP was elevated. He was continuing his HCTZ  25 mg daily.  Patient has been doing well.  He was referred here by his new provider due to a slow heart rate and a heart murmur.  He is asymptomatic.  He denies any orthostatic symptoms/presyncope/syncopal episodes., palpitations or arrhythmias, denies any progressive anginal or exertional symptoms.  States he works out in his garden every day.  Denies any PND or orthopnea.  Denies any lower extremity edema.  He had mild aortic stenosis and moderate MR on echo in 2019.   Past Medical History:  Diagnosis Date  . Anemia   . Arthritis   . Bug bite 09/02/2019  . Encounter for hepatitis C screening test for low risk patient 07/31/2017  . Heart murmur   . High risk medication use 07/31/2017  . History of stomach ulcers   . Hyperlipidemia   . Hyperlipidemia LDL goal <100 07/31/2017  . Hypertension   . Nausea without vomiting 07/22/2017  . Neuropathy     Past Surgical History:  Procedure Laterality Date  . BACK SURGERY  1997   L4 L5  . CARPAL TUNNEL RELEASE Right   . COLONOSCOPY  02/2009   Dr. Laural Golden: few sigmoid diverticula, one at cecum, small submucosal lipoma at prox transverse colon  . COLONOSCOPY WITH PROPOFOL N/A 08/22/2017   Dr. Gala Romney: two tubular adenomas, sigmoid diverticulosis, colonic lipoma  . ESOPHAGOGASTRODUODENOSCOPY (EGD) WITH PROPOFOL  N/A 08/22/2017   Dr. Gala Romney: normal esophagus, normal stomach, normal duodenum  . POLYPECTOMY  08/22/2017   Procedure: POLYPECTOMY;  Surgeon: Daneil Dolin, MD;  Location: [facility];  Service: Endoscopy;;  sigmoid    Current Outpatient Medications  Medication Sig Dispense Refill  . ezetimibe (ZETIA) 10 MG tablet Take 1 tablet (10 mg total) by mouth daily. 90 tablet 3  . gabapentin (NEURONTIN) 300 MG capsule Take 2 pills in the am, 1 pill in the afternoon, and 2 pills in the evening 450 capsule 1  . hydrochlorothiazide (HYDRODIURIL) 25 MG tablet Take 1 tablet (25 mg total) by mouth daily. 90 tablet 1  . naproxen sodium (ALEVE) 220 MG tablet Take 440 mg by mouth 2 (two) times daily as needed.    . simvastatin (ZOCOR) 20 MG tablet Take 1 tablet (20 mg total) by mouth at bedtime. 90 tablet 1  . tamsulosin (FLOMAX) 0.4 MG CAPS capsule Take 1 capsule (0.4 mg total) daily by mouth. 90 capsule 3  . UNABLE TO FIND Spica-thumb/ tenosynovitis brace 1 Device 0   No current facility-administered medications for this visit.   Allergies:  Patient has no known allergies.   Social History: The patient  reports that he has never smoked. He has never used smokeless tobacco. He reports that he does not drink alcohol or use drugs.   Family History: The patient's family history includes Cancer in his sister; Diabetes in his son;  Hypertension in his father and mother; Parkinson's disease in his mother.   ROS:  Please see the history of present illness. Otherwise, complete review of systems is positive for none.  All other systems are reviewed and negative.   Physical Exam: VS:  BP 114/70   Pulse (!) 50   Ht 6' (1.829 m)   Wt 191 lb 9.6 oz (86.9 kg)   SpO2 95%   BMI 25.99 kg/m , BMI Body mass index is 25.99 kg/m.  Wt Readings from Last 3 Encounters:  11/23/19 191 lb 9.6 oz (86.9 kg)  11/12/19 189 lb 12.8 oz (86.1 kg)  09/02/19 194 lb (88 kg)    General: Patient appears comfortable at rest. Neck:  Supple, no elevated JVP or carotid bruits, no thyromegaly. Lungs: Clear to auscultation, nonlabored breathing at rest. Cardiac: Regular rate and rhythm, no S3 or significant systolic murmur, no pericardial rub. Extremities: No pitting edema, distal pulses 2+. Skin: Warm and dry. Musculoskeletal: No kyphosis. Neuropsychiatric: Alert and oriented x3, affect grossly appropriate.  ECG:  EKG on Nov 12, 2019 showed heart rate of 44 sinus bradycardia, left axis deviation.  Recent Labwork: 11/17/2019: ALT 10; AST 19; BUN 18; Creat 1.05; Hemoglobin 12.9; Platelets 209; Potassium 4.5; Sodium 142; TSH 0.60     Component Value Date/Time   CHOL 172 11/17/2019 0809   TRIG 56 11/17/2019 0809   HDL 57 11/17/2019 0809   CHOLHDL 3.0 11/17/2019 0809   LDLCALC 101 (H) 11/17/2019 0809    Other Studies Reviewed Today:  Lower extremity arterial duplex 08/30/2017 Final Interpretation:  Right: Resting right ankle-brachial index indicates moderate right lower  extremity arterial disease. The right toe-brachial index is abnormal. PPG  tracings appear dampened.  Left: Resting left ankle-brachial index is within normal range. No  evidence of significant left lower extremity arterial disease. The left  toe-brachial index is normal. PPG tracings appear dampened, except third  toe.   See Arterial Duplex study.   Final Interpretation:  Right: 30-49% disease without focal stenosis noted in the common     femoral artery. 75-99% stenosis noted in the distal     superficial femoral artery. Occluded tibial-peroneal trunk.  Left: 30-49% disease without focal stenosis noted in the common    femoral artery. 30-49% disease without focal stenosis noted in    the superficial femoral artery. 50-74% stenosis noted in the    anterior tibial artery.     Carotid artery duplex 06/04/2019  Right Carotid: Velocities in the right ICA are consistent with a 1-39% stenosis. Left Carotid: Velocities in the  left ICA are consistent with a 1-39% stenosis. Vertebrals: Bilateral vertebral arteries demonstrate antegrade flow. Subclavians: Normal flow hemodynamics were seen in bilateral subclavian arteries  Carotid Dopplers 03/12/2018: Final Interpretation:  Right Carotid: Velocities in the right ICA are consistent with a 40-59%  stenosis.    Left Carotid: Velocities in the left ICA are consistent with a 1-39% stenosis.    Vertebrals: Bilateral vertebral arteries demonstrate antegrade flow.  Subclavians: Normal flow hemodynamics were seen in bilateral subclavian  arteries.  Echocardiogram dated June 17, 2018 Study Conclusions  - Left ventricle: The cavity size was normal. Wall thickness was increased in a pattern of mild LVH. Systolic function was normal. The estimated ejection fraction was in the range of 60% to 65%. Wall motion was normal; there were no regional wall motion abnormalities. Findings consistent with left ventricular diastolic dysfunction, grade indeterminate. - Aortic valve: Trileaflet; mildly thickened, mildly calcified leaflets. There was  mild stenosis. Valve area (VTI): 1.64 cm^2. Valve area (Vmax): 1.59 cm^2. Valve area (Vmean): 1.59 cm^2. - Aorta: Aortic root dimension: 37 mm (ED). - Mitral valve: Mildly calcified annulus. Mildly thickened leaflets . There was moderate regurgitation. Mild posterior leaflet prolapse. - Left atrium: The atrium was moderately dilated. - Tricuspid valve: There was mild regurgitation. - Pulmonic valve: There was mild regurgitation  Assessment and Plan:  1. Heart rate slow   2. Heart murmur   3. Bilateral carotid artery stenosis   4. PAD (peripheral artery disease) ([place])   5. Hyperlipidemia, unspecified hyperlipidemia type   6. Essential hypertension    1. Heart rate slow Heart rate is 50 today.  He had a previous EKG showing bradycardia of 44 as shown above in the EKG  section.  2. Heart murmur He has a mild 2/6 murmur heard at left lower sternal border.  He does have moderate MR and mild aortic stenosis on echo in December 2019  3. Bilateral carotid artery stenosis Last carotid artery duplex study showed 1 to 39% R ICA and 1 to 71% LICA [id number]  4. PAD (peripheral artery disease) ([facility]) Moderate right lower extremity disease.  Left ABI normal 08/2017.  Patient denies any symptoms of claudication.  5. Hyperlipidemia, unspecified hyperlipidemia type Recent lipid panel on 11/17/2019 showed total cholesterol 172, HDL 57, triglycerides 56, LDL 101.  Continue simvastatin 20 mg and Zetia 10 mg daily.  6.  Hypertension Patient is normotensive today with a blood pressure of 114/70.  Continue HCTZ 25 mg daily.  Medication Adjustments/Labs and Tests Ordered: Current medicines are reviewed at length with the patient today.  Concerns regarding medicines are outlined above.   Disposition: Follow-up with Dr. Domenic Polite or APP 1 year  Signed, Levell July, NP 11/23/2019 9:09 AM    [facility] [place], [place], [place] [address] Phone: [phone]; Fax: [phone]

## 2019-11-23 ENCOUNTER — Other Ambulatory Visit: Payer: Self-pay

## 2019-11-23 ENCOUNTER — Encounter: Payer: Self-pay | Admitting: Family Medicine

## 2019-11-23 ENCOUNTER — Ambulatory Visit (INDEPENDENT_AMBULATORY_CARE_PROVIDER_SITE_OTHER): Payer: PPO | Admitting: Family Medicine

## 2019-11-23 VITALS — BP 114/70 | HR 50 | Ht 72.0 in | Wt 191.6 lb

## 2019-11-23 DIAGNOSIS — I739 Peripheral vascular disease, unspecified: Secondary | ICD-10-CM | POA: Diagnosis not present

## 2019-11-23 DIAGNOSIS — E785 Hyperlipidemia, unspecified: Secondary | ICD-10-CM | POA: Diagnosis not present

## 2019-11-23 DIAGNOSIS — R011 Cardiac murmur, unspecified: Secondary | ICD-10-CM

## 2019-11-23 DIAGNOSIS — I6523 Occlusion and stenosis of bilateral carotid arteries: Secondary | ICD-10-CM | POA: Diagnosis not present

## 2019-11-23 DIAGNOSIS — R001 Bradycardia, unspecified: Secondary | ICD-10-CM

## 2019-11-23 DIAGNOSIS — I10 Essential (primary) hypertension: Secondary | ICD-10-CM

## 2019-11-23 NOTE — Patient Instructions (Addendum)

## 2019-12-16 DIAGNOSIS — E7800 Pure hypercholesterolemia, unspecified: Secondary | ICD-10-CM | POA: Diagnosis not present

## 2019-12-16 DIAGNOSIS — I10 Essential (primary) hypertension: Secondary | ICD-10-CM | POA: Diagnosis not present

## 2020-01-06 ENCOUNTER — Ambulatory Visit: Payer: PPO | Admitting: Family Medicine

## 2020-01-21 DIAGNOSIS — H2513 Age-related nuclear cataract, bilateral: Secondary | ICD-10-CM | POA: Diagnosis not present

## 2020-01-21 DIAGNOSIS — H40033 Anatomical narrow angle, bilateral: Secondary | ICD-10-CM | POA: Diagnosis not present

## 2020-05-09 ENCOUNTER — Encounter: Payer: Self-pay | Admitting: Nurse Practitioner

## 2020-05-09 ENCOUNTER — Ambulatory Visit (INDEPENDENT_AMBULATORY_CARE_PROVIDER_SITE_OTHER): Payer: PPO | Admitting: Nurse Practitioner

## 2020-05-09 ENCOUNTER — Other Ambulatory Visit: Payer: Self-pay

## 2020-05-09 VITALS — BP 155/61 | HR 62 | Temp 98.3°F | Resp 18 | Ht 72.0 in | Wt 179.0 lb

## 2020-05-09 DIAGNOSIS — M79641 Pain in right hand: Secondary | ICD-10-CM | POA: Diagnosis not present

## 2020-05-09 DIAGNOSIS — K921 Melena: Secondary | ICD-10-CM | POA: Diagnosis not present

## 2020-05-09 DIAGNOSIS — M79642 Pain in left hand: Secondary | ICD-10-CM

## 2020-05-09 DIAGNOSIS — R1030 Lower abdominal pain, unspecified: Secondary | ICD-10-CM | POA: Diagnosis not present

## 2020-05-09 MED ORDER — CIPROFLOXACIN HCL 500 MG PO TABS: 500.0000 mg | ORAL_TABLET | Freq: Two times a day (BID) | ORAL | 0 refills | Status: AC

## 2020-05-09 NOTE — Addendum Note (Signed)
Addended by: Demetrius Revel on: 05/09/2020 10:59 AM   Modules accepted: Orders

## 2020-05-09 NOTE — Patient Instructions (Signed)
Melena/Lower Abdominal Pain -unsure of etiology -will check labs today -Rx. cipro empirically for enteritis -sent home with hemoccults  Bilateral Hand Pain -likely osteoarthritis, since there is no redness or warmth to joints of fingers -will check auto-immune panel today since he was requesting specialist care -will consider referral if autoimmune disease present -he has been taking aleve without much relief; discussed swapping to tylenol, especially since we are concerned for bleed  Follow-up in a week if there is no improvement; sooner if pain gets worse or bleeding is obvious; if pain is intense or bleeding severe, report to an emergency department immediately

## 2020-05-09 NOTE — Progress Notes (Signed)
Acute Office Visit  Subjective:    Patient ID: Charles Ayala, male    DOB: 1951/04/22, 69 y.o.   MRN: [id number]  Chief Complaint  Patient presents with  . Abdominal Pain    x 4 days. Tarry stool x 1    HPI Patient is in today for abdominal pain.  Pain started on Friday, and he noticed a dark tarry stool.  He rates his pain at 7-8/10, and he states he was doubled over in pain over the weekend.  His pain is worse in his lower abdomen, but is not localized to one side.  He states that his hands have been hurting as well. But this is chronic in nature.  He asks if seeing a specialist would be appropriate.  Past Medical History:  Diagnosis Date  . Anemia   . Arthritis   . Bug bite 09/02/2019  . Encounter for hepatitis C screening test for low risk patient 07/31/2017  . Heart murmur   . High risk medication use 07/31/2017  . History of stomach ulcers   . Hyperlipidemia   . Hyperlipidemia LDL goal <100 07/31/2017  . Hypertension   . Nausea without vomiting 07/22/2017  . Neuropathy     Past Surgical History:  Procedure Laterality Date  . BACK SURGERY  1997   L4 L5  . CARPAL TUNNEL RELEASE Right   . COLONOSCOPY  02/2009   Dr. Laural Golden: few sigmoid diverticula, one at cecum, small submucosal lipoma at prox transverse colon  . COLONOSCOPY WITH PROPOFOL N/A 08/22/2017   Dr. Gala Romney: two tubular adenomas, sigmoid diverticulosis, colonic lipoma  . ESOPHAGOGASTRODUODENOSCOPY (EGD) WITH PROPOFOL N/A 08/22/2017   Dr. Gala Romney: normal esophagus, normal stomach, normal duodenum  . POLYPECTOMY  08/22/2017   Procedure: POLYPECTOMY;  Surgeon: Daneil Dolin, MD;  Location: [facility];  Service: Endoscopy;;  sigmoid    Family History  Problem Relation Age of Onset  . Parkinson's disease Mother   . Hypertension Mother   . Hypertension Father   . Cancer Sister        Chemo related lung problems  . Diabetes Son   . Colon cancer Neg Hx   . Liver disease Neg Hx   . Pancreatic cancer Neg Hx      Social History   Socioeconomic History  . Marital status: Married    Spouse name: Milda Smart  . Number of children: 4  . Years of education: Not on file  . Highest education level: Associate degree: occupational, Hotel manager, or vocational program  Occupational History  . Occupation: retired  Tobacco Use  . Smoking status: Never Smoker  . Smokeless tobacco: Never Used  Vaping Use  . Vaping Use: Never used  Substance and Sexual Activity  . Alcohol use: No  . Drug use: No  . Sexual activity: Yes  Other Topics Concern  . Not on file  Social History Narrative   Retired. Used to work at [employer].       Married 42 years    Lives in [place].   Has four children. 15 grandchildren, 5 great grandchildren       Enjoys gardening and playing with grandchildren.       Diet: veggies, limited red meat, most chicken and fish, fruits   Caffeine: 2 cups of coffee, no sodas   Water: 6-8 cups       Wears seat belt    Smoke detectors at home   Does not use phone while driving   Social  Determinants of Health   Financial Resource Strain: Low Risk   . Difficulty of Paying Living Expenses: Not hard at all  Food Insecurity: No Food Insecurity  . Worried About Charity fundraiser in the Last Year: Never true  . Ran Out of Food in the Last Year: Never true  Transportation Needs: No Transportation Needs  . Lack of Transportation (Medical): No  . Lack of Transportation (Non-Medical): No  Physical Activity: Sufficiently Active  . Days of Exercise per Week: 5 days  . Minutes of Exercise per Session: 30 min  Stress: No Stress Concern Present  . Feeling of Stress : Only a little  Social Connections: Moderately Isolated  . Frequency of Communication with Friends and Family: More than three times a week  . Frequency of Social Gatherings with Friends and Family: More than three times a week  . Attends Religious Services: Never  . Active Member of Clubs or Organizations: No  . Attends English as a second language teacher Meetings: Never  . Marital Status: Married  Human resources officer Violence: Not At Risk  . Fear of Current or Ex-Partner: No  . Emotionally Abused: No  . Physically Abused: No  . Sexually Abused: No    Outpatient Medications Prior to Visit  Medication Sig Dispense Refill  . ezetimibe (ZETIA) 10 MG tablet Take 1 tablet (10 mg total) by mouth daily. 90 tablet 3  . gabapentin (NEURONTIN) 300 MG capsule Take 2 pills in the am, 1 pill in the afternoon, and 2 pills in the evening 450 capsule 1  . hydrochlorothiazide (HYDRODIURIL) 25 MG tablet Take 1 tablet (25 mg total) by mouth daily. 90 tablet 1  . naproxen sodium (ALEVE) 220 MG tablet Take 440 mg by mouth 2 (two) times daily as needed.    . simvastatin (ZOCOR) 20 MG tablet Take 1 tablet (20 mg total) by mouth at bedtime. 90 tablet 1  . tamsulosin (FLOMAX) 0.4 MG CAPS capsule Take 1 capsule (0.4 mg total) daily by mouth. 90 capsule 3  . UNABLE TO FIND Spica-thumb/ tenosynovitis brace 1 Device 0   No facility-administered medications prior to visit.    No Known Allergies  Review of Systems  Constitutional: Negative.   Respiratory: Negative.   Gastrointestinal: Positive for abdominal pain, blood in stool and vomiting. Negative for abdominal distention, constipation, diarrhea, nausea and rectal pain.       Objective:    Physical Exam Constitutional:      General: He is not in acute distress.    Appearance: He is well-developed.  Cardiovascular:     Rate and Rhythm: Normal rate and regular rhythm.  Abdominal:     General: Bowel sounds are increased. There is no distension. There are no signs of injury.     Palpations: Abdomen is soft.     Tenderness: There is abdominal tenderness in the right lower quadrant and left lower quadrant. There is no guarding.  Neurological:     Mental Status: He is alert.     BP (!) 155/61   Pulse 62   Temp 98.3 F (36.8 C)   Resp 18   Ht 6' (1.829 m)   Wt 179 lb (81.2 kg)   SpO2  98%   BMI 24.28 kg/m  Wt Readings from Last 3 Encounters:  05/09/20 179 lb (81.2 kg)  11/23/19 191 lb 9.6 oz (86.9 kg)  11/12/19 189 lb 12.8 oz (86.1 kg)    Health Maintenance Due  Topic Date Due  . INFLUENZA VACCINE  01/17/2020    There are no preventive care reminders to display for this patient.   Lab Results  Component Value Date   TSH 0.60 11/17/2019   Lab Results  Component Value Date   WBC 5.4 11/17/2019   HGB 12.9 (L) 11/17/2019   HCT 40.5 11/17/2019   MCV 89.2 11/17/2019   PLT 209 11/17/2019   Lab Results  Component Value Date   NA 142 11/17/2019   K 4.5 11/17/2019   CO2 33 (H) 11/17/2019   GLUCOSE 93 11/17/2019   BUN 18 11/17/2019   CREATININE 1.05 11/17/2019   BILITOT 0.8 11/17/2019   ALKPHOS 90 12/31/2017   AST 19 11/17/2019   ALT 10 11/17/2019   PROT 7.0 11/17/2019   ALBUMIN 4.2 12/31/2017   CALCIUM 9.7 11/17/2019   ANIONGAP 7 12/31/2017   Lab Results  Component Value Date   CHOL 172 11/17/2019   Lab Results  Component Value Date   HDL 57 11/17/2019   Lab Results  Component Value Date   LDLCALC 101 (H) 11/17/2019   Lab Results  Component Value Date   TRIG 56 11/17/2019   Lab Results  Component Value Date   CHOLHDL 3.0 11/17/2019   Lab Results  Component Value Date   HGBA1C 5.9 (A) 11/12/2019   HGBA1C 5.9 11/12/2019   HGBA1C 5.9 11/12/2019   HGBA1C 5.9 11/12/2019       Assessment & Plan:   Problem List Items Addressed This Visit    None    Visit Diagnoses    Lower abdominal pain    -  Primary   Melena       Bilateral hand pain         Melena/Lower Abdominal Pain -unsure of etiology -will check labs today -Rx. cipro empirically for enteritis -sent home with hemoccults  Bilateral Hand Pain -likely osteoarthritis, since there is no redness or warmth to joints of fingers -will check auto-immune panel today since he was requesting specialist care -will consider referral if autoimmune disease present -he has been  taking aleve without much relief; discussed swapping to tylenol, especially since we are concerned for bleed   No orders of the defined types were placed in this encounter.    Noreene Larsson, NP

## 2020-05-10 ENCOUNTER — Encounter: Payer: Self-pay | Admitting: Cardiovascular Disease

## 2020-05-10 ENCOUNTER — Ambulatory Visit: Payer: PPO | Admitting: Cardiovascular Disease

## 2020-05-10 VITALS — BP 118/64 | HR 64 | Ht 72.0 in | Wt 177.4 lb

## 2020-05-10 DIAGNOSIS — I739 Peripheral vascular disease, unspecified: Secondary | ICD-10-CM | POA: Diagnosis not present

## 2020-05-10 DIAGNOSIS — I350 Nonrheumatic aortic (valve) stenosis: Secondary | ICD-10-CM | POA: Diagnosis not present

## 2020-05-10 DIAGNOSIS — I6521 Occlusion and stenosis of right carotid artery: Secondary | ICD-10-CM | POA: Diagnosis not present

## 2020-05-10 DIAGNOSIS — E785 Hyperlipidemia, unspecified: Secondary | ICD-10-CM | POA: Diagnosis not present

## 2020-05-10 DIAGNOSIS — I10 Essential (primary) hypertension: Secondary | ICD-10-CM | POA: Diagnosis not present

## 2020-05-10 MED ORDER — ROSUVASTATIN CALCIUM 20 MG PO TABS: 20.0000 mg | ORAL_TABLET | Freq: Every day | ORAL | 3 refills | Status: DC

## 2020-05-10 NOTE — Progress Notes (Signed)
Cardiology Office Note   Date:  05/10/2020   ID:  Charles Ayala, DOB 04-24-1951, MRN [id number]  PCP:  Perlie Mayo, NP  Cardiologist: Dr. Domenic Polite  No chief complaint on file.     History of Present Illness: Charles Ayala is a 69 y.o. male who is here today for follow-up visit regarding peripheral arterial disease.  He has known history of mild aortic stenosis, moderate mitral regurgitation,essential hypertension, hyperlipidemia and chronic back pain.  He is not diabetic and does not smoke.  Family history is negative for coronary artery disease.   He is being followed for peripheral arterial disease with mild right calf claudication. Vascular testing in March of 2019 showed moderately reduced ABI on the right and 0.5 range with evidence of severe right SFA stenosis.  No significant disease on the left side.  He denies any claudication at the present time and denies any chest pain, shortness of breath or palpitations.  He takes his medications regularly.  Past Medical History:  Diagnosis Date  . Anemia   . Arthritis   . Bug bite 09/02/2019  . Encounter for hepatitis C screening test for low risk patient 07/31/2017  . Heart murmur   . High risk medication use 07/31/2017  . History of stomach ulcers   . Hyperlipidemia   . Hyperlipidemia LDL goal <100 07/31/2017  . Hypertension   . Nausea without vomiting 07/22/2017  . Neuropathy     Past Surgical History:  Procedure Laterality Date  . BACK SURGERY  1997   L4 L5  . CARPAL TUNNEL RELEASE Right   . COLONOSCOPY  02/2009   Dr. Laural Golden: few sigmoid diverticula, one at cecum, small submucosal lipoma at prox transverse colon  . COLONOSCOPY WITH PROPOFOL N/A 08/22/2017   Dr. Gala Romney: two tubular adenomas, sigmoid diverticulosis, colonic lipoma  . ESOPHAGOGASTRODUODENOSCOPY (EGD) WITH PROPOFOL N/A 08/22/2017   Dr. Gala Romney: normal esophagus, normal stomach, normal duodenum  . POLYPECTOMY  08/22/2017   Procedure: POLYPECTOMY;  Surgeon:  Daneil Dolin, MD;  Location: [facility];  Service: Endoscopy;;  sigmoid     Current Outpatient Medications  Medication Sig Dispense Refill  . ciprofloxacin (CIPRO) 500 MG tablet Take 1 tablet (500 mg total) by mouth 2 (two) times daily for 7 days. 14 tablet 0  . ezetimibe (ZETIA) 10 MG tablet Take 1 tablet (10 mg total) by mouth daily. 90 tablet 3  . gabapentin (NEURONTIN) 300 MG capsule Take 2 pills in the am, 1 pill in the afternoon, and 2 pills in the evening 450 capsule 1  . hydrochlorothiazide (HYDRODIURIL) 25 MG tablet Take 1 tablet (25 mg total) by mouth daily. 90 tablet 1  . naproxen sodium (ALEVE) 220 MG tablet Take 440 mg by mouth 2 (two) times daily as needed.    . tamsulosin (FLOMAX) 0.4 MG CAPS capsule Take 1 capsule (0.4 mg total) daily by mouth. 90 capsule 3  . UNABLE TO FIND Spica-thumb/ tenosynovitis brace 1 Device 0   No current facility-administered medications for this visit.    Allergies:   Patient has no known allergies.    Social History:  The patient  reports that he has never smoked. He has never used smokeless tobacco. He reports that he does not drink alcohol and does not use drugs.   Family History:  The patient's family history includes Cancer in his sister; Diabetes in his son; Hypertension in his father and mother; Parkinson's disease in his mother.    ROS:  Please see the history of present illness.   Otherwise, review of systems are positive for none.   All other systems are reviewed and negative.    PHYSICAL EXAM: VS:  BP 118/64   Pulse 64   Ht 6' (1.829 m)   Wt 177 lb 6.4 oz (80.5 kg)   SpO2 99%   BMI 24.06 kg/m  , BMI Body mass index is 24.06 kg/m. GEN: Well nourished, well developed, in no acute distress  HEENT: normal  Neck: no JVD, or masses.  Left carotid bruit Cardiac: RRR; no  rubs, or gallops,no edema .  2 / 6 crescendo decrescendo systolic murmur in the aortic area which is early peaking. Respiratory:  clear to auscultation  bilaterally, normal work of breathing GI: soft, nontender, nondistended, + BS MS: no deformity or atrophy  Skin: warm and dry, no rash Neuro:  Strength and sensation are intact Psych: euthymic mood, full affect Vascular: Femoral pulses normal bilaterally.     EKG:  EKG is ordered today. EKG showed sinus rhythm with a PVC.   Recent Labs: 11/17/2019: ALT 10; BUN 18; Creat 1.05; Hemoglobin 12.9; Platelets 209; Potassium 4.5; Sodium 142; TSH 0.60    Lipid Panel    Component Value Date/Time   CHOL 172 11/17/2019 0809   TRIG 56 11/17/2019 0809   HDL 57 11/17/2019 0809   CHOLHDL 3.0 11/17/2019 0809   LDLCALC 101 (H) 11/17/2019 0809      Wt Readings from Last 3 Encounters:  05/10/20 177 lb 6.4 oz (80.5 kg)  05/09/20 179 lb (81.2 kg)  11/23/19 191 lb 9.6 oz (86.9 kg)       PAD Screen 05/07/2017  Previous PAD dx? No  Previous surgical procedure? No  Pain with walking? No  Feet/toe relief with dangling? No  Painful, non-healing ulcers? No  Extremities discolored? No      ASSESSMENT AND PLAN:  1.  Peripheral arterial disease: Significant right SFA stenosis with moderately reduced ABI.  The patient has no symptoms of claudication at the present time and continues to be asymptomatic.  Continue medical therapy.  2. Mild aortic stenosis and moderate mitral regurgitation: Followed by Dr. Domenic Polite.  3.  Essential hypertension: Blood pressure is controlled  4.  Hyperlipidemia: He reports possible myalgia with rosuvastatin and atorvastatin.  Currently he is on simvastatin and Zetia with most recent LDL of 101.  I am going to rechallenge him again with rosuvastatin 20 mg daily to replace simvastatin to see if we can get his LDL below 70.  Repeat lipid and liver profile in 2 months.  5.  Right carotid stenosis: Most recent Doppler in December 2020 showed mild less than 40% stenosis bilaterally.   Disposition:   FU with me in 12 months  Signed,   Kathlyn Sacramento, MD  05/10/2020  10:44 AM    [place]

## 2020-05-10 NOTE — Patient Instructions (Signed)
Medication Instructions:  STOP the Simvastatin START Rosuvastatin 20 mg once daily  *If you need a refill on your cardiac medications before your next appointment, please call your pharmacy*   Lab Work: Your provider would like for you to return in 2 months to have the following labs drawn: fasting Lipid and Liver. You do not need an appointment for the lab. Once in our office lobby there is a podium where you can sign in and ring the doorbell to alert [place] that you are here. The lab is open from 8:00 am to 4:30 pm; closed for lunch from 12:45pm-1:45pm.  If you have labs (blood work) drawn today and your tests are completely normal, you will receive your results only by:  [place] (if you have MyChart) OR  A paper copy in the mail If you have any lab test that is abnormal or we need to change your treatment, we will call you to review the results.   Testing/Procedures: None ordered   Follow-Up: At [facility], you and your health needs are our priority.  As part of our continuing mission to provide you with exceptional heart care, we have created designated Provider Care Teams.  These Care Teams include your primary Cardiologist (physician) and Advanced Practice Providers (APPs -  Physician Assistants and Nurse Practitioners) who all work together to provide you with the care you need, when you need it.  We recommend signing up for the patient portal called "MyChart".  Sign up information is provided on this After Visit Summary.  MyChart is used to connect with patients for Virtual Visits (Telemedicine).  Patients are able to view lab/test results, encounter notes, upcoming appointments, etc.  Non-urgent messages can be sent to your provider as well.   To learn more about what you can do with MyChart, go to [email or web address].    Your next appointment:   12 month(s)  The format for your next appointment:   In Person  Provider:   Kathlyn Sacramento, MD

## 2020-05-13 ENCOUNTER — Ambulatory Visit: Payer: PPO | Admitting: Family Medicine

## 2020-05-13 ENCOUNTER — Other Ambulatory Visit: Payer: Self-pay | Admitting: Family Medicine

## 2020-05-13 DIAGNOSIS — G629 Polyneuropathy, unspecified: Secondary | ICD-10-CM

## 2020-05-20 ENCOUNTER — Ambulatory Visit (INDEPENDENT_AMBULATORY_CARE_PROVIDER_SITE_OTHER): Payer: PPO | Admitting: Family Medicine

## 2020-05-20 ENCOUNTER — Encounter: Payer: Self-pay | Admitting: Family Medicine

## 2020-05-20 ENCOUNTER — Other Ambulatory Visit: Payer: Self-pay

## 2020-05-20 VITALS — BP 122/70 | HR 54 | Temp 98.9°F | Ht 72.0 in | Wt 179.0 lb

## 2020-05-20 DIAGNOSIS — M79641 Pain in right hand: Secondary | ICD-10-CM | POA: Diagnosis not present

## 2020-05-20 DIAGNOSIS — M5106 Intervertebral disc disorders with myelopathy, lumbar region: Secondary | ICD-10-CM

## 2020-05-20 DIAGNOSIS — E782 Mixed hyperlipidemia: Secondary | ICD-10-CM | POA: Diagnosis not present

## 2020-05-20 DIAGNOSIS — M79642 Pain in left hand: Secondary | ICD-10-CM | POA: Diagnosis not present

## 2020-05-20 DIAGNOSIS — I10 Essential (primary) hypertension: Secondary | ICD-10-CM

## 2020-05-20 MED ORDER — MELOXICAM 7.5 MG PO TABS: 7.5000 mg | ORAL_TABLET | Freq: Every day | ORAL | 0 refills | Status: DC | PRN

## 2020-05-20 NOTE — Assessment & Plan Note (Signed)
Continue use of tylenol with adjunct use of mobic as needed.   Reviewed side effects, risks and benefits of medication.   Patient acknowledged agreement and understanding of the plan

## 2020-05-20 NOTE — Assessment & Plan Note (Signed)
Charles Ayala is encouraged to maintain a well balanced diet that is low in salt. Controlled, continue current medication regimen. Additionally, he is also reminded that exercise is beneficial for heart health and control of  Blood pressure. 30-60 minutes daily is recommended-walking was suggested.

## 2020-05-20 NOTE — Assessment & Plan Note (Signed)
Continue a heart health diet and Zocor updated labs at next visit

## 2020-05-20 NOTE — Assessment & Plan Note (Signed)
Continue use of tylenol with adjunct use of mobic as needed.   Reviewed side effects, risks and benefits of medication.   Patient acknowledged agreement and understanding of the plan.

## 2020-05-20 NOTE — Patient Instructions (Signed)
°  I appreciate the opportunity to provide you with care for your health and wellness. Today we discussed: overall health   Follow up: July with wife- CPE -fasting for labs same day  No labs or referrals today  Use Mobic as needed, try to avoid daily use. Continue to use tylenol for number one pain relief.  HAVE A MERRY CHRISTMAS AND A HAPPY NEW YEAR :)  Please continue to practice social distancing to keep you, your family, and our community safe.  If you must go out, please wear a mask and practice good handwashing.  It was a pleasure to see you and I look forward to continuing to work together on your health and well-being. Please do not hesitate to call the office if you need care or have questions about your care.  Have a wonderful day. With Gratitude, [name], DNP, AGNP-BC

## 2020-05-20 NOTE — Progress Notes (Signed)
Subjective:  Patient ID: Charles Ayala, male    DOB: 1951/01/31  Age: 69 y.o. MRN: [id number]  CC:  Chief Complaint  Patient presents with  . Hypertension      HPI  HPI  [name] is a pleasant 69 year old male patient of mine. He presents today with follow up on blood pressure. He also reports some acute symptoms to be addressed. History as detailed below.  Hypertension: Is active during the day, nothing structured.  He is adherent to a low-salt diet.  Cardiac symptoms: none. Patient denies: chest pain, chest pressure/discomfort, claudication, dyspnea, exertional chest pressure/discomfort, fatigue, irregular heart beat, lower extremity edema, near-syncope, orthopnea, palpitations, paroxysmal nocturnal dyspnea, syncope and tachypnea. Cardiovascular risk factors: advanced age (older than 58 for men, 41 for women), dyslipidemia, hypertension and male gender. Use of agents associated with hypertension: none.  Reports taking all medications as directed and denies side effects.  Bilateral hand pain: he was helping son over the holiday with wood work and he thinks he flared up the arthritis in them. He reports that he has tried tylenol without relief. He has rested his hands, but needs to use them daily so it is hard to do that. He also thinks he pulled his back a little doing this as well. Denies obvious injury.  Denies cauda equina syndrome. Denies having numbness or tingling in to leg from low back.    Today patient denies signs and symptoms of COVID 19 infection including fever, chills, cough, shortness of breath, and headache. Past Medical, Surgical, Social History, Allergies, and Medications have been Reviewed.   Past Medical History:  Diagnosis Date  . Anemia   . Arthritis   . Bug bite 09/02/2019  . Encounter for hepatitis C screening test for low risk patient 07/31/2017  . Heart murmur   . High risk medication use 07/31/2017  . History of stomach ulcers   . Hyperlipidemia     . Hyperlipidemia LDL goal <100 07/31/2017  . Hypertension   . Nausea without vomiting 07/22/2017  . Neuropathy     Current Meds  Medication Sig  . ezetimibe (ZETIA) 10 MG tablet Take 1 tablet (10 mg total) by mouth daily.  Marland Kitchen gabapentin (NEURONTIN) 300 MG capsule Take 2 pills in the am, 1 pill in the afternoon, and 2 pills in the evening  . hydrochlorothiazide (HYDRODIURIL) 25 MG tablet Take 1 tablet (25 mg total) by mouth daily.  . rosuvastatin (CRESTOR) 20 MG tablet Take 1 tablet (20 mg total) by mouth daily.  . tamsulosin (FLOMAX) 0.4 MG CAPS capsule Take 1 capsule (0.4 mg total) daily by mouth.  Marland Kitchen UNABLE TO FIND Spica-thumb/ tenosynovitis brace  . [DISCONTINUED] naproxen sodium (ALEVE) 220 MG tablet Take 440 mg by mouth 2 (two) times daily as needed.    ROS:  Review of Systems  Constitutional: Negative.   HENT: Negative.   Eyes: Negative.   Respiratory: Negative.   Cardiovascular: Negative.   Gastrointestinal: Negative.   Genitourinary: Negative.   Musculoskeletal: Positive for joint pain.  Skin: Negative.   Neurological: Negative.   Endo/Heme/Allergies: Negative.   Psychiatric/Behavioral: Negative.      Objective:   Today's Vitals: BP 122/70 (BP Location: Left Arm, Patient Position: Sitting, Cuff Size: Normal)   Pulse (!) 54   Temp 98.9 F (37.2 C) (Temporal)   Ht 6' (1.829 m)   Wt 179 lb (81.2 kg)   SpO2 97%   BMI 24.28 kg/m  Vitals with BMI 05/20/2020  05/10/2020 05/09/2020  Height 6\' 0"  6\' 0"  6\' 0"   Weight 179 lbs 177 lbs 6 oz 179 lbs  BMI 24.27 85.02 77.41  Systolic 287 867 672  Diastolic 70 64 61  Pulse 54 64 62     Physical Exam Vitals and nursing note reviewed.  Constitutional:      Appearance: Normal appearance. He is well-developed, well-groomed and normal weight.  HENT:     Head: Normocephalic and atraumatic.     Right Ear: External ear normal.     Left Ear: External ear normal.     Mouth/Throat:     Comments: Mask in place  Eyes:      General:        Right eye: No discharge.        Left eye: No discharge.     Conjunctiva/sclera: Conjunctivae normal.  Cardiovascular:     Rate and Rhythm: Normal rate and regular rhythm.     Pulses: Normal pulses.     Heart sounds: Normal heart sounds.  Pulmonary:     Effort: Pulmonary effort is normal.     Breath sounds: Normal breath sounds.  Musculoskeletal:        General: Normal range of motion.     Right hand: Tenderness present.     Left hand: Tenderness present.     Cervical back: Normal range of motion and neck supple.  Skin:    General: Skin is warm.  Neurological:     General: No focal deficit present.     Mental Status: He is alert and oriented to person, place, and time.  Psychiatric:        Attention and Perception: Attention normal.        Mood and Affect: Mood normal.        Speech: Speech normal.        Behavior: Behavior normal. Behavior is cooperative.        Thought Content: Thought content normal.        Cognition and Memory: Cognition normal.        Judgment: Judgment normal.     Comments: Good communication and eye contact      Assessment   1. Essential hypertension   2. Mixed hyperlipidemia   3. Bilateral hand pain   4. Intervertebral disc disorders with myelopathy of lumbar region     Tests ordered No orders of the defined types were placed in this encounter.    Plan: Please see assessment and plan per problem list above.   Meds ordered this encounter  Medications  . meloxicam (MOBIC) 7.5 MG tablet    Sig: Take 1 tablet (7.5 mg total) by mouth daily as needed for pain.    Dispense:  30 tablet    Refill:  0    Order Specific Question:   Supervising Provider    Answer:   Jacklynn Bue    Patient to follow-up in July for CPE 2022  Note: This dictation was prepared with Dragon dictation along with smaller phrase technology. Similar sounding words can be transcribed inadequately or may not be corrected upon review. Any  transcriptional errors that result from this process are unintentional.      Perlie Mayo, NP

## 2020-05-24 ENCOUNTER — Other Ambulatory Visit: Payer: Self-pay

## 2020-05-24 DIAGNOSIS — M79642 Pain in left hand: Secondary | ICD-10-CM | POA: Diagnosis not present

## 2020-05-24 DIAGNOSIS — E782 Mixed hyperlipidemia: Secondary | ICD-10-CM | POA: Diagnosis not present

## 2020-05-24 DIAGNOSIS — I10 Essential (primary) hypertension: Secondary | ICD-10-CM | POA: Diagnosis not present

## 2020-05-24 DIAGNOSIS — M79641 Pain in right hand: Secondary | ICD-10-CM | POA: Diagnosis not present

## 2020-05-24 DIAGNOSIS — R1030 Lower abdominal pain, unspecified: Secondary | ICD-10-CM | POA: Diagnosis not present

## 2020-05-25 LAB — CMP14+EGFR
ALT: 12 IU/L (ref 0–44)
AST: 18 IU/L (ref 0–40)
Albumin/Globulin Ratio: 1.7 (ref 1.2–2.2)
Albumin: 4.6 g/dL (ref 3.8–4.8)
Alkaline Phosphatase: 84 IU/L (ref 44–121)
BUN/Creatinine Ratio: 13 (ref 10–24)
BUN: 11 mg/dL (ref 8–27)
Bilirubin Total: 0.8 mg/dL (ref 0.0–1.2)
CO2: 25 mmol/L (ref 20–29)
Calcium: 9.5 mg/dL (ref 8.6–10.2)
Chloride: 100 mmol/L (ref 96–106)
Creatinine, Ser: 0.88 mg/dL (ref 0.76–1.27)
GFR calc Af Amer: 101 mL/min/{1.73_m2} (ref >59)
GFR calc non Af Amer: 87 mL/min/{1.73_m2} (ref >59)
Globulin, Total: 2.7 g/dL (ref 1.5–4.5)
Glucose: 91 mg/dL (ref 65–99)
Potassium: 3.8 mmol/L (ref 3.5–5.2)
Sodium: 141 mmol/L (ref 134–144)
Total Protein: 7.3 g/dL (ref 6.0–8.5)

## 2020-05-26 LAB — CBC WITH DIFFERENTIAL/PLATELET
Basophils Absolute: 0.1 10*3/uL (ref 0.0–0.2)
Basos: 1 %
EOS (ABSOLUTE): 0.2 10*3/uL (ref 0.0–0.4)
Eos: 3 %
Hematocrit: 38.5 % (ref 37.5–51.0)
Hemoglobin: 12.8 g/dL — ABNORMAL LOW (ref 13.0–17.7)
Immature Grans (Abs): 0 10*3/uL (ref 0.0–0.1)
Immature Granulocytes: 0 %
Lymphocytes Absolute: 2.5 10*3/uL (ref 0.7–3.1)
Lymphs: 46 %
MCH: 28.9 pg (ref 26.6–33.0)
MCHC: 33.2 g/dL (ref 31.5–35.7)
MCV: 87 fL (ref 79–97)
Monocytes Absolute: 0.4 10*3/uL (ref 0.1–0.9)
Monocytes: 7 %
Neutrophils Absolute: 2.4 10*3/uL (ref 1.4–7.0)
Neutrophils: 43 %
Platelets: 229 10*3/uL (ref 150–450)
RBC: 4.43 x10E6/uL (ref 4.14–5.80)
RDW: 13.1 % (ref 11.6–15.4)
WBC: 5.5 10*3/uL (ref 3.4–10.8)

## 2020-05-26 LAB — SEDIMENTATION RATE: Sed Rate: 12 mm/hr (ref 0–30)

## 2020-05-26 LAB — ANA: ANA Titer 1: NEGATIVE

## 2020-05-26 LAB — C-REACTIVE PROTEIN: CRP: <1 mg/L (ref 0–10)

## 2020-05-26 LAB — RHEUMATOID FACTOR: Rheumatoid fact SerPl-aCnc: <10 IU/mL (ref <14.0)

## 2020-06-09 ENCOUNTER — Other Ambulatory Visit: Payer: Self-pay | Admitting: Family Medicine

## 2020-06-09 DIAGNOSIS — M5106 Intervertebral disc disorders with myelopathy, lumbar region: Secondary | ICD-10-CM

## 2020-06-09 DIAGNOSIS — M79642 Pain in left hand: Secondary | ICD-10-CM

## 2020-06-09 DIAGNOSIS — M79641 Pain in right hand: Secondary | ICD-10-CM

## 2020-07-22 ENCOUNTER — Other Ambulatory Visit: Payer: Self-pay | Admitting: Family Medicine

## 2020-07-22 DIAGNOSIS — M79641 Pain in right hand: Secondary | ICD-10-CM

## 2020-07-22 DIAGNOSIS — M79642 Pain in left hand: Secondary | ICD-10-CM

## 2020-07-22 DIAGNOSIS — M5106 Intervertebral disc disorders with myelopathy, lumbar region: Secondary | ICD-10-CM

## 2020-08-20 ENCOUNTER — Other Ambulatory Visit: Payer: Self-pay | Admitting: Family Medicine

## 2020-08-20 DIAGNOSIS — M5106 Intervertebral disc disorders with myelopathy, lumbar region: Secondary | ICD-10-CM

## 2020-08-20 DIAGNOSIS — M79641 Pain in right hand: Secondary | ICD-10-CM

## 2020-08-20 DIAGNOSIS — M79642 Pain in left hand: Secondary | ICD-10-CM

## 2020-08-29 ENCOUNTER — Encounter: Payer: Self-pay | Admitting: *Deleted

## 2020-11-02 ENCOUNTER — Other Ambulatory Visit: Payer: Self-pay

## 2020-11-02 ENCOUNTER — Ambulatory Visit (INDEPENDENT_AMBULATORY_CARE_PROVIDER_SITE_OTHER): Payer: PPO

## 2020-11-02 VITALS — BP 126/64 | HR 53 | Temp 98.3°F | Ht 72.0 in | Wt 177.8 lb

## 2020-11-02 DIAGNOSIS — Z0000 Encounter for general adult medical examination without abnormal findings: Secondary | ICD-10-CM

## 2020-11-02 NOTE — Progress Notes (Signed)
Subjective:   Charles Ayala is a 70 y.o. male who presents for an Initial Medicare Annual Wellness Visit.  Review of Systems    N/A  Cardiac Risk Factors include: advanced age (>35men, >69 women);male gender     Objective:    Today's Vitals   11/02/20 1111  BP: 126/64  Pulse: (!) 53  Temp: 98.3 F (36.8 C)  TempSrc: Oral  SpO2: 98%  Weight: 177 lb 12 oz (80.6 kg)  Height: 6' (1.829 m)   Body mass index is 24.11 kg/m.  Advanced Directives 11/02/2020 02/14/2018 02/07/2018 02/05/2018 01/29/2018 01/22/2018 01/14/2018  Does Patient Have a Medical Advance Directive? No No No No No No No  Would patient like information on creating a medical advance directive? No - Patient declined No - Patient declined No - Patient declined No - Patient declined No - Patient declined - No - Patient declined    Current Medications (verified) Outpatient Encounter Medications as of 11/02/2020  Medication Sig  . gabapentin (NEURONTIN) 300 MG capsule TAKE 2 PILLS IN THE AM, 1 PILL IN THE AFTERNOON, AND 2 PILLS IN THE EVENING  . hydrochlorothiazide (HYDRODIURIL) 25 MG tablet Take 1 tablet (25 mg total) by mouth daily.  . rosuvastatin (CRESTOR) 20 MG tablet Take 1 tablet (20 mg total) by mouth daily.  . tamsulosin (FLOMAX) 0.4 MG CAPS capsule Take 1 capsule (0.4 mg total) daily by mouth.  . [DISCONTINUED] UNABLE TO FIND Spica-thumb/ tenosynovitis brace  . meloxicam (MOBIC) 7.5 MG tablet TAKE 1 TABLET BY MOUTH EVERY DAY AS NEEDED FOR PAIN (Patient not taking: Reported on 11/02/2020)  . [DISCONTINUED] ezetimibe (ZETIA) 10 MG tablet Take 1 tablet (10 mg total) by mouth daily.   No facility-administered encounter medications on file as of 11/02/2020.    Allergies (verified) Patient has no known allergies.   History: Past Medical History:  Diagnosis Date  . Anemia   . Arthritis   . Bug bite 09/02/2019  . Encounter for hepatitis C screening test for low risk patient 07/31/2017  . Heart murmur   . High risk  medication use 07/31/2017  . History of stomach ulcers   . Hyperlipidemia   . Hyperlipidemia LDL goal <100 07/31/2017  . Hypertension   . Nausea without vomiting 07/22/2017  . Neuropathy    Past Surgical History:  Procedure Laterality Date  . BACK SURGERY  1997   L4 L5  . CARPAL TUNNEL RELEASE Right   . COLONOSCOPY  02/2009   Dr. Laural Golden: few sigmoid diverticula, one at cecum, small submucosal lipoma at prox transverse colon  . COLONOSCOPY WITH PROPOFOL N/A 08/22/2017   Dr. Gala Romney: two tubular adenomas, sigmoid diverticulosis, colonic lipoma  . ESOPHAGOGASTRODUODENOSCOPY (EGD) WITH PROPOFOL N/A 08/22/2017   Dr. Gala Romney: normal esophagus, normal stomach, normal duodenum  . POLYPECTOMY  08/22/2017   Procedure: POLYPECTOMY;  Surgeon: Daneil Dolin, MD;  Location: [facility];  Service: Endoscopy;;  sigmoid   Family History  Problem Relation Age of Onset  . Parkinson's disease Mother   . Hypertension Mother   . Hypertension Father   . Cancer Sister        Chemo related lung problems  . Diabetes Son   . Colon cancer Neg Hx   . Liver disease Neg Hx   . Pancreatic cancer Neg Hx    Social History   Socioeconomic History  . Marital status: Married    Spouse name: Milda Smart  . Number of children: 4  . Years of education: Not  on file  . Highest education level: Associate degree: occupational, Hotel manager, or vocational program  Occupational History  . Occupation: retired  Tobacco Use  . Smoking status: Never Smoker  . Smokeless tobacco: Never Used  Vaping Use  . Vaping Use: Never used  Substance and Sexual Activity  . Alcohol use: No  . Drug use: No  . Sexual activity: Yes  Other Topics Concern  . Not on file  Social History Narrative   Retired. Used to work at [employer].       Married 42 years    Lives in [place].   Has four children. 15 grandchildren, 5 great grandchildren       Enjoys gardening and playing with grandchildren.       Diet: veggies, limited red meat, most  chicken and fish, fruits   Caffeine: 2 cups of coffee, no sodas   Water: 6-8 cups       Wears seat belt    Smoke detectors at home   Does not use phone while driving   Social Determinants of Health   Financial Resource Strain: Low Risk   . Difficulty of Paying Living Expenses: Not hard at all  Food Insecurity: No Food Insecurity  . Worried About Charity fundraiser in the Last Year: Never true  . Ran Out of Food in the Last Year: Never true  Transportation Needs: No Transportation Needs  . Lack of Transportation (Medical): No  . Lack of Transportation (Non-Medical): No  Physical Activity: Sufficiently Active  . Days of Exercise per Week: 7 days  . Minutes of Exercise per Session: 30 min  Stress: Not on file  Social Connections: Moderately Isolated  . Frequency of Communication with Friends and Family: Three times a week  . Frequency of Social Gatherings with Friends and Family: Three times a week  . Attends Religious Services: Never  . Active Member of Clubs or Organizations: No  . Attends Archivist Meetings: Never  . Marital Status: Married    Tobacco Counseling Counseling given: Not Answered   Clinical Intake:  Pre-visit preparation completed: Yes  Pain : No/denies pain     Nutritional Risks: None Diabetes: No  How often do you need to have someone help you when you read instructions, pamphlets, or other written materials from your doctor or pharmacy?: 1 - Never  Diabetic?No  Interpreter Needed?: No  Information entered by :: [place] of Daily Living In your present state of health, do you have any difficulty performing the following activities: 11/02/2020  Hearing? Y  Comment has tinnitus  Vision? N  Difficulty concentrating or making decisions? Y  Comment has some issues with short term memory loss  Walking or climbing stairs? N  Dressing or bathing? N  Doing errands, shopping? N  Preparing Food and eating ? N  Using the  Toilet? N  In the past six months, have you accidently leaked urine? N  Do you have problems with loss of bowel control? N  Managing your Medications? N  Managing your Finances? N  Housekeeping or managing your Housekeeping? N  Some recent data might be hidden    Patient Care Team: Perlie Mayo, NP as PCP - General (Family Medicine) Satira Sark, MD as PCP - Cardiology (Cardiology) Gala Romney Cristopher Estimable, MD as Consulting Physician (Gastroenterology)  Indicate any recent Medical Services you may have received from other than [facility] providers in the past year (date may be approximate).  Assessment:   This is a routine wellness examination for Brentt.  Hearing/Vision screen  Hearing Screening   125Hz  250Hz  500Hz  1000Hz  2000Hz  3000Hz  4000Hz  6000Hz  8000Hz   Right ear:           Left ear:           Vision Screening Comments: Patient currently wears glasses. Gets eyes examined once per year  Dietary issues and exercise activities discussed: Current Exercise Habits: Home exercise routine, Type of exercise: walking, Time (Minutes): 30, Frequency (Times/Week): 7, Weekly Exercise (Minutes/Week): 210, Intensity: Moderate, Exercise limited by: None identified  Goals Addressed            This Visit's Progress   . Patient Stated       I would like to maintain my current health      Depression Screen PHQ 2/9 Scores 11/02/2020 05/20/2020 05/09/2020 11/12/2019 08/12/2019 03/05/2018 01/08/2018  PHQ - 2 Score 0 0 0 0 0 0 0  PHQ- 9 Score - - - 0 - - -  Exception Documentation - - - - - - -    Fall Risk Fall Risk  11/02/2020 05/20/2020 05/09/2020 11/12/2019 08/12/2019  Falls in the past year? 0 0 0 0 0  Number falls in past yr: 0 0 0 0 0  Injury with Fall? 0 0 0 0 0  Risk for fall due to : No Fall Risks No Fall Risks No Fall Risks No Fall Risks -  Follow up Falls evaluation completed;Falls prevention discussed Falls evaluation completed Falls evaluation completed Falls evaluation completed -     FALL RISK PREVENTION PERTAINING TO THE HOME:  Any stairs in or around the home? Yes  If so, are there any without handrails? No  Home free of loose throw rugs in walkways, pet beds, electrical cords, etc? Yes  Adequate lighting in your home to reduce risk of falls? Yes   ASSISTIVE DEVICES UTILIZED TO PREVENT FALLS:  Life alert? No  Use of a cane, walker or w/c? No  Grab bars in the bathroom? Yes  Shower chair or bench in shower? No  Elevated toilet seat or a handicapped toilet? Yes   TIMED UP AND GO:  Was the test performed? Yes .  Length of time to ambulate 10 feet: 3 sec.   Gait steady and fast without use of assistive device  Cognitive Function:     6CIT Screen 11/02/2020  What Year? 0 points  What month? 0 points  What time? 0 points  Count back from 20 0 points  Months in reverse 2 points  Repeat phrase 10 points  Total Score 12    Immunizations Immunization History  Administered Date(s) Administered  . Fluad Quad(high Dose 65+) 03/18/2020  . Influenza,inj,Quad PF,6+ Mos 02/22/2017, 02/25/2018  . Moderna Sars-Covid-2 Vaccination 08/13/2019, 09/11/2019  . Pneumococcal Conjugate-13 02/22/2017  . Pneumococcal Polysaccharide-23 03/05/2018  . Tdap 02/22/2017    TDAP status: Up to date  Flu Vaccine status: Up to date  Pneumococcal vaccine status: Up to date  Covid-19 vaccine status: Completed vaccines  Qualifies for Shingles Vaccine? Yes   Zostavax completed No   Shingrix Completed?: No.    Education has been provided regarding the importance of this vaccine. Patient has been advised to call insurance company to determine out of pocket expense if they have not yet received this vaccine. Advised may also receive vaccine at local pharmacy or Health Dept. Verbalized acceptance and understanding.  Screening Tests Health Maintenance  Topic Date Due  .  COVID-19 Vaccine (3 - Booster for Moderna series) 02/11/2020  . INFLUENZA VACCINE  01/16/2021  .  TETANUS/TDAP  02/23/2027  . COLONOSCOPY (Pts 45-17yrs Insurance coverage will need to be confirmed)  08/23/2027  . Hepatitis C Screening  Completed  . PNA vac Low Risk Adult  Completed  . HPV VACCINES  Aged Out    Health Maintenance  Health Maintenance Due  Topic Date Due  . COVID-19 Vaccine (3 - Booster for Moderna series) 02/11/2020    Colorectal cancer screening: Type of screening: Colonoscopy. Completed 0307/2019. Repeat every 10 years  Lung Cancer Screening: (Low Dose CT Chest recommended if Age 34-80 years, 30 pack-year currently smoking OR have quit w/in 15years.) does not qualify.   Lung Cancer Screening Referral: N/A   Additional Screening:  Hepatitis C Screening: does qualify; Completed 01/03/2018  Vision Screening: Recommended annual ophthalmology exams for early detection of glaucoma and other disorders of the eye. Is the patient up to date with their annual eye exam?  Yes  Who is the provider or what is the name of the office in which the patient attends annual eye exams? [facility] If pt is not established with a provider, would they like to be referred to a provider to establish care? No .   Dental Screening: Recommended annual dental exams for proper oral hygiene  Community Resource Referral / Chronic Care Management: CRR required this visit?  No   CCM required this visit?  No      Plan:     I have personally reviewed and noted the following in the patient's chart:   . Medical and social history . Use of alcohol, tobacco or illicit drugs  . Current medications and supplements including opioid prescriptions. Patient is not currently taking opioid prescriptions. . Functional ability and status . Nutritional status . Physical activity . Advanced directives . List of other physicians . Hospitalizations, surgeries, and ER visits in previous 12 months . Vitals . Screenings to include cognitive, depression, and falls . Referrals and appointments  In  addition, I have reviewed and discussed with patient certain preventive protocols, quality metrics, and best practice recommendations. A written personalized care plan for preventive services as well as general preventive health recommendations were provided to patient.     Ofilia Neas, LPN   [id number]/7915   Nurse Notes: None

## 2020-11-02 NOTE — Patient Instructions (Addendum)
Charles Ayala , Thank you for taking time to come for your Medicare Wellness Visit. I appreciate your ongoing commitment to your health goals. Please review the following plan we discussed and let me know if I can assist you in the future.   Screening recommendations/referrals: Colonoscopy: up to date, next due 08/23/2027 Recommended yearly ophthalmology/optometry visit for glaucoma screening and checkup Recommended yearly dental visit for hygiene and checkup  Vaccinations: Influenza vaccine: Up to date, next due fall 2022 Pneumococcal vaccine: completed series Tdap vaccine: Up to date, next due 02/23/2027 Shingles vaccine: Currently due for Shingrix, if you would like to receive we recommend that you do so at your local pharmacy    Advanced directives: Advance directive discussed with you today. Even though you declined this today please call our office should you change your mind and we can give you the proper paperwork for you to fill out.   Conditions/risks identified: None  Next appointment: 02/02/2021 @ 10:20 am with Charles Beach, NP  Preventive Care 71 Years and Older, Male Preventive care refers to lifestyle choices and visits with your health care provider that can promote health and wellness. What does preventive care include?  A yearly physical exam. This is also called an annual well check.  Dental exams once or twice a year.  Routine eye exams. Ask your health care provider how often you should have your eyes checked.  Personal lifestyle choices, including:  Daily care of your teeth and gums.  Regular physical activity.  Eating a healthy diet.  Avoiding tobacco and drug use.  Limiting alcohol use.  Practicing safe sex.  Taking low doses of aspirin every day.  Taking vitamin and mineral supplements as recommended by your health care provider. What happens during an annual well check? The services and screenings done by your health care provider during your  annual well check will depend on your age, overall health, lifestyle risk factors, and family history of disease. Counseling  Your health care provider may ask you questions about your:  Alcohol use.  Tobacco use.  Drug use.  Emotional well-being.  Home and relationship well-being.  Sexual activity.  Eating habits.  History of falls.  Memory and ability to understand (cognition).  Work and work Statistician. Screening  You may have the following tests or measurements:  Height, weight, and BMI.  Blood pressure.  Lipid and cholesterol levels. These may be checked every 5 years, or more frequently if you are over 76 years old.  Skin check.  Lung cancer screening. You may have this screening every year starting at age 85 if you have a 30-pack-year history of smoking and currently smoke or have quit within the past 15 years.  Fecal occult blood test (FOBT) of the stool. You may have this test every year starting at age 68.  Flexible sigmoidoscopy or colonoscopy. You may have a sigmoidoscopy every 5 years or a colonoscopy every 10 years starting at age 16.  Prostate cancer screening. Recommendations will vary depending on your family history and other risks.  Hepatitis C blood test.  Hepatitis B blood test.  Sexually transmitted disease (STD) testing.  Diabetes screening. This is done by checking your blood sugar (glucose) after you have not eaten for a while (fasting). You may have this done every 1-3 years.  Abdominal aortic aneurysm (AAA) screening. You may need this if you are a current or former smoker.  Osteoporosis. You may be screened starting at age 41 if you are at high  risk. Talk with your health care provider about your test results, treatment options, and if necessary, the need for more tests. Vaccines  Your health care provider may recommend certain vaccines, such as:  Influenza vaccine. This is recommended every year.  Tetanus, diphtheria, and  acellular pertussis (Tdap, Td) vaccine. You may need a Td booster every 10 years.  Zoster vaccine. You may need this after age 58.  Pneumococcal 13-valent conjugate (PCV13) vaccine. One dose is recommended after age 42.  Pneumococcal polysaccharide (PPSV23) vaccine. One dose is recommended after age 34. Talk to your health care provider about which screenings and vaccines you need and how often you need them. This information is not intended to replace advice given to you by your health care provider. Make sure you discuss any questions you have with your health care provider. Document Released: 07/01/2015 Document Revised: 02/22/2016 Document Reviewed: 04/05/2015 Elsevier Interactive Patient Education  2017 [place] Prevention in the Home Falls can cause injuries. They can happen to people of all ages. There are many things you can do to make your home safe and to help prevent falls. What can I do on the outside of my home?  Regularly fix the edges of walkways and driveways and fix any cracks.  Remove anything that might make you trip as you walk through a door, such as a raised step or threshold.  Trim any bushes or trees on the path to your home.  Use bright outdoor lighting.  Clear any walking paths of anything that might make someone trip, such as rocks or tools.  Regularly check to see if handrails are loose or broken. Make sure that both sides of any steps have handrails.  Any raised decks and porches should have guardrails on the edges.  Have any leaves, snow, or ice cleared regularly.  Use sand or salt on walking paths during winter.  Clean up any spills in your garage right away. This includes oil or grease spills. What can I do in the bathroom?  Use night lights.  Install grab bars by the toilet and in the tub and shower. Do not use towel bars as grab bars.  Use non-skid mats or decals in the tub or shower.  If you need to sit down in the shower, use  a plastic, non-slip stool.  Keep the floor dry. Clean up any water that spills on the floor as soon as it happens.  Remove soap buildup in the tub or shower regularly.  Attach bath mats securely with double-sided non-slip rug tape.  Do not have throw rugs and other things on the floor that can make you trip. What can I do in the bedroom?  Use night lights.  Make sure that you have a light by your bed that is easy to reach.  Do not use any sheets or blankets that are too big for your bed. They should not hang down onto the floor.  Have a firm chair that has side arms. You can use this for support while you get dressed.  Do not have throw rugs and other things on the floor that can make you trip. What can I do in the kitchen?  Clean up any spills right away.  Avoid walking on wet floors.  Keep items that you use a lot in easy-to-reach places.  If you need to reach something above you, use a strong step stool that has a grab bar.  Keep electrical cords out of the way.  Do not use floor polish or wax that makes floors slippery. If you must use wax, use non-skid floor wax.  Do not have throw rugs and other things on the floor that can make you trip. What can I do with my stairs?  Do not leave any items on the stairs.  Make sure that there are handrails on both sides of the stairs and use them. Fix handrails that are broken or loose. Make sure that handrails are as long as the stairways.  Check any carpeting to make sure that it is firmly attached to the stairs. Fix any carpet that is loose or worn.  Avoid having throw rugs at the top or bottom of the stairs. If you do have throw rugs, attach them to the floor with carpet tape.  Make sure that you have a light switch at the top of the stairs and the bottom of the stairs. If you do not have them, ask someone to add them for you. What else can I do to help prevent falls?  Wear shoes that:  Do not have high heels.  Have  rubber bottoms.  Are comfortable and fit you well.  Are closed at the toe. Do not wear sandals.  If you use a stepladder:  Make sure that it is fully opened. Do not climb a closed stepladder.  Make sure that both sides of the stepladder are locked into place.  Ask someone to hold it for you, if possible.  Clearly mark and make sure that you can see:  Any grab bars or handrails.  First and last steps.  Where the edge of each step is.  Use tools that help you move around (mobility aids) if they are needed. These include:  Canes.  Walkers.  Scooters.  Crutches.  Turn on the lights when you go into a dark area. Replace any light bulbs as soon as they burn out.  Set up your furniture so you have a clear path. Avoid moving your furniture around.  If any of your floors are uneven, fix them.  If there are any pets around you, be aware of where they are.  Review your medicines with your doctor. Some medicines can make you feel dizzy. This can increase your chance of falling. Ask your doctor what other things that you can do to help prevent falls. This information is not intended to replace advice given to you by your health care provider. Make sure you discuss any questions you have with your health care provider. Document Released: 03/31/2009 Document Revised: 11/10/2015 Document Reviewed: 07/09/2014 Elsevier Interactive Patient Education  2017 [employer].

## 2020-12-27 ENCOUNTER — Encounter: Payer: PPO | Admitting: Family Medicine

## 2021-01-16 ENCOUNTER — Other Ambulatory Visit: Payer: Self-pay

## 2021-01-16 DIAGNOSIS — G629 Polyneuropathy, unspecified: Secondary | ICD-10-CM

## 2021-01-16 MED ORDER — GABAPENTIN 300 MG PO CAPS: ORAL_CAPSULE | ORAL | 1 refills | Status: DC

## 2021-02-02 ENCOUNTER — Other Ambulatory Visit: Payer: Self-pay

## 2021-02-02 ENCOUNTER — Encounter: Payer: Self-pay | Admitting: Nurse Practitioner

## 2021-02-02 ENCOUNTER — Ambulatory Visit (INDEPENDENT_AMBULATORY_CARE_PROVIDER_SITE_OTHER): Payer: PPO | Admitting: Nurse Practitioner

## 2021-02-02 ENCOUNTER — Encounter: Payer: PPO | Admitting: Family Medicine

## 2021-02-02 VITALS — BP 119/63 | HR 58 | Temp 98.2°F | Ht 72.0 in | Wt 178.0 lb

## 2021-02-02 DIAGNOSIS — N400 Enlarged prostate without lower urinary tract symptoms: Secondary | ICD-10-CM

## 2021-02-02 DIAGNOSIS — D649 Anemia, unspecified: Secondary | ICD-10-CM

## 2021-02-02 DIAGNOSIS — Z0001 Encounter for general adult medical examination with abnormal findings: Secondary | ICD-10-CM | POA: Diagnosis not present

## 2021-02-02 DIAGNOSIS — I10 Essential (primary) hypertension: Secondary | ICD-10-CM | POA: Diagnosis not present

## 2021-02-02 DIAGNOSIS — R001 Bradycardia, unspecified: Secondary | ICD-10-CM | POA: Diagnosis not present

## 2021-02-02 DIAGNOSIS — R011 Cardiac murmur, unspecified: Secondary | ICD-10-CM

## 2021-02-02 DIAGNOSIS — E782 Mixed hyperlipidemia: Secondary | ICD-10-CM | POA: Diagnosis not present

## 2021-02-02 NOTE — Patient Instructions (Addendum)
Please have fasting labs drawn in the next 7 days.  We will meet up in about 6 months for lab follow-up. Please have fasting labs drawn 2-3 days prior to your appointment so we can discuss the results during your office visit.

## 2021-02-02 NOTE — Assessment & Plan Note (Signed)
BP Readings from Last 3 Encounters:  02/02/21 119/63  11/02/20 126/64  05/20/20 122/70   -well-controlled; no change to HCTZ

## 2021-02-02 NOTE — Assessment & Plan Note (Signed)
-  checking labs 

## 2021-02-02 NOTE — Assessment & Plan Note (Signed)
-  HR 58 today

## 2021-02-02 NOTE — Progress Notes (Signed)
Established Patient Office Visit  Subjective:  Patient ID: Charles Ayala, male    DOB: 1951-02-19  Age: 70 y.o. MRN: [id number]  CC:  Chief Complaint  Patient presents with   Annual Exam    CPE    HPI Charles Ayala presents for physical exam. No recent labs; medical history as below.  He has mild aortic stenosis and significant PAD, and his LDL goal is < 70.  He has some pain at the back of his neck that has bene ongoing "for months at least if not longer".   Past Medical History:  Diagnosis Date   Anemia    Arthritis    Bug bite 09/02/2019   Encounter for hepatitis C screening test for low risk patient 07/31/2017   Hammer toe of left foot 07/31/2017   Heart murmur    High risk medication use 07/31/2017   History of stomach ulcers    Hyperlipidemia    Hyperlipidemia LDL goal <100 07/31/2017   Hypertension    Nausea without vomiting 07/22/2017   Neuropathy    Spondylolysis of lumbar region 02/26/2017   Tenosynovitis of thumb 08/16/2019    Past Surgical History:  Procedure Laterality Date   BACK SURGERY  1997   L4 L5   CARPAL TUNNEL RELEASE Right    COLONOSCOPY  02/2009   Dr. Laural Golden: few sigmoid diverticula, one at cecum, small submucosal lipoma at prox transverse colon   COLONOSCOPY WITH PROPOFOL N/A 08/22/2017   Dr. Gala Romney: two tubular adenomas, sigmoid diverticulosis, colonic lipoma   ESOPHAGOGASTRODUODENOSCOPY (EGD) WITH PROPOFOL N/A 08/22/2017   Dr. Gala Romney: normal esophagus, normal stomach, normal duodenum   POLYPECTOMY  08/22/2017   Procedure: POLYPECTOMY;  Surgeon: Daneil Dolin, MD;  [facility]: [facility];  Service: Endoscopy;;  sigmoid    Family History  Problem Relation Age of Onset   Parkinson's disease Mother    Hypertension Mother    Hypertension Father    Cancer Sister        Chemo related lung problems   Diabetes Son    Colon cancer Neg Hx    Liver disease Neg Hx    Pancreatic cancer Neg Hx     Social History   Socioeconomic History   Marital  status: Married    Spouse name: Geologist, engineering   Number of children: 4   Years of education: Not on file   Highest education level: Associate degree: occupational, Hotel manager, or vocational program  Occupational History   Occupation: retired  Tobacco Use   Smoking status: Never   Smokeless tobacco: Never  Vaping Use   Vaping Use: Never used  Substance and Sexual Activity   Alcohol use: No   Drug use: No   Sexual activity: Yes  Other Topics Concern   Not on file  Social History Narrative   Retired. Used to work at [employer].       Married 42 years    Lives in [place].   Has four children. 15 grandchildren, 5 great grandchildren       Enjoys gardening and playing with grandchildren.       Diet: veggies, limited red meat, most chicken and fish, fruits   Caffeine: 2 cups of coffee, no sodas   Water: 6-8 cups       Wears seat belt    Smoke detectors at home   Does not use phone while driving   Social Determinants of Health   Financial Resource Strain: Low Risk    Difficulty of  Paying Living Expenses: Not hard at all  Food Insecurity: No Food Insecurity   Worried About [place] in the Last Year: Never true   Ran Out of Food in the Last Year: Never true  Transportation Needs: No Transportation Needs   Lack of Transportation (Medical): No   Lack of Transportation (Non-Medical): No  Physical Activity: Sufficiently Active   Days of Exercise per Week: 7 days   Minutes of Exercise per Session: 30 min  Stress: Not on file  Social Connections: Moderately Isolated   Frequency of Communication with Friends and Family: Three times a week   Frequency of Social Gatherings with Friends and Family: Three times a week   Attends Religious Services: Never   Active Member of Clubs or Organizations: No   Attends Archivist Meetings: Never   Marital Status: Married  Human resources officer Violence: Not At Risk   Fear of Current or Ex-Partner: No   Emotionally Abused: No    Physically Abused: No   Sexually Abused: No    Outpatient Medications Prior to Visit  Medication Sig Dispense Refill   gabapentin (NEURONTIN) 300 MG capsule TAKE 2 PILLS IN THE AM, 1 PILL IN THE AFTERNOON, AND 2 PILLS IN THE EVENING 450 capsule 1   hydrochlorothiazide (HYDRODIURIL) 25 MG tablet Take 1 tablet (25 mg total) by mouth daily. 90 tablet 1   rosuvastatin (CRESTOR) 20 MG tablet Take 20 mg by mouth daily.     tamsulosin (FLOMAX) 0.4 MG CAPS capsule Take 1 capsule (0.4 mg total) daily by mouth. 90 capsule 3   rosuvastatin (CRESTOR) 20 MG tablet Take 1 tablet (20 mg total) by mouth daily. 90 tablet 3   meloxicam (MOBIC) 7.5 MG tablet TAKE 1 TABLET BY MOUTH EVERY DAY AS NEEDED FOR PAIN (Patient not taking: Reported on 02/02/2021) 30 tablet 0   No facility-administered medications prior to visit.    No Known Allergies  ROS Review of Systems  Constitutional: Negative.   HENT:  Positive for hearing loss. Negative for congestion, ear pain, postnasal drip, rhinorrhea, sinus pressure, sinus pain and sore throat.   Eyes: Negative.   Respiratory: Negative.    Cardiovascular: Negative.   Gastrointestinal: Negative.   Endocrine: Negative.   Genitourinary: Negative.   Musculoskeletal: Negative.   Skin: Negative.   Allergic/Immunologic: Negative.   Neurological: Negative.   Hematological: Negative.   Psychiatric/Behavioral: Negative.       Objective:    Physical Exam Constitutional:      Appearance: Normal appearance.  HENT:     Head: Normocephalic and atraumatic.     Right Ear: Tympanic membrane, ear canal and external ear normal.     Left Ear: Tympanic membrane, ear canal and external ear normal.     Nose: Nose normal.     Mouth/Throat:     Mouth: Mucous membranes are moist.     Pharynx: Oropharynx is clear.  Eyes:     Extraocular Movements: Extraocular movements intact.     Conjunctiva/sclera: Conjunctivae normal.     Pupils: Pupils are equal, round, and reactive to  light.  Cardiovascular:     Rate and Rhythm: Regular rhythm. Bradycardia present.     Pulses: Normal pulses.     Heart sounds: Murmur heard.  Pulmonary:     Effort: Pulmonary effort is normal.     Breath sounds: Normal breath sounds.  Abdominal:     General: Abdomen is flat. Bowel sounds are normal. There is no distension.  Palpations: Abdomen is soft. There is no mass.     Tenderness: There is no abdominal tenderness. There is no guarding or rebound.     Hernia: No hernia is present.  Musculoskeletal:        General: Normal range of motion.     Cervical back: Normal range of motion and neck supple.  Skin:    General: Skin is warm and dry.     Capillary Refill: Capillary refill takes less than 2 seconds.  Neurological:     General: No focal deficit present.     Mental Status: He is alert and oriented to person, place, and time.     Cranial Nerves: No cranial nerve deficit.     Sensory: No sensory deficit.     Motor: No weakness.     Coordination: Coordination normal.     Gait: Gait normal.  Psychiatric:        Mood and Affect: Mood normal.        Behavior: Behavior normal.        Thought Content: Thought content normal.        Judgment: Judgment normal.    BP 119/63 (BP Location: Left Arm, Patient Position: Sitting, Cuff Size: Large)   Pulse (!) 58   Temp 98.2 F (36.8 C) (Oral)   Ht 6' (1.829 m)   Wt 178 lb (80.7 kg)   SpO2 97%   BMI 24.14 kg/m  Wt Readings from Last 3 Encounters:  02/02/21 178 lb (80.7 kg)  11/02/20 177 lb 12 oz (80.6 kg)  05/20/20 179 lb (81.2 kg)     Health Maintenance Due  Topic Date Due   Zoster Vaccines- Shingrix (1 of 2) Never done    There are no preventive care reminders to display for this patient.  Lab Results  Component Value Date   TSH 0.60 11/17/2019   Lab Results  Component Value Date   WBC 5.5 05/24/2020   HGB 12.8 (L) 05/24/2020   HCT 38.5 05/24/2020   MCV 87 05/24/2020   PLT 229 05/24/2020   Lab Results   Component Value Date   NA 141 05/24/2020   K 3.8 05/24/2020   CO2 25 05/24/2020   GLUCOSE 91 05/24/2020   BUN 11 05/24/2020   CREATININE 0.88 05/24/2020   BILITOT 0.8 05/24/2020   ALKPHOS 84 05/24/2020   AST 18 05/24/2020   ALT 12 05/24/2020   PROT 7.3 05/24/2020   ALBUMIN 4.6 05/24/2020   CALCIUM 9.5 05/24/2020   ANIONGAP 7 12/31/2017   Lab Results  Component Value Date   CHOL 172 11/17/2019   Lab Results  Component Value Date   HDL 57 11/17/2019   Lab Results  Component Value Date   LDLCALC 101 (H) 11/17/2019   Lab Results  Component Value Date   TRIG 56 11/17/2019   Lab Results  Component Value Date   CHOLHDL 3.0 11/17/2019   Lab Results  Component Value Date   HGBA1C 5.9 (A) 11/12/2019   HGBA1C 5.9 11/12/2019   HGBA1C 5.9 11/12/2019   HGBA1C 5.9 11/12/2019      Assessment & Plan:   Problem List Items Addressed This Visit       Cardiovascular and Mediastinum   Essential hypertension    BP Readings from Last 3 Encounters:  02/02/21 119/63  11/02/20 126/64  05/20/20 122/70  -well-controlled; no change to HCTZ      Relevant Medications   rosuvastatin (CRESTOR) 20 MG tablet   Other Relevant Orders  CBC with Differential/Platelet   CMP14+EGFR   Lipid Panel With LDL/HDL Ratio   CBC with Differential/Platelet   CMP14+EGFR   Lipid Panel With LDL/HDL Ratio     Genitourinary   Benign prostatic hyperplasia without lower urinary tract symptoms   Relevant Orders   PSA     Other   Normocytic anemia    -checking labs      Relevant Orders   CBC with Differential/Platelet   Annual visit for general adult medical examination with abnormal findings    -physical exam unremarkable      Heart murmur    -followed by cardiology      Mixed hyperlipidemia    -checking labs      Relevant Medications   rosuvastatin (CRESTOR) 20 MG tablet   Other Relevant Orders   CBC with Differential/Platelet   CMP14+EGFR   Lipid Panel With LDL/HDL Ratio    Bradycardia    -HR 58 today      Other Visit Diagnoses     Encounter for general adult medical examination with abnormal findings    -  Primary   Relevant Orders   CBC with Differential/Platelet   CMP14+EGFR   Lipid Panel With LDL/HDL Ratio   PSA   TSH       No orders of the defined types were placed in this encounter.   Follow-up: Return in about 6 months (around 08/05/2021) for Lab follow-up (HTN, HLD).    Noreene Larsson, NP

## 2021-02-02 NOTE — Assessment & Plan Note (Signed)
-  physical exam unremarkable 

## 2021-02-02 NOTE — Assessment & Plan Note (Signed)
-  followed by cardiology 

## 2021-02-03 DIAGNOSIS — Z0001 Encounter for general adult medical examination with abnormal findings: Secondary | ICD-10-CM | POA: Diagnosis not present

## 2021-02-03 DIAGNOSIS — N400 Enlarged prostate without lower urinary tract symptoms: Secondary | ICD-10-CM | POA: Diagnosis not present

## 2021-02-03 DIAGNOSIS — I10 Essential (primary) hypertension: Secondary | ICD-10-CM | POA: Diagnosis not present

## 2021-02-03 DIAGNOSIS — D649 Anemia, unspecified: Secondary | ICD-10-CM | POA: Diagnosis not present

## 2021-02-04 LAB — LIPID PANEL WITH LDL/HDL RATIO
Cholesterol, Total: 171 mg/dL (ref 100–199)
HDL: 61 mg/dL (ref >39)
LDL Chol Calc (NIH): 98 mg/dL (ref 0–99)
LDL/HDL Ratio: 1.6 ratio (ref 0.0–3.6)
Triglycerides: 61 mg/dL (ref 0–149)
VLDL Cholesterol Cal: 12 mg/dL (ref 5–40)

## 2021-02-04 LAB — CBC WITH DIFFERENTIAL/PLATELET
Basophils Absolute: 0 10*3/uL (ref 0.0–0.2)
Basos: 1 %
EOS (ABSOLUTE): 0.2 10*3/uL (ref 0.0–0.4)
Eos: 5 %
Hematocrit: 40.6 % (ref 37.5–51.0)
Hemoglobin: 13 g/dL (ref 13.0–17.7)
Immature Grans (Abs): 0 10*3/uL (ref 0.0–0.1)
Immature Granulocytes: 0 %
Lymphocytes Absolute: 1.9 10*3/uL (ref 0.7–3.1)
Lymphs: 38 %
MCH: 28.9 pg (ref 26.6–33.0)
MCHC: 32 g/dL (ref 31.5–35.7)
MCV: 90 fL (ref 79–97)
Monocytes Absolute: 0.4 10*3/uL (ref 0.1–0.9)
Monocytes: 8 %
Neutrophils Absolute: 2.5 10*3/uL (ref 1.4–7.0)
Neutrophils: 48 %
Platelets: 195 10*3/uL (ref 150–450)
RBC: 4.5 x10E6/uL (ref 4.14–5.80)
RDW: 13.2 % (ref 11.6–15.4)
WBC: 5.1 10*3/uL (ref 3.4–10.8)

## 2021-02-04 LAB — CMP14+EGFR
ALT: 10 IU/L (ref 0–44)
AST: 24 IU/L (ref 0–40)
Albumin/Globulin Ratio: 1.7 (ref 1.2–2.2)
Albumin: 4.4 g/dL (ref 3.8–4.8)
Alkaline Phosphatase: 84 IU/L (ref 44–121)
BUN/Creatinine Ratio: 18 (ref 10–24)
BUN: 17 mg/dL (ref 8–27)
Bilirubin Total: 0.8 mg/dL (ref 0.0–1.2)
CO2: 26 mmol/L (ref 20–29)
Calcium: 9.3 mg/dL (ref 8.6–10.2)
Chloride: 100 mmol/L (ref 96–106)
Creatinine, Ser: 0.95 mg/dL (ref 0.76–1.27)
Globulin, Total: 2.6 g/dL (ref 1.5–4.5)
Glucose: 79 mg/dL (ref 65–99)
Potassium: 4.6 mmol/L (ref 3.5–5.2)
Sodium: 142 mmol/L (ref 134–144)
Total Protein: 7 g/dL (ref 6.0–8.5)
eGFR: 86 mL/min/{1.73_m2} (ref >59)

## 2021-02-04 LAB — PSA: Prostate Specific Ag, Serum: 1.4 ng/mL (ref 0.0–4.0)

## 2021-02-04 LAB — TSH: TSH: 0.83 u[IU]/mL (ref 0.450–4.500)

## 2021-02-06 NOTE — Progress Notes (Signed)
Labs look great.

## 2021-03-23 ENCOUNTER — Ambulatory Visit: Payer: PPO | Admitting: Cardiology

## 2021-03-27 NOTE — Progress Notes (Signed)
Cardiology Office Note  Date: 03/28/2021   ID: Charles Ayala, DOB 05-19-1951, MRN [id number]  PCP:  Noreene Larsson, NP  Cardiologist:  Rozann Lesches, MD Electrophysiologist:  None   Chief Complaint: 1 year follow-up  History of Present Illness: Charles Ayala is a 70 y.o. male with a history of bradycardia, heart murmur,  Mild AS and MR, PAD (moderate to severe on the right, mild on left), Carotid artery disease( RICA 41-28%, LICA 7-86%)  Saw me on 05/27/2019 admitted to mild claudication in right calf when ambulating but quickly resolved. He had an upcoming repeat carotid study. BP was elevated. He was continuing his HCTZ  25 mg daily.  Patient has been doing well.  He was referred here by his new provider due to a slow heart rate and a heart murmur.  He is asymptomatic.  He denies any orthostatic symptoms/presyncope/syncopal episodes., palpitations or arrhythmias, denies any progressive anginal or exertional symptoms.  States he works out in his garden every day.  Denies any PND or orthopnea.  Denies any lower extremity edema.  He had mild aortic stenosis and moderate MR on echo in 2019.  He was seen by Dr. Fletcher Anon on 05/10/2020 for peripheral arterial disease with mild right calf claudication.  Vascular testing in March 2019 showed moderately reduced ABI on the right of 0.5 with evidence of severe right SFA stenosis.  No significant disease on the left.  He denies any claudication at that time.  He denied any chest pain, shortness of breath, or palpitations.  He was taking his medications regularly.  He was to continue medical therapy.  His mild aortic stenosis and moderate mitral regurgitation was followed by Dr. Domenic Polite.  Blood pressure was well controlled.  He was currently on simvastatin and Zetia with most recent LDL of 101.  Dr. Fletcher Anon planned to rechallenge him with Crestor 20 mg daily to replace simvastatin to see if LDL could be reduced to lower than 70.  Plan was to repeat lipid and  liver profile in 2 months.  His most recent carotid artery Doppler study in December 2020 showed mild less than 40% ICA stenosis bilaterally.  Is here today for 1 year follow-up.  Denies any current anginal symptoms, exertional symptoms, palpitations, arrhythmias, orthostatic symptoms, CVA or TIA-like symptoms, PND, orthopnea, bleeding, claudication, DVT or PE-like symptoms, or lower extremity edema.  Blood pressure is elevated today.  Usually blood pressure is normal.  The last 2 visits in epic show blood pressure 767-209 systolic and 47-09 diastolic pressures.  He denies any claudication-like symptoms.  He is taking all his medications as directed.  Recent lipid panel on February 03, 2021 showed total cholesterol 171, HDL 61, triglycerides 61, LDL 98.  Current cardiac regimen includes; Crestor 20 mg daily, HCTZ 25 mg daily.   Past Medical History:  Diagnosis Date   Anemia    Arthritis    Bug bite 09/02/2019   Encounter for hepatitis C screening test for low risk patient 07/31/2017   Hammer toe of left foot 07/31/2017   Heart murmur    High risk medication use 07/31/2017   History of stomach ulcers    Hyperlipidemia    Hyperlipidemia LDL goal <100 07/31/2017   Hypertension    Nausea without vomiting 07/22/2017   Neuropathy    Spondylolysis of lumbar region 02/26/2017   Tenosynovitis of thumb 08/16/2019    Past Surgical History:  Procedure Laterality Date   BACK SURGERY  1997   L4 L5  CARPAL TUNNEL RELEASE Right    COLONOSCOPY  02/2009   Dr. Laural Golden: few sigmoid diverticula, one at cecum, small submucosal lipoma at prox transverse colon   COLONOSCOPY WITH PROPOFOL N/A 08/22/2017   Dr. Gala Romney: two tubular adenomas, sigmoid diverticulosis, colonic lipoma   ESOPHAGOGASTRODUODENOSCOPY (EGD) WITH PROPOFOL N/A 08/22/2017   Dr. Gala Romney: normal esophagus, normal stomach, normal duodenum   POLYPECTOMY  08/22/2017   Procedure: POLYPECTOMY;  Surgeon: Daneil Dolin, MD;  Location: [facility];  Service:  Endoscopy;;  sigmoid    Current Outpatient Medications  Medication Sig Dispense Refill   gabapentin (NEURONTIN) 300 MG capsule TAKE 2 PILLS IN THE AM, 1 PILL IN THE AFTERNOON, AND 2 PILLS IN THE EVENING 450 capsule 1   hydrochlorothiazide (HYDRODIURIL) 25 MG tablet Take 1 tablet (25 mg total) by mouth daily. 90 tablet 1   rosuvastatin (CRESTOR) 20 MG tablet Take 20 mg by mouth daily.     tamsulosin (FLOMAX) 0.4 MG CAPS capsule Take 1 capsule (0.4 mg total) daily by mouth. 90 capsule 3   No current facility-administered medications for this visit.   Allergies:  Patient has no known allergies.   Social History: The patient  reports that he has never smoked. He has never used smokeless tobacco. He reports that he does not drink alcohol and does not use drugs.   Family History: The patient's family history includes Cancer in his sister; Diabetes in his son; Hypertension in his father and mother; Parkinson's disease in his mother.   ROS:  Please see the history of present illness. Otherwise, complete review of systems is positive for none.  All other systems are reviewed and negative.   Physical Exam: VS:  BP 140/70   Pulse (!) 53   Ht 6' (1.829 m)   Wt 177 lb 6.4 oz (80.5 kg)   SpO2 98%   BMI 24.06 kg/m , BMI Body mass index is 24.06 kg/m.  Wt Readings from Last 3 Encounters:  03/28/21 177 lb 6.4 oz (80.5 kg)  02/02/21 178 lb (80.7 kg)  11/02/20 177 lb 12 oz (80.6 kg)    General: Patient appears comfortable at rest. Neck: Supple, no elevated JVP or carotid bruits, no thyromegaly. Lungs: Clear to auscultation, nonlabored breathing at rest. Cardiac: Regular rate and rhythm, no S3 or significant systolic murmur, no pericardial rub. Extremities: No pitting edema, distal pulses 2+. Skin: Warm and dry. Musculoskeletal: No kyphosis. Neuropsychiatric: Alert and oriented x3, affect grossly appropriate.  ECG:  EKG on Nov 12, 2019 showed heart rate of 44 sinus bradycardia, left axis  deviation.  Recent Labwork: 02/03/2021: ALT 10; AST 24; BUN 17; Creatinine, Ser 0.95; Hemoglobin 13.0; Platelets 195; Potassium 4.6; Sodium 142; TSH 0.830     Component Value Date/Time   CHOL 171 02/03/2021 0824   TRIG 61 02/03/2021 0824   HDL 61 02/03/2021 0824   CHOLHDL 3.0 11/17/2019 0809   LDLCALC 98 02/03/2021 0824   LDLCALC 101 (H) 11/17/2019 0809    Other Studies Reviewed Today:  Lower extremity arterial duplex 08/30/2017 Final Interpretation:  Right: Resting right ankle-brachial index indicates moderate right lower  extremity arterial disease. The right toe-brachial index is abnormal. PPG  tracings appear dampened.  Left: Resting left ankle-brachial index is within normal range. No  evidence of significant left lower extremity arterial disease. The left  toe-brachial index is normal. PPG tracings appear dampened, except third  toe.   See Arterial Duplex study.    Final Interpretation:  Right: 30-49% disease  without focal stenosis noted in the common         femoral artery. 75-99% stenosis noted in the distal         superficial femoral artery. Occluded tibial-peroneal trunk.  Left: 30-49% disease without focal stenosis noted in the common        femoral artery. 30-49% disease without focal stenosis noted in        the superficial femoral artery. 50-74% stenosis noted in the        anterior tibial artery.     Carotid artery duplex 06/04/2019  Right Carotid: Velocities in the right ICA are consistent with a 1-39% stenosis. Left Carotid: Velocities in the left ICA are consistent with a 1-39% stenosis. Vertebrals: Bilateral vertebral arteries demonstrate antegrade flow. Subclavians: Normal flow hemodynamics were seen in bilateral subclavian arteries  Carotid Dopplers 03/12/2018: Final Interpretation:  Right Carotid: Velocities in the right ICA are consistent with a 40-59%                 stenosis.     Left Carotid: Velocities in the left ICA are consistent with a  1-39% stenosis.     Vertebrals:  Bilateral vertebral arteries demonstrate antegrade flow.  Subclavians: Normal flow hemodynamics were seen in bilateral subclavian               arteries.  Echocardiogram dated June 17, 2018 Study Conclusions   - Left ventricle: The cavity size was normal. Wall thickness was   increased in a pattern of mild LVH. Systolic function was normal.   The estimated ejection fraction was in the range of 60% to 65%.   Wall motion was normal; there were no regional wall motion   abnormalities. Findings consistent with left ventricular   diastolic dysfunction, grade indeterminate. - Aortic valve: Trileaflet; mildly thickened, mildly calcified   leaflets. There was mild stenosis. Valve area (VTI): 1.64 cm^2.   Valve area (Vmax): 1.59 cm^2. Valve area (Vmean): 1.59 cm^2. - Aorta: Aortic root dimension: 37 mm (ED). - Mitral valve: Mildly calcified annulus. Mildly thickened leaflets   . There was moderate regurgitation. Mild posterior leaflet   prolapse. - Left atrium: The atrium was moderately dilated. - Tricuspid valve: There was mild regurgitation. - Pulmonic valve: There was mild regurgitation  Assessment and Plan:  1. Heart rate slow   2. Heart murmur   3. Bilateral carotid artery stenosis   4. PAD (peripheral artery disease) ([place])   5. Mixed hyperlipidemia     1. Heart rate slow Heart rate today is 53.  Patient has an inherently slow heart rate.  2. Heart murmur He has a mild 2/6 murmur heard at left lower sternal border.  He does have moderate MR and mild aortic stenosis on echo in December 2019.  3. Bilateral carotid artery stenosis Last carotid artery duplex study showed 1 to 39% RICA and 1 to 44% LICA 9675  4. PAD (peripheral artery disease) ([facility]) Moderate right lower extremity disease.  Left ABI normal 08/2017.  Patient denies any symptoms of claudication.  5. Hyperlipidemia, unspecified hyperlipidemia type Recent lipid panel on 11/17/2019  showed total cholesterol 172, HDL 57, triglycerides 56, LDL 101.  Continue simvastatin 20 mg and Zetia 10 mg daily.  6.  Hypertension Blood pressure today is 140/78.  Blood pressure is usually in the 120s over 60s range.  Continue HCTZ 25 mg daily.  Medication Adjustments/Labs and Tests Ordered: Current medicines are reviewed at length with the patient today.  Concerns regarding  medicines are outlined above.   Disposition: Follow-up with Dr. Domenic Polite or [facility] 1 year  Signed, Levell July, NP 03/28/2021 3:28 PM    [facility] [place], [place], [place] [address] Phone: [phone]; Fax: [phone]

## 2021-03-28 ENCOUNTER — Ambulatory Visit: Payer: PPO | Admitting: Family Medicine

## 2021-03-28 ENCOUNTER — Encounter: Payer: Self-pay | Admitting: Family Medicine

## 2021-03-28 VITALS — BP 140/70 | HR 53 | Ht 72.0 in | Wt 177.4 lb

## 2021-03-28 DIAGNOSIS — E782 Mixed hyperlipidemia: Secondary | ICD-10-CM

## 2021-03-28 DIAGNOSIS — R001 Bradycardia, unspecified: Secondary | ICD-10-CM

## 2021-03-28 DIAGNOSIS — R011 Cardiac murmur, unspecified: Secondary | ICD-10-CM | POA: Diagnosis not present

## 2021-03-28 DIAGNOSIS — I739 Peripheral vascular disease, unspecified: Secondary | ICD-10-CM | POA: Diagnosis not present

## 2021-03-28 DIAGNOSIS — I6523 Occlusion and stenosis of bilateral carotid arteries: Secondary | ICD-10-CM

## 2021-03-28 NOTE — Patient Instructions (Signed)

## 2021-05-10 ENCOUNTER — Other Ambulatory Visit: Payer: Self-pay | Admitting: Cardiovascular Disease

## 2021-05-10 NOTE — Telephone Encounter (Signed)
Refill Request.  

## 2021-05-16 ENCOUNTER — Encounter: Payer: Self-pay | Admitting: Cardiovascular Disease

## 2021-05-16 ENCOUNTER — Ambulatory Visit (INDEPENDENT_AMBULATORY_CARE_PROVIDER_SITE_OTHER): Payer: PPO | Admitting: Cardiovascular Disease

## 2021-05-16 ENCOUNTER — Other Ambulatory Visit: Payer: Self-pay

## 2021-05-16 VITALS — BP 130/68 | HR 50 | Ht 72.0 in | Wt 179.0 lb

## 2021-05-16 DIAGNOSIS — I739 Peripheral vascular disease, unspecified: Secondary | ICD-10-CM | POA: Diagnosis not present

## 2021-05-16 DIAGNOSIS — I779 Disorder of arteries and arterioles, unspecified: Secondary | ICD-10-CM

## 2021-05-16 DIAGNOSIS — I10 Essential (primary) hypertension: Secondary | ICD-10-CM | POA: Diagnosis not present

## 2021-05-16 DIAGNOSIS — E785 Hyperlipidemia, unspecified: Secondary | ICD-10-CM

## 2021-05-16 MED ORDER — ASPIRIN EC 81 MG PO TBEC: 81.0000 mg | DELAYED_RELEASE_TABLET | Freq: Every day | ORAL | 3 refills | Status: DC

## 2021-05-16 NOTE — Progress Notes (Signed)
Cardiology Office Note   Date:  05/16/2021   ID:  Charles Ayala, DOB 05/24/1951, MRN [id number]  PCP:  Noreene Larsson, NP  Cardiologist: Dr. Domenic Polite  Chief Complaint  Patient presents with   Follow-up    12 months.       History of Present Illness: Charles Ayala is a 70 y.o. male who is here today for follow-up visit regarding peripheral arterial disease.  He has known history of mild aortic stenosis, moderate mitral regurgitation,essential hypertension, hyperlipidemia and chronic back pain.  He is not diabetic and does not smoke.  Family history is negative for coronary artery disease.   He is being followed for peripheral arterial disease with mild right calf claudication. Vascular testing in March of 2019 showed moderately reduced ABI on the right and 0.5 range with evidence of severe right SFA stenosis.  No significant disease on the left side. He continues to deny any claudication.  He walks on a regular basis with no issues.  He reports stable mild exertional dyspnea without chest pain.  He has chronic bradycardia but overall is asymptomatic.  Past Medical History:  Diagnosis Date   Anemia    Arthritis    Bug bite 09/02/2019   Encounter for hepatitis C screening test for low risk patient 07/31/2017   Hammer toe of left foot 07/31/2017   Heart murmur    High risk medication use 07/31/2017   History of stomach ulcers    Hyperlipidemia    Hyperlipidemia LDL goal <100 07/31/2017   Hypertension    Nausea without vomiting 07/22/2017   Neuropathy    Spondylolysis of lumbar region 02/26/2017   Tenosynovitis of thumb 08/16/2019    Past Surgical History:  Procedure Laterality Date   BACK SURGERY  1997   L4 L5   CARPAL TUNNEL RELEASE Right    COLONOSCOPY  02/2009   Dr. Laural Golden: few sigmoid diverticula, one at cecum, small submucosal lipoma at prox transverse colon   COLONOSCOPY WITH PROPOFOL N/A 08/22/2017   Dr. Gala Romney: two tubular adenomas, sigmoid diverticulosis, colonic lipoma    ESOPHAGOGASTRODUODENOSCOPY (EGD) WITH PROPOFOL N/A 08/22/2017   Dr. Gala Romney: normal esophagus, normal stomach, normal duodenum   POLYPECTOMY  08/22/2017   Procedure: POLYPECTOMY;  Surgeon: Daneil Dolin, MD;  [facility];  Service: Endoscopy;;  sigmoid     Current Outpatient Medications  Medication Sig Dispense Refill   aspirin EC 81 MG tablet Take 1 tablet (81 mg total) by mouth daily. Swallow whole. 90 tablet 3   gabapentin (NEURONTIN) 300 MG capsule TAKE 2 PILLS IN THE AM, 1 PILL IN THE AFTERNOON, AND 2 PILLS IN THE EVENING 450 capsule 1   hydrochlorothiazide (HYDRODIURIL) 25 MG tablet Take 1 tablet (25 mg total) by mouth daily. 90 tablet 1   rosuvastatin (CRESTOR) 20 MG tablet TAKE 1 TABLET BY MOUTH EVERY DAY 90 tablet 3   tamsulosin (FLOMAX) 0.4 MG CAPS capsule Take 1 capsule (0.4 mg total) daily by mouth. 90 capsule 3   No current facility-administered medications for this visit.    Allergies:   Patient has no known allergies.    Social History:  The patient  reports that he has never smoked. He has never used smokeless tobacco. He reports that he does not drink alcohol and does not use drugs.   Family History:  The patient's family history includes Cancer in his sister; Diabetes in his son; Hypertension in his father and mother; Parkinson's disease in his mother.  ROS:  Please see the history of present illness.   Otherwise, review of systems are positive for none.   All other systems are reviewed and negative.    PHYSICAL EXAM: VS:  BP 130/68 (BP Location: Right Arm, Patient Position: Sitting, Cuff Size: Normal)   Pulse (!) 50   Ht 6' (1.829 m)   Wt 179 lb (81.2 kg)   BMI 24.28 kg/m  , BMI Body mass index is 24.28 kg/m. GEN: Well nourished, well developed, in no acute distress  HEENT: normal  Neck: no JVD, or masses.  Faint bilateral carotid bruits Cardiac: RRR; no  rubs, or gallops,no edema .  2 / 6 crescendo decrescendo systolic murmur in the aortic area  which is early peaking. Respiratory:  clear to auscultation bilaterally, normal work of breathing GI: soft, nontender, nondistended, + BS MS: no deformity or atrophy  Skin: warm and dry, no rash Neuro:  Strength and sensation are intact Psych: euthymic mood, full affect Vascular: Femoral pulses normal bilaterally.     EKG:  EKG is ordered today. EKG showed sinus bradycardia with no significant ST changes.   Recent Labs: 02/03/2021: ALT 10; BUN 17; Creatinine, Ser 0.95; Hemoglobin 13.0; Platelets 195; Potassium 4.6; Sodium 142; TSH 0.830    Lipid Panel    Component Value Date/Time   CHOL 171 02/03/2021 0824   TRIG 61 02/03/2021 0824   HDL 61 02/03/2021 0824   CHOLHDL 3.0 11/17/2019 0809   LDLCALC 98 02/03/2021 0824   LDLCALC 101 (H) 11/17/2019 0809      Wt Readings from Last 3 Encounters:  05/16/21 179 lb (81.2 kg)  03/28/21 177 lb 6.4 oz (80.5 kg)  02/02/21 178 lb (80.7 kg)       PAD Screen 05/07/2017  Previous PAD dx? No  Previous surgical procedure? No  Pain with walking? No  Feet/toe relief with dangling? No  Painful, non-healing ulcers? No  Extremities discolored? No      ASSESSMENT AND PLAN:  1.  Peripheral arterial disease: Significant right SFA stenosis with moderately reduced ABI.  The patient has no symptoms of claudication at the present time and continues to be asymptomatic.  Continue medical therapy.  I asked him to start taking aspirin 81 mg once daily.  2. Mild aortic stenosis and moderate mitral regurgitation: Followed by Dr. Domenic Polite.  3.  Essential hypertension: Blood pressure is controlled.  He has chronic bradycardia but has no symptoms.  4.  Hyperlipidemia: He is tolerating rosuvastatin.  I reviewed his most recent lipid profile which showed an LDL of 98.  We could consider adding Zetia in the future.  5.  Bilateral carotid bruits: Most recent Doppler in 2020 showed less than 40% stenosis bilaterally.  Consider repeat carotid Doppler next  year.   Disposition:   FU with me in 12 months  Signed,   Kathlyn Sacramento, MD  05/16/2021 11:44 AM    [place]

## 2021-05-16 NOTE — Patient Instructions (Signed)
Medication Instructions:  START Aspirin 81 mg daily  *If you need a refill on your cardiac medications before your next appointment, please call your pharmacy*  Follow-Up: At [facility], you and your health needs are our priority.  As part of our continuing mission to provide you with exceptional heart care, we have created designated Provider Care Teams.  These Care Teams include your primary Cardiologist (physician) and Advanced Practice Providers (APPs -  Physician Assistants and Nurse Practitioners) who all work together to provide you with the care you need, when you need it.  We recommend signing up for the patient portal called "MyChart".  Sign up information is provided on this After Visit Summary.  MyChart is used to connect with patients for Virtual Visits (Telemedicine).  Patients are able to view lab/test results, encounter notes, upcoming appointments, etc.  Non-urgent messages can be sent to your provider as well.   To learn more about what you can do with MyChart, go to [email or web address].    Your next appointment:   12 month(s)  The format for your next appointment:   In Person  Provider:   Dr. Fletcher Anon

## 2021-07-31 DIAGNOSIS — I10 Essential (primary) hypertension: Secondary | ICD-10-CM | POA: Diagnosis not present

## 2021-07-31 DIAGNOSIS — E782 Mixed hyperlipidemia: Secondary | ICD-10-CM | POA: Diagnosis not present

## 2021-08-01 LAB — CMP14+EGFR
ALT: 8 IU/L (ref 0–44)
AST: 18 IU/L (ref 0–40)
Albumin/Globulin Ratio: 1.9 (ref 1.2–2.2)
Albumin: 4.5 g/dL (ref 3.7–4.7)
Alkaline Phosphatase: 79 IU/L (ref 44–121)
BUN/Creatinine Ratio: 16 (ref 10–24)
BUN: 15 mg/dL (ref 8–27)
Bilirubin Total: 0.8 mg/dL (ref 0.0–1.2)
CO2: 26 mmol/L (ref 20–29)
Calcium: 9.5 mg/dL (ref 8.6–10.2)
Chloride: 101 mmol/L (ref 96–106)
Creatinine, Ser: 0.96 mg/dL (ref 0.76–1.27)
Globulin, Total: 2.4 g/dL (ref 1.5–4.5)
Glucose: 92 mg/dL (ref 70–99)
Potassium: 4 mmol/L (ref 3.5–5.2)
Sodium: 142 mmol/L (ref 134–144)
Total Protein: 6.9 g/dL (ref 6.0–8.5)
eGFR: 85 mL/min/{1.73_m2} (ref >59)

## 2021-08-01 LAB — LIPID PANEL WITH LDL/HDL RATIO
Cholesterol, Total: 147 mg/dL (ref 100–199)
HDL: 59 mg/dL (ref >39)
LDL Chol Calc (NIH): 76 mg/dL (ref 0–99)
LDL/HDL Ratio: 1.3 ratio (ref 0.0–3.6)
Triglycerides: 58 mg/dL (ref 0–149)
VLDL Cholesterol Cal: 12 mg/dL (ref 5–40)

## 2021-08-01 LAB — CBC WITH DIFFERENTIAL/PLATELET
Basophils Absolute: 0 10*3/uL (ref 0.0–0.2)
Basos: 1 %
EOS (ABSOLUTE): 0.2 10*3/uL (ref 0.0–0.4)
Eos: 3 %
Hematocrit: 37.6 % (ref 37.5–51.0)
Hemoglobin: 12.6 g/dL — ABNORMAL LOW (ref 13.0–17.7)
Immature Grans (Abs): 0 10*3/uL (ref 0.0–0.1)
Immature Granulocytes: 0 %
Lymphocytes Absolute: 1.8 10*3/uL (ref 0.7–3.1)
Lymphs: 30 %
MCH: 29.9 pg (ref 26.6–33.0)
MCHC: 33.5 g/dL (ref 31.5–35.7)
MCV: 89 fL (ref 79–97)
Monocytes Absolute: 0.5 10*3/uL (ref 0.1–0.9)
Monocytes: 8 %
Neutrophils Absolute: 3.5 10*3/uL (ref 1.4–7.0)
Neutrophils: 58 %
Platelets: 193 10*3/uL (ref 150–450)
RBC: 4.21 x10E6/uL (ref 4.14–5.80)
RDW: 13.1 % (ref 11.6–15.4)
WBC: 6 10*3/uL (ref 3.4–10.8)

## 2021-08-03 ENCOUNTER — Ambulatory Visit: Payer: PPO | Admitting: Nurse Practitioner

## 2021-08-03 ENCOUNTER — Ambulatory Visit: Payer: PPO | Admitting: Internal Medicine

## 2021-08-04 ENCOUNTER — Ambulatory Visit: Payer: PPO | Admitting: Internal Medicine

## 2021-08-17 DIAGNOSIS — Z125 Encounter for screening for malignant neoplasm of prostate: Secondary | ICD-10-CM | POA: Diagnosis not present

## 2021-08-17 DIAGNOSIS — I10 Essential (primary) hypertension: Secondary | ICD-10-CM | POA: Diagnosis not present

## 2021-08-17 DIAGNOSIS — E7849 Other hyperlipidemia: Secondary | ICD-10-CM | POA: Diagnosis not present

## 2021-08-17 DIAGNOSIS — Z6823 Body mass index (BMI) 23.0-23.9, adult: Secondary | ICD-10-CM | POA: Diagnosis not present

## 2021-08-17 DIAGNOSIS — G600 Hereditary motor and sensory neuropathy: Secondary | ICD-10-CM | POA: Diagnosis not present

## 2021-09-07 ENCOUNTER — Encounter: Payer: Self-pay | Admitting: *Deleted

## 2021-10-05 ENCOUNTER — Ambulatory Visit (INDEPENDENT_AMBULATORY_CARE_PROVIDER_SITE_OTHER): Payer: PPO | Admitting: Orthopedic Surgery

## 2021-10-05 ENCOUNTER — Encounter: Payer: Self-pay | Admitting: Orthopedic Surgery

## 2021-10-05 ENCOUNTER — Other Ambulatory Visit: Payer: Self-pay | Admitting: Orthopedic Surgery

## 2021-10-05 ENCOUNTER — Ambulatory Visit: Payer: PPO

## 2021-10-05 VITALS — BP 118/69 | HR 56 | Ht 72.0 in | Wt 167.0 lb

## 2021-10-05 DIAGNOSIS — M542 Cervicalgia: Secondary | ICD-10-CM

## 2021-10-05 DIAGNOSIS — M47812 Spondylosis without myelopathy or radiculopathy, cervical region: Secondary | ICD-10-CM | POA: Diagnosis not present

## 2021-10-05 NOTE — Progress Notes (Signed)
NEW PROBLEM//OFFICE VISIT ? ? ?Chief Complaint  ?Patient presents with  ? Neck Pain  ?  Neck pain for 2 mos, NKI, taking arthritis tylenol for pain w/ some relief.   ? ?71 year old male previously seen by Dr. Carloyn Manner for other issues with his back also saw Dr. In Jule Ser for his current neck problem presents with neck pain isolated for 2 months associated with decreased range of motion and popping and clicking ? ?He does not have any arm symptoms at all. ? ? ? ?ROS: Difficulty urinating neck pain back pain otherwise all negative ? ? ? ?BP 118/69   Pulse (!) 56   Ht 6' (1.829 m)   Wt 167 lb (75.8 kg)   BMI 22.65 kg/m?  ? ?Body mass index is 22.65 kg/m?. ? ?General appearance: Well-developed well-nourished no gross deformities ? ?Cardiovascular normal pulse and perfusion normal color without edema ? ?Neurologically no sensation loss or deficits or pathologic reflexes ? ?Psychological: Awake alert and oriented x3 mood and affect normal ? ?Skin no lacerations or ulcerations no nodularity no palpable masses, no erythema or nodularity ? ?Musculoskeletal:  ? ?No palpable pain but patient exhibits extreme stiffness in his neck in all planes of flexion extension rotation and lateral flexion in each direction. ? ?Normal strength in both upper extremities ? ? ?Past Medical History:  ?Diagnosis Date  ? Anemia   ? Arthritis   ? Bug bite 09/02/2019  ? Encounter for hepatitis C screening test for low risk patient 07/31/2017  ? Hammer toe of left foot 07/31/2017  ? Heart murmur   ? High risk medication use 07/31/2017  ? History of stomach ulcers   ? Hyperlipidemia   ? Hyperlipidemia LDL goal <100 07/31/2017  ? Hypertension   ? Nausea without vomiting 07/22/2017  ? Neuropathy   ? Spondylolysis of lumbar region 02/26/2017  ? Tenosynovitis of thumb 08/16/2019  ? ? ?Past Surgical History:  ?Procedure Laterality Date  ? BACK SURGERY  1997  ? L4 L5  ? CARPAL TUNNEL RELEASE Right   ? COLONOSCOPY  02/2009  ? Dr. Laural Golden: few sigmoid  diverticula, one at cecum, small submucosal lipoma at prox transverse colon  ? COLONOSCOPY WITH PROPOFOL N/A 08/22/2017  ? Dr. Gala Romney: two tubular adenomas, sigmoid diverticulosis, colonic lipoma  ? ESOPHAGOGASTRODUODENOSCOPY (EGD) WITH PROPOFOL N/A 08/22/2017  ? Dr. Gala Romney: normal esophagus, normal stomach, normal duodenum  ? POLYPECTOMY  08/22/2017  ? Procedure: POLYPECTOMY;  Surgeon: Daneil Dolin, MD;  [facility];  Service: Endoscopy;;  sigmoid  ? ? ?Family History  ?Problem Relation Age of Onset  ? Parkinson's disease Mother   ? Hypertension Mother   ? Hypertension Father   ? Cancer Sister   ?     Chemo related lung problems  ? Diabetes Son   ? Colon cancer Neg Hx   ? Liver disease Neg Hx   ? Pancreatic cancer Neg Hx   ? ?Social History  ? ?Tobacco Use  ? Smoking status: Never  ? Smokeless tobacco: Never  ?Vaping Use  ? Vaping Use: Never used  ?Substance Use Topics  ? Alcohol use: No  ? Drug use: No  ? ? ?No Known Allergies ? ?Current Meds  ?Medication Sig  ? hydrochlorothiazide (HYDRODIURIL) 25 MG tablet Take 1 tablet (25 mg total) by mouth daily.  ? pregabalin (LYRICA) 100 MG capsule Take 100 mg by mouth 2 (two) times daily.  ? rosuvastatin (CRESTOR) 20 MG tablet TAKE 1 TABLET BY MOUTH EVERY  DAY  ? tamsulosin (FLOMAX) 0.4 MG CAPS capsule Take 1 capsule (0.4 mg total) daily by mouth.  ? ? ? ?MEDICAL DECISION MAKING ? ?A.  ?Encounter Diagnoses  ?Name Primary?  ? Neck pain Yes  ? Spondylosis without myelopathy or radiculopathy, cervical region   ? ? ?B. DATA ANALYSED: ? ? ?IMAGING: ?Interpretation of images: I have personally reviewed the images and my interpretation is cervical spondylosis with loss of normal lordosis C5-6 disc space narrowing with inferior lipping of C5 and C6, uncovertebral joint arthritis and foraminal narrowing right and left ? ? ?Orders: Physical therapy ? ?Outside records reviewed: None ? ? ?C. MANAGEMENT  ? ?Recommend physical therapy ? ?NSAID therapy would be indicated as well if  tolerated otherwise Tylenol, muscle relaxer will not be out of the order as well ? ?We decided to go with physical therapy and follow-up in 6 weeks ? ?No orders of the defined types were placed in this encounter. ? ? ? ?Arther Abbott, MD ? ?10/05/2021 ?3:46 PM ?

## 2021-10-05 NOTE — Patient Instructions (Signed)
Physical therapy has been ordered for you at [facility]. They should call you to schedule, [phone] is the phone number to call, if you want to call to schedule.   

## 2021-10-25 ENCOUNTER — Ambulatory Visit (HOSPITAL_COMMUNITY): Payer: PPO | Attending: Orthopedic Surgery

## 2021-10-25 ENCOUNTER — Encounter (HOSPITAL_COMMUNITY): Payer: Self-pay

## 2021-10-25 DIAGNOSIS — M542 Cervicalgia: Secondary | ICD-10-CM | POA: Insufficient documentation

## 2021-10-25 NOTE — Therapy (Signed)
?OUTPATIENT OCCUPATIONAL THERAPY ORTHO EVALUATION ? ?Patient Name: Charles Ayala ?MRN: [id number] ?DOB:11/03/1950, 71 y.o., male ?Today's Date: 10/25/2021 ? ?PCP: Raul Del MD ?REFERRING PROVIDER: Arther Abbott MD ? ? OT End of Session - 10/25/21 1343   ? ? Visit Number 1   ? Number of Visits 8   ? Date for OT Re-Evaluation 11/22/21   ? Authorization Type [employer]   ? Progress Note Due on Visit 10   ? OT Start Time 1120   ? OT Stop Time 1200   ? OT Time Calculation (min) 40 min   ? Activity Tolerance Patient tolerated treatment well;Patient limited by pain   ? Behavior During Therapy [facility] for tasks assessed/performed   ? ?  ?  ? ?  ? ? ?Past Medical History:  ?Diagnosis Date  ? Anemia   ? Arthritis   ? Bug bite 09/02/2019  ? Encounter for hepatitis C screening test for low risk patient 07/31/2017  ? Hammer toe of left foot 07/31/2017  ? Heart murmur   ? High risk medication use 07/31/2017  ? History of stomach ulcers   ? Hyperlipidemia   ? Hyperlipidemia LDL goal <100 07/31/2017  ? Hypertension   ? Nausea without vomiting 07/22/2017  ? Neuropathy   ? Spondylolysis of lumbar region 02/26/2017  ? Tenosynovitis of thumb 08/16/2019  ? ?Past Surgical History:  ?Procedure Laterality Date  ? BACK SURGERY  1997  ? L4 L5  ? CARPAL TUNNEL RELEASE Right   ? COLONOSCOPY  02/2009  ? Dr. Laural Golden: few sigmoid diverticula, one at cecum, small submucosal lipoma at prox transverse colon  ? COLONOSCOPY WITH PROPOFOL N/A 08/22/2017  ? Dr. Gala Romney: two tubular adenomas, sigmoid diverticulosis, colonic lipoma  ? ESOPHAGOGASTRODUODENOSCOPY (EGD) WITH PROPOFOL N/A 08/22/2017  ? Dr. Gala Romney: normal esophagus, normal stomach, normal duodenum  ? POLYPECTOMY  08/22/2017  ? Procedure: POLYPECTOMY;  Surgeon: Daneil Dolin, MD;  [facility];  Service: Endoscopy;;  sigmoid  ? ?Patient Active Problem List  ? Diagnosis Date Noted  ? Bradycardia 02/02/2021  ? Bilateral hand pain 05/20/2020  ? Annual visit for general adult medical  examination with abnormal findings 11/13/2019  ? Heart murmur 11/13/2019  ? Mixed hyperlipidemia 11/13/2019  ? Tubular adenoma of colon 09/02/2017  ? Meralgia paresthetica of right side 08/26/2017  ? Normocytic anemia 07/22/2017  ? Intervertebral disc disorders with myelopathy of lumbar region 02/26/2017  ? Essential hypertension 02/22/2017  ? Benign prostatic hyperplasia without lower urinary tract symptoms 02/22/2017  ? ? ?ONSET DATE: around 2-3 months ago ? ?REFERRING DIAG: Neck pain ? ?THERAPY DIAG:  ?Cervicalgia ? ?SUBJECTIVE:  ? ?SUBJECTIVE STATEMENT: ?S: It started to hurt 2 or 3 month ago ?Pt accompanied by: self ? ?PERTINENT HISTORY: Patient is a 71 y/o male S/P neck pain with no known cause which has been ongoing for at least 2-3 months. X-ray was completed at MD visit with the following results:  ?IMAGING: ?Interpretation of images: I have personally reviewed the images and my interpretation is cervical spondylosis with loss of normal lordosis C5-6 disc Ayala narrowing with inferior lipping of C5 and C6, uncovertebral joint arthritis and foraminal narrowing right and left.  ? ?Follow up appointment with Dr. Aline Brochure: 11/16/21 ? ?PRECAUTIONS: None ? ?WEIGHT BEARING RESTRICTIONS No ? ?PAIN:  ?Are you having pain?  No pain when neck is still. 4/10 with movement. Posterior area. Intermittent, achy, Pain will increase as the day progresses due to movement.  Patient reports that he  has tried ice, heat, and pain cream without relief.  ? ?FALLS: Has patient fallen in last 6 months? No ? ?LIVING ENVIRONMENT: ?Lives with: lives with their spouse ? 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 ?PATIENT GOALS Would like to decrease neck pain.  ? ?OBJECTIVE:  ? ?HAND DOMINANCE: Right ? ?ADLs: ?Overall ADLs: Pt reports pain and discomfort with all activities that require neck movement. Mentioned driving as being difficult during evaluation. ? ? ? ? ?UE ROM    ? ?Active ROM -  seated. IR/er adducted Right ?10/25/2021 Left ?10/25/2021  ?Shoulder flexion [facility] WFL  ?Shoulder abduction [facility] WFL  ?Shoulder internal rotation [facility] WFL  ?Shoulder external rotation [facility]  ?(Blank rows = not tested) ? ?Pain felt with all movement. Patient reports that his cervical ROM was limited prior to experiencing pain. Now he has pain associated with all movement.  ?A/ROM Cervical ?Cervical rotation - Right 50 ?More painful  ?Cervical rotation - Left 55  ?Cervical flexion WNL  ?Cervical extension 60  ?Cervical lateral bend - right 30  ?Cervical lateral bend - left 30  ? ? ?UE MMT:    ? ?MMT- seated. IR/er adducted Right ?10/25/2021 Left ?10/25/2021  ?Shoulder flexion 5/5 5/5  ?Shoulder abduction 5/5 5/5  ?Shoulder adduction 5/5 5/5  ?Shoulder extension 5/5 5/5  ?Shoulder internal rotation 5/5 5/5  ?Shoulder external rotation 5/5 5/5  ?(Blank rows = not tested) ? ?Neck Flexor Endurance Test: Pain felt immediately. Chin thrust present within 10". ? ? ? ?SENSATION: ?Patient mentions some tingling and cold temperature in bilateral hands.  ? ? ? ?COGNITION: ?Overall cognitive status: Within functional limits for tasks assessed ? ?OBSERVATIONS: Cautious neck movements observed during evaluation. Associates pain with movement. Experienced pain while seated without movement causing him to jump.  ?Fascial restrictions noted in bilateral upper trapezius and cervical region.  ? ? ? ? ? ?PATIENT EDUCATION: ?Education details: Pain management: use of heat before and after HEP. HEP: cervical A/ROM. Recommend trying new pillow with medium firmness and curve for neck positioning. ?Person educated: Patient ?Education method: Explanation, Demonstration, Tactile cues, Verbal cues, and Handouts ?Education comprehension: verbalized understanding, returned demonstration, and needs further education ? ? ?HOME EXERCISE PROGRAM: ?Eval: A/ROM cervical ? ?GOALS: ? ? ?SHORT TERM GOALS: Target date: 11/22/2021   ? ?Patient will be educated and  independent with HEP in order to facilitate his progress in therapy and allow him ot complete required daily tasks that require neck movement with less difficulty and pain.  ?Baseline: ?Goal status: INITIAL ? ?2.  Patient will report a decrease in cervical neck pain of approximately 3/10 or less when attempting to sleep while utilizing pain management techniques and recommendations for activity modifications as needed.  ?Baseline:  ?Goal status: INITIAL ? ?3.  Patient will be educated and demonstrate understanding of posture education while demonstrating increased scapular strength and seated/standing posture. ?Baseline:  ?Goal status: INITIAL ? ?4.  Patient will increase his cervical endurance by completing the neck flexor endurance test without experiencing chin thrust until 20" or more.  ?Baseline:  ?Goal status: INITIAL ? ?5.  Patient will decrease his cervical and upper trapezius fascial restrictions to a min amount or less in order to increase the functional mobility and decrease discomfort felt in his neck. ?Baseline:  ?Goal status: INITIAL ? ? ? ? ?ASSESSMENT: ? ?CLINICAL IMPRESSION: ?Patient is a 71 y.o. male who was seen today for occupational therapy evaluation for posterior neck pain caused from  cervical spondylosis with decreased ROM, strength, endurance, and increased fascial restrictions resulting in pain and difficulty completing any activities requiring neck movement (ie. Driving) and experiences pain throughout the day; every day.  ? ?PERFORMANCE DEFICITS in functional skills including ADLs, IADLs, ROM, strength, pain, fascial restrictions, and flexibility.  ? ?IMPAIRMENTS are limiting patient from ADLs, IADLs, rest and sleep, and leisure.  ? ?COMORBIDITIES may have co-morbidities  that affects occupational performance. Patient will benefit from skilled OT to address above impairments and improve overall function. ? ?MODIFICATION OR ASSISTANCE TO COMPLETE EVALUATION: Min-Moderate modification of  tasks or assist with assess necessary to complete an evaluation. ? ?OT OCCUPATIONAL PROFILE AND HISTORY: Detailed assessment: Review of records and additional review of physical, cognitive, psychosocial history rel

## 2021-10-25 NOTE — Patient Instructions (Signed)
If available, place heat on your neck for 5-10 minutes prior to completing your exercises. Apply heat to your neck after completing exercises also for 5-10 minutes.  ? ? ?Try to complete 1-2 times a day or more if needed.  ? ? ?1) AROM: Lateral Neck Flexion ? ? ?Slowly tilt head toward one shoulder, then the other. Hold each position _1-2___ seconds. ?Repeat ___5-10_ times per set.  ? ?http://orth.exer.us/296  ? ?Copyright ? VHI. All rights reserved.  ?2) AROM: Neck Extension ? ? ?Bend head backward. Hold 1-2____ seconds. ?Repeat __5-10__ times per set.  ? ?http://orth.exer.us/300  ? ?Copyright ? VHI. All rights reserved.  ?3) AROM: Neck Flexion ? ? ?Bend head forward. Hold __1-2__ seconds. ?Repeat __5-10__ times per set.  ?http://orth.exer.us/298  ? ?Copyright ? VHI. All rights reserved.  ?4) AROM: Neck Rotation ? ? ?Turn head slowly to look over one shoulder, then the other. Hold each position __1-2__ seconds. ?Repeat __5-10__ times per set. ? ?Copyright ? VHI. All rights reserved.   ?

## 2021-10-31 ENCOUNTER — Encounter (HOSPITAL_COMMUNITY): Payer: Self-pay

## 2021-10-31 ENCOUNTER — Ambulatory Visit (HOSPITAL_COMMUNITY): Payer: PPO

## 2021-10-31 DIAGNOSIS — M542 Cervicalgia: Secondary | ICD-10-CM | POA: Diagnosis not present

## 2021-10-31 NOTE — Therapy (Signed)
?OUTPATIENT OCCUPATIONAL THERAPY ORTHO TREATMENT ? ?Patient Name: Charles Ayala ?MRN: [id number] ?DOB:May 28, 1951, 71 y.o., male ?Today's Date: 10/31/2021 ? ?PCP: Raul Del MD ?REFERRING PROVIDER: Arther Abbott MD ? ? OT End of Session - 10/31/21 1506   ? ? Visit Number 2   ? Number of Visits 8   ? Date for OT Re-Evaluation 11/22/21   ? Authorization Type healthstream advantage   ? Progress Note Due on Visit 10   ? OT Start Time 1507   ? OT Stop Time 1545   ? OT Time Calculation (min) 38 min   ? Activity Tolerance Patient tolerated treatment well   ? Behavior During Therapy [facility] for tasks assessed/performed   ? ?  ?  ? ?  ? ? ?Past Medical History:  ?Diagnosis Date  ? Anemia   ? Arthritis   ? Bug bite 09/02/2019  ? Encounter for hepatitis C screening test for low risk patient 07/31/2017  ? Hammer toe of left foot 07/31/2017  ? Heart murmur   ? High risk medication use 07/31/2017  ? History of stomach ulcers   ? Hyperlipidemia   ? Hyperlipidemia LDL goal <100 07/31/2017  ? Hypertension   ? Nausea without vomiting 07/22/2017  ? Neuropathy   ? Spondylolysis of lumbar region 02/26/2017  ? Tenosynovitis of thumb 08/16/2019  ? ?Past Surgical History:  ?Procedure Laterality Date  ? BACK SURGERY  1997  ? L4 L5  ? CARPAL TUNNEL RELEASE Right   ? COLONOSCOPY  02/2009  ? Dr. Laural Golden: few sigmoid diverticula, one at cecum, small submucosal lipoma at prox transverse colon  ? COLONOSCOPY WITH PROPOFOL N/A 08/22/2017  ? Dr. Gala Romney: two tubular adenomas, sigmoid diverticulosis, colonic lipoma  ? ESOPHAGOGASTRODUODENOSCOPY (EGD) WITH PROPOFOL N/A 08/22/2017  ? Dr. Gala Romney: normal esophagus, normal stomach, normal duodenum  ? POLYPECTOMY  08/22/2017  ? Procedure: POLYPECTOMY;  Surgeon: Daneil Dolin, MD;  [facility];  Service: Endoscopy;;  sigmoid  ? ?Patient Active Problem List  ? Diagnosis Date Noted  ? Bradycardia 02/02/2021  ? Bilateral hand pain 05/20/2020  ? Annual visit for general adult medical examination with abnormal  findings 11/13/2019  ? Heart murmur 11/13/2019  ? Mixed hyperlipidemia 11/13/2019  ? Tubular adenoma of colon 09/02/2017  ? Meralgia paresthetica of right side 08/26/2017  ? Normocytic anemia 07/22/2017  ? Intervertebral disc disorders with myelopathy of lumbar region 02/26/2017  ? Essential hypertension 02/22/2017  ? Benign prostatic hyperplasia without lower urinary tract symptoms 02/22/2017  ? ? ?ONSET DATE: around 2-3 months ago ? ?REFERRING DIAG: Neck pain ? ?THERAPY DIAG:  ?Cervicalgia ? ?SUBJECTIVE:  ? ?SUBJECTIVE STATEMENT: ?S:  ?Pt accompanied by: self ? ?PERTINENT HISTORY: Patient is a 71 y/o male S/P neck pain with no known cause which has been ongoing for at least 2-3 months. X-ray was completed at MD visit with the following results:  ?IMAGING: ?Interpretation of images: I have personally reviewed the images and my interpretation is cervical spondylosis with loss of normal lordosis C5-6 disc space narrowing with inferior lipping of C5 and C6, uncovertebral joint arthritis and foraminal narrowing right and left.  ? ?Follow up appointment with Dr. Aline Brochure: 11/16/21 ? ?PRECAUTIONS: None ? ? ?PAIN:  ?Are you having pain?  No pain when neck is still. 4/10 with movement. Posterior area. Intermittent, achy, Pain will increase as the day progresses due to movement.  ? ? ?OBJECTIVE: ? ? ? ? ?UE ROM    ? ?Active ROM - seated. IR/er adducted  Right ?10/25/2021 Left ?10/25/2021  ?Shoulder flexion [facility] WFL  ?Shoulder abduction [facility] WFL  ?Shoulder internal rotation [facility] WFL  ?Shoulder external rotation [facility]  ?(Blank rows = not tested) ? ?Pain felt with all movement. Patient reports that his cervical ROM was limited prior to experiencing pain. Now he has pain associated with all movement.  ?A/ROM Cervical ?Cervical rotation - Right 50 ?More painful  ?Cervical rotation - Left 55  ?Cervical flexion WNL  ?Cervical extension 60  ?Cervical lateral bend - right 30  ?Cervical lateral bend - left 30  ? ? ?UE MMT:    ? ?MMT-  seated. IR/er adducted Right ?10/25/2021 Left ?10/25/2021  ?Shoulder flexion 5/5 5/5  ?Shoulder abduction 5/5 5/5  ?Shoulder adduction 5/5 5/5  ?Shoulder extension 5/5 5/5  ?Shoulder internal rotation 5/5 5/5  ?Shoulder external rotation 5/5 5/5  ?(Blank rows = not tested) ? ?Neck Flexor Endurance Test: Pain felt immediately. Chin thrust present within 10". ? ?TODAY'S TREATMENT: ? 10/31/21 ?- Manual therapy: Completed prior to exercises. Myofascial release completed to bilateral upper trapezius and cervical region to address fascial restrictions.  ? - P/ROM: Neck, supine, right/left rotation 10X total ? - Isometrics: Neck, supine, flexion, extension, side bend Right/left; 3x5" ? - UBE: reverse only 3', level 2, pace: 10.5 ? ? ? ?PATIENT EDUCATION: ?Education details: Reviewed pillow trial recommendation and pros/cons of sleeping with a pillow does not provide adequate support and positioning. Moist heat pack information provided with review of what kinds were recommended to purchase. Bed mobility; specially rolling to the left prior to sitting up on EOB is recommended to eliminate strain on neck if patient performed a crunch and sat up immediately  ?Person educated: patient ?Education method: Veterinary surgeon, VC ?Education comprehension: verbalized and/or demonstrated understanding ? ? ?HOME EXERCISE PROGRAM: ?Eval: A/ROM cervical ? ?GOALS: ? ? ?SHORT TERM GOALS: Target date: 11/22/2021   ? ?Patient will be educated and independent with HEP in order to facilitate his progress in therapy and allow him ot complete required daily tasks that require neck movement with less difficulty and pain.  ?Baseline: ?Goal status: Progressing ? ?2.  Patient will report a decrease in cervical neck pain of approximately 3/10 or less when attempting to sleep while utilizing pain management techniques and recommendations for activity modifications as needed.  ?Baseline:  ?Goal status: Progressing ? ?3.  Patient will be educated and  demonstrate understanding of posture education while demonstrating increased scapular strength and seated/standing posture. ?Baseline:  ?Goal status: Progressing ? ?4.  Patient will increase his cervical endurance by completing the neck flexor endurance test without experiencing chin thrust until 20" or more.  ?Baseline:  ?Goal status:Progressing ? ?5.  Patient will decrease his cervical and upper trapezius fascial restrictions to a min amount or less in order to increase the functional mobility and decrease discomfort felt in his neck. ?Baseline:  ?Goal status: Progressing ? ? ? ? ?ASSESSMENT: ? ?CLINICAL IMPRESSION: A: Manual techniques completed to address fascial restrictions in bilateral upper trapezius and cervical region. Passive ROM performed followed by isometric strengthening. Pain monitored and exercises/activities modified as needed to address. VC for form and technique provided; intermittent tactile cues provided as well.  ? ?PERFORMANCE DEFICITS in functional skills including ADLs, IADLs, ROM, strength, pain, fascial restrictions, and flexibility.  ? ? ? ? ?PLAN: ?OT FREQUENCY: 2x/week ? ?OT DURATION: 4 weeks ? ?PLANNED INTERVENTIONS: self care/ADL training, therapeutic exercise, therapeutic activity, neuromuscular re-education, manual therapy, passive range of motion, electrical stimulation, ultrasound,  traction, moist heat, cryotherapy, patient/family education, coping strategies training, DME and/or AE instructions, and posture training ? ? ? ?CONSULTED AND AGREED WITH PLAN OF CARE: Patient ? ?PLAN FOR NEXT SESSION: P:  Continue with myofascial release, gentle passive stretching/ROM, continue with isometrics. Scapular strengthening starting scapular A/ROM and progressing when pain is decreased or none and form and correct. ? ? ?Ailene Ravel, OTR/L,CBIS  ?[phone] ? ?10/31/2021, 4:11 PM ?  ?

## 2021-11-03 ENCOUNTER — Telehealth: Payer: Self-pay | Admitting: *Deleted

## 2021-11-03 ENCOUNTER — Encounter (HOSPITAL_COMMUNITY): Payer: Self-pay | Admitting: Occupational Therapy

## 2021-11-03 ENCOUNTER — Ambulatory Visit (HOSPITAL_COMMUNITY): Payer: PPO | Admitting: Occupational Therapy

## 2021-11-03 DIAGNOSIS — M542 Cervicalgia: Secondary | ICD-10-CM | POA: Diagnosis not present

## 2021-11-03 NOTE — Patient Instructions (Signed)
  Upper Trapezius Stretch    Sit, one hand tucked under hip on side to be stretched, other hand over top of head. Gently pull head to side and backwards. Hold _10__ seconds. For more stretch, lean body in direction of head pull. Repeat _3__ times per session. Do _3__ sessions per day.     5) Lower Cervical / Upper Thoracic Stretch   Clasp hands together in front with arms extended. Gently pull shoulder blades apart and bend head forward. Hold 10 seconds. Repeat 3times.

## 2021-11-03 NOTE — Therapy (Signed)
OUTPATIENT OCCUPATIONAL THERAPY ORTHO TREATMENT  Patient Name: Charles Ayala MRN: [id number] DOB:02/27/1951, 71 y.o., male Today's Date: 11/03/2021  PCP: Raul Del MD REFERRING PROVIDER: Arther Abbott MD   OT End of Session - 11/03/21 1350     Visit Number 3    Number of Visits 8    Date for OT Re-Evaluation 11/22/21    Authorization Type healthstream advantage    Progress Note Due on Visit 10    OT Start Time 1123    OT Stop Time 1204    OT Time Calculation (min) 41 min    Activity Tolerance Patient tolerated treatment well    Behavior During Therapy [facility] for tasks assessed/performed              Past Medical History:  Diagnosis Date   Anemia    Arthritis    Bug bite 09/02/2019   Encounter for hepatitis C screening test for low risk patient 07/31/2017   Hammer toe of left foot 07/31/2017   Heart murmur    High risk medication use 07/31/2017   History of stomach ulcers    Hyperlipidemia    Hyperlipidemia LDL goal <100 07/31/2017   Hypertension    Nausea without vomiting 07/22/2017   Neuropathy    Spondylolysis of lumbar region 02/26/2017   Tenosynovitis of thumb 08/16/2019   Past Surgical History:  Procedure Laterality Date   BACK SURGERY  1997   L4 L5   CARPAL TUNNEL RELEASE Right    COLONOSCOPY  02/2009   Dr. Laural Golden: few sigmoid diverticula, one at cecum, small submucosal lipoma at prox transverse colon   COLONOSCOPY WITH PROPOFOL N/A 08/22/2017   Dr. Gala Romney: two tubular adenomas, sigmoid diverticulosis, colonic lipoma   ESOPHAGOGASTRODUODENOSCOPY (EGD) WITH PROPOFOL N/A 08/22/2017   Dr. Gala Romney: normal esophagus, normal stomach, normal duodenum   POLYPECTOMY  08/22/2017   Procedure: POLYPECTOMY;  Surgeon: Daneil Dolin, MD;  [facility];  Service: Endoscopy;;  sigmoid   Patient Active Problem List   Diagnosis Date Noted   Bradycardia 02/02/2021   Bilateral hand pain 05/20/2020   Annual visit for general adult medical examination with abnormal  findings 11/13/2019   Heart murmur 11/13/2019   Mixed hyperlipidemia 11/13/2019   Tubular adenoma of colon 09/02/2017   Meralgia paresthetica of right side 08/26/2017   Normocytic anemia 07/22/2017   Intervertebral disc disorders with myelopathy of lumbar region 02/26/2017   Essential hypertension 02/22/2017   Benign prostatic hyperplasia without lower urinary tract symptoms 02/22/2017    ONSET DATE: around 2-3 months ago  REFERRING DIAG: Neck pain  THERAPY DIAG:  Cervicalgia  SUBJECTIVE:   SUBJECTIVE STATEMENT: S: "I just got that pillow"  Pt accompanied by: self  PERTINENT HISTORY: Patient is a 71 y/o male S/P neck pain with no known cause which has been ongoing for at least 2-3 months. X-ray was completed at MD visit with the following results:  IMAGING: Interpretation of images: I have personally reviewed the images and my interpretation is cervical spondylosis with loss of normal lordosis C5-6 disc space narrowing with inferior lipping of C5 and C6, uncovertebral joint arthritis and foraminal narrowing right and left.   Follow up appointment with Dr. Aline Brochure: 11/16/21  PRECAUTIONS: None   PAIN:  Are you having pain?  No pain at rest. Increased pain when turning head to the right. Reports increased pain when walking dog and dog take off and jerks leash/arm. Increased pain with movement and throughout day.    OBJECTIVE:  UE ROM     Active ROM - seated. IR/er adducted Right 10/25/2021 Left 10/25/2021  Shoulder flexion [facility]  Shoulder abduction [facility]  Shoulder internal rotation [facility] WFL  Shoulder external rotation WFL WFL  (Blank rows = not tested)  Pain felt with all movement. Patient reports that his cervical ROM was limited prior to experiencing pain. Now he has pain associated with all movement.  A/ROM Cervical Cervical rotation - Right 50 More painful  Cervical rotation - Left 55  Cervical flexion WNL  Cervical extension 60  Cervical lateral bend -  right 30  Cervical lateral bend - left 30    UE MMT:     MMT- seated. IR/er adducted Right 10/25/2021 Left 10/25/2021  Shoulder flexion 5/5 5/5  Shoulder abduction 5/5 5/5  Shoulder adduction 5/5 5/5  Shoulder extension 5/5 5/5  Shoulder internal rotation 5/5 5/5  Shoulder external rotation 5/5 5/5  (Blank rows = not tested)  Neck Flexor Endurance Test: Pain felt immediately. Chin thrust present within 10".  TODAY'S TREATMENT:   11/03/21 - Completed prior to exercises. Myofascial release completed to bilateral upper arm, upper trapezius and cervical region to address fascial restrictions.  - PROM shoulder: WFL all directions  - PROM cervical neck: in supine: lateral flexion 3x5" hold for each side -scapular exercises: elevation/depression, row, extension, x to v arms, and y lift off. 10 reps each exercise  -Stretches: upper trapezius stretch, lower cervical/upper thoracic, 3x10 seconds each stretch    10/31/21 - Manual therapy: Completed prior to exercises. Myofascial release completed to bilateral upper trapezius and cervical region to address fascial restrictions.   - P/ROM: Neck, supine, right/left rotation 10X total  - Isometrics: Neck, supine, flexion, extension, side bend Right/left; 3x5"  - UBE: reverse only 3', level 2, pace: 10.5    PATIENT EDUCATION: Education details: Upper trap/lower thoracic, and upper trap stretches Person educated: patient Education method: Veterinary surgeon, VC Education comprehension: verbalized and/or demonstrated understanding   HOME EXERCISE PROGRAM: Eval: A/ROM cervical; 11/03/2021 cervical neck stretches  GOALS:   SHORT TERM GOALS: Target date: 11/22/2021    Patient will be educated and independent with HEP in order to facilitate his progress in therapy and allow him ot complete required daily tasks that require neck movement with less difficulty and pain.  Baseline: Goal status: Progressing  2.  Patient will report a decrease  in cervical neck pain of approximately 3/10 or less when attempting to sleep while utilizing pain management techniques and recommendations for activity modifications as needed.  Baseline:  Goal status: Progressing  3.  Patient will be educated and demonstrate understanding of posture education while demonstrating increased scapular strength and seated/standing posture. Baseline:  Goal status: Progressing  4.  Patient will increase his cervical endurance by completing the neck flexor endurance test without experiencing chin thrust until 20" or more.  Baseline:  Goal status:Progressing  5.  Patient will decrease his cervical and upper trapezius fascial restrictions to a min amount or less in order to increase the functional mobility and decrease discomfort felt in his neck. Baseline:  Goal status: Progressing     ASSESSMENT:  CLINICAL IMPRESSION: A: Began session with myofacial release to upper arm, trap, cervical region. PROM to shoulders, cervical region.Completed scapular exercises; VC to maintain position. Cervical/trap stretches completed, VC for correct position. Pt. Educated on HEP and home pain relief options (heating pad). Discussed pillows, pt. Has not tried an alternate pillow as his is fairly new. However, OT notes  that pain became worse at approximately the same time that he got the pillow. Provided heating pad options.   PERFORMANCE DEFICITS in functional skills including ADLs, IADLs, ROM, strength, pain, fascial restrictions, and flexibility.      PLAN: OT FREQUENCY: 2x/week  OT DURATION: 4 weeks  PLANNED INTERVENTIONS: self care/ADL training, therapeutic exercise, therapeutic activity, neuromuscular re-education, manual therapy, passive range of motion, electrical stimulation, ultrasound, traction, moist heat, cryotherapy, patient/family education, coping strategies training, DME and/or AE instructions, and posture training    CONSULTED AND AGREED WITH PLAN OF  CARE: Patient  PLAN FOR NEXT SESSION: P:  Try all scapular theraband and f/u on heating pad.    Guadelupe Sabin, OTR/L  [phone] 11/03/2021, 1:51 PM

## 2021-11-03 NOTE — Telephone Encounter (Signed)
Received referral on pt from Dr. Joselyn Arrow office for colonoscopy.  Dr. Gala Romney did last colonoscopy 08/22/2017 and pt had tubular adenoma.  Recommended repeat 5 years.  Called PCP and spoke to [place].  She was made aware and made a notation of it.  We do already have a reminder in our system for 2024.

## 2021-11-03 NOTE — Telephone Encounter (Signed)
Called pt and made him aware no need for appointment next week unless he is with GI problems. Made him aware that he is due 2024. Pt denies any issues at this time.  Informed pt to call us if he develops any.

## 2021-11-06 NOTE — Progress Notes (Signed)
This encounter was created in error - please disregard.

## 2021-11-07 ENCOUNTER — Ambulatory Visit: Payer: PPO

## 2021-11-08 ENCOUNTER — Ambulatory Visit (HOSPITAL_COMMUNITY): Payer: PPO | Admitting: Occupational Therapy

## 2021-11-08 ENCOUNTER — Encounter (HOSPITAL_COMMUNITY): Payer: Self-pay | Admitting: Occupational Therapy

## 2021-11-08 DIAGNOSIS — M542 Cervicalgia: Secondary | ICD-10-CM

## 2021-11-08 NOTE — Patient Instructions (Signed)
Repeat all exercises 10-15 times, 1-2 times per day.  1) Shoulder Flexion  Standing:         Begin with arms at your side with thumbs pointed up, slowly raise both arms up and forward towards overhead.               2) Horizontal abduction/adduction  Standing:           Begin with arms straight out in front of you, bring out to the side in at "T" shape. Keep arms straight entire time.    3) Shoulder Abduction  Standing:       Lying on your back begin with your arms flat on the table next to your side. Slowly move your arms out to the side so that they go overhead, in a jumping jack or snow angel movement.     Theraband Exercises:   1) (Home) Extension: Isometric / Bilateral Arm Retraction - Sitting   Facing anchor, hold hands and elbow at shoulder height, with elbow bent.  Pull arms back to squeeze shoulder blades together. Repeat 10-15 times. 1-3 times/day.   2) (Clinic) Extension / Flexion (Assist)   Face anchor, pull arms back, keeping elbow straight, and squeze shoulder blades together. Repeat 10-15 times. 1-3 times/day.   Copyright  VHI. All rights reserved.   3) (Home) Retraction: Row - Bilateral (Anchor)   Facing anchor, arms reaching forward, pull hands toward stomach, keeping elbows bent and at your sides and pinching shoulder blades together. Repeat 10-15 times. 1-3 times/day.   Copyright  VHI. All rights reserved.

## 2021-11-08 NOTE — Therapy (Signed)
OUTPATIENT OCCUPATIONAL THERAPY ORTHO TREATMENT  Patient Name: Charles Ayala MRN: [id number] DOB:01/09/1951, 71 y.o., male Today's Date: 11/08/2021  PCP: Raul Del MD REFERRING PROVIDER: Arther Abbott MD   OT End of Session - 11/08/21 1428     Visit Number 4    Number of Visits 8    Date for OT Re-Evaluation 11/22/21    Authorization Type [employer]    Progress Note Due on Visit 10    OT Start Time 1346    OT Stop Time 1429    OT Time Calculation (min) 43 min    Activity Tolerance Patient tolerated treatment well    Behavior During Therapy [facility] for tasks assessed/performed               Past Medical History:  Diagnosis Date   Anemia    Arthritis    Bug bite 09/02/2019   Encounter for hepatitis C screening test for low risk patient 07/31/2017   Hammer toe of left foot 07/31/2017   Heart murmur    High risk medication use 07/31/2017   History of stomach ulcers    Hyperlipidemia    Hyperlipidemia LDL goal <100 07/31/2017   Hypertension    Nausea without vomiting 07/22/2017   Neuropathy    Spondylolysis of lumbar region 02/26/2017   Tenosynovitis of thumb 08/16/2019   Past Surgical History:  Procedure Laterality Date   BACK SURGERY  1997   L4 L5   CARPAL TUNNEL RELEASE Right    COLONOSCOPY  02/2009   Dr. Laural Golden: few sigmoid diverticula, one at cecum, small submucosal lipoma at prox transverse colon   COLONOSCOPY WITH PROPOFOL N/A 08/22/2017   Dr. Gala Romney: two tubular adenomas, sigmoid diverticulosis, colonic lipoma   ESOPHAGOGASTRODUODENOSCOPY (EGD) WITH PROPOFOL N/A 08/22/2017   Dr. Gala Romney: normal esophagus, normal stomach, normal duodenum   POLYPECTOMY  08/22/2017   Procedure: POLYPECTOMY;  Surgeon: Daneil Dolin, MD;  [facility]: [facility];  Service: Endoscopy;;  sigmoid   Patient Active Problem List   Diagnosis Date Noted   Bradycardia 02/02/2021   Bilateral hand pain 05/20/2020   Annual visit for general adult medical examination with abnormal  findings 11/13/2019   Heart murmur 11/13/2019   Mixed hyperlipidemia 11/13/2019   Tubular adenoma of colon 09/02/2017   Meralgia paresthetica of right side 08/26/2017   Normocytic anemia 07/22/2017   Intervertebral disc disorders with myelopathy of lumbar region 02/26/2017   Essential hypertension 02/22/2017   Benign prostatic hyperplasia without lower urinary tract symptoms 02/22/2017    ONSET DATE: around 2-3 months ago  REFERRING DIAG: Neck pain  THERAPY DIAG:  Cervicalgia  Rationale for Evaluation and Treatment Rehabilitation   SUBJECTIVE:   SUBJECTIVE STATEMENT: S: The heating pad helps to get the soreness out.  Pt accompanied by: self  PERTINENT HISTORY: Patient is a 71 y/o male S/P neck pain with no known cause which has been ongoing for at least 2-3 months. X-ray was completed at MD visit with the following results:  IMAGING: Interpretation of images: I have personally reviewed the images and my interpretation is cervical spondylosis with loss of normal lordosis C5-6 disc space narrowing with inferior lipping of C5 and C6, uncovertebral joint arthritis and foraminal narrowing right and left.   Follow up appointment with Dr. Aline Brochure: 11/16/21  PRECAUTIONS: None   PAIN:  Are you having pain?  No pain right now   OBJECTIVE:     UE ROM     Active ROM - seated. IR/er adducted Right  10/25/2021 Left 10/25/2021  Shoulder flexion [facility] [place]  Shoulder abduction [facility]  Shoulder internal rotation [facility] WFL  Shoulder external rotation WFL WFL  (Blank rows = not tested)  Pain felt with all movement. Patient reports that his cervical ROM was limited prior to experiencing pain. Now he has pain associated with all movement.  A/ROM Cervical Cervical rotation - Right 50 More painful  Cervical rotation - Left 55  Cervical flexion WNL  Cervical extension 60  Cervical lateral bend - right 30  Cervical lateral bend - left 30    UE MMT:     MMT- seated. IR/er adducted  Right 10/25/2021 Left 10/25/2021  Shoulder flexion 5/5 5/5  Shoulder abduction 5/5 5/5  Shoulder adduction 5/5 5/5  Shoulder extension 5/5 5/5  Shoulder internal rotation 5/5 5/5  Shoulder external rotation 5/5 5/5  (Blank rows = not tested)  Neck Flexor Endurance Test: Pain felt immediately. Chin thrust present within 10".  TODAY'S TREATMENT:   11/08/21 - Completed prior to exercises. Myofascial release completed to bilateral upper arm, upper trapezius and cervical region to address fascial restrictions and increase joint ROM -P/ROM: cervical neck, lateral flexion to each side, 5X  -A/ROM: supine, cervical neck-lateral flexion, rotation, 5x5" holds -Shoulder exercises: standing-X to V arms, flexion, abduction, horizontal abduction, 10X each -Cervical neck stretches: upper trapezius stretch to each direction, lower cervical/upper thoracic stretch, 3x10" each -Scapular Theraband: row, extension, retraction, 10X each, red band -UBE: level 1, 3' forward 3' reverse, pace: 4.0    11/03/21 - Completed prior to exercises. Myofascial release completed to bilateral upper arm, upper trapezius and cervical region to address fascial restrictions.  - PROM shoulder: WFL all directions  - PROM cervical neck: in supine: lateral flexion 3x5" hold for each side -scapular exercises: elevation/depression, row, extension, x to v arms, and y lift off. 10 reps each exercise  -Stretches: upper trapezius stretch, lower cervical/upper thoracic, 3x10 seconds each stretch    10/31/21 - Manual therapy: Completed prior to exercises. Myofascial release completed to bilateral upper trapezius and cervical region to address fascial restrictions.   - P/ROM: Neck, supine, right/left rotation 10X total  - Isometrics: Neck, supine, flexion, extension, side bend Right/left; 3x5"  - UBE: reverse only 3', level 2, pace: 10.5    PATIENT EDUCATION: Education details: shoulder A/ROM, red scapular theraband  exercises Person educated: patient Education method: Veterinary surgeon, demonstration Education comprehension: verbalized understanding, returned demonstration   HOME EXERCISE PROGRAM: Eval: A/ROM cervical; 11/03/2021 cervical neck stretches; 5/24: shoulder A/ROM, red scapular theraband  GOALS:   SHORT TERM GOALS: Target date: 11/22/2021    Patient will be educated and independent with HEP in order to facilitate his progress in therapy and allow him ot complete required daily tasks that require neck movement with less difficulty and pain.  Baseline: Goal status: Progressing  2.  Patient will report a decrease in cervical neck pain of approximately 3/10 or less when attempting to sleep while utilizing pain management techniques and recommendations for activity modifications as needed.  Baseline:  Goal status: Progressing  3.  Patient will be educated and demonstrate understanding of posture education while demonstrating increased scapular strength and seated/standing posture. Baseline:  Goal status: Progressing  4.  Patient will increase his cervical endurance by completing the neck flexor endurance test without experiencing chin thrust until 20" or more.  Baseline:  Goal status:Progressing  5.  Patient will decrease his cervical and upper trapezius fascial restrictions to a min amount or less in  order to increase the functional mobility and decrease discomfort felt in his neck. Baseline:  Goal status: Progressing     ASSESSMENT:  CLINICAL IMPRESSION: A: Pt reports he is using a heating pad and it does help the soreness, has not completed neck stretches yet. Continued with myofascial release to bilateral upper trapezius and cervical neck regions, shoulder and neck A/ROM, and scapular strengthening. Pt with continued pain on the right side versus the left during all tasks. Verbal cuing for form and technique during tasks, updated HEP for shoulder A/ROM and scapular theraband.       PERFORMANCE DEFICITS in functional skills including ADLs, IADLs, ROM, strength, pain, fascial restrictions, and flexibility.      PLAN: OT FREQUENCY: 2x/week  OT DURATION: 4 weeks  PLANNED INTERVENTIONS: self care/ADL training, therapeutic exercise, therapeutic activity, neuromuscular re-education, manual therapy, passive range of motion, electrical stimulation, ultrasound, traction, moist heat, cryotherapy, patient/family education, coping strategies training, DME and/or AE instructions, and posture training    CONSULTED AND AGREED WITH PLAN OF CARE: Patient  PLAN FOR NEXT SESSION: P:  Follow up on HEP, continue with A/ROM and scapular strengthening, attempt ball on wall    Guadelupe Sabin, OTR/L  [phone] 11/08/2021, 2:38 PM

## 2021-11-10 ENCOUNTER — Ambulatory Visit (HOSPITAL_COMMUNITY): Payer: PPO | Admitting: Specialist

## 2021-11-14 ENCOUNTER — Encounter (HOSPITAL_COMMUNITY): Payer: Self-pay | Admitting: Specialist

## 2021-11-14 ENCOUNTER — Ambulatory Visit (HOSPITAL_COMMUNITY): Payer: PPO | Attending: Orthopedic Surgery | Admitting: Specialist

## 2021-11-14 DIAGNOSIS — M542 Cervicalgia: Secondary | ICD-10-CM | POA: Insufficient documentation

## 2021-11-14 NOTE — Patient Instructions (Signed)
1) Flexibility: Neck Stretch   Grasp left arm above wrist and pull down across body while gently tilting head same direction. Hold for 3 seconds. Repeat 10 times.   2) Levator Scapula Stretch   Place left hand on same side shoulder blade. With other hand, gently stretch head down and away. Hold 3 seconds. Repeat 10 times.   3) Flexibility: Neck Stretch   Grasp left arm above wrist and pull down across body while gently tilting head same direction. Hold 3 seconds. Repeat 10 times.  4) Flexibility: Neck Retraction   Pull head straight back, keeping eyes and jaw level. *Give yourself a double chin.* Hold 3 seconds. Repeat 10 times.   5) Lower Cervical / Upper Thoracic Stretch   Clasp hands together in front with arms extended. Gently pull shoulder blades apart and bend head forward. Hold 3 seconds. Repeat 10 times.

## 2021-11-14 NOTE — Therapy (Signed)
Patient Name: Charles Ayala MRN: [id number] DOB:[birth year], 71 y.o., male Today's Date: 11/14/2021     PCP: Raul Del MD REFERRING PROVIDER: Arther Abbott MD     OT End of Session - 11/08/21 1428       Visit Number 5    Number of Visits 8     Date for OT Re-Evaluation 11/22/21     Authorization Type [employer]     Progress Note Due on Visit 10     OT Start Time 1352    OT Stop Time 1440    OT Time Calculation (min) 48 min     Activity Tolerance Patient tolerated treatment well     Behavior During Therapy [facility] for tasks assessed/performed                           Past Medical History:  Diagnosis Date   Anemia     Arthritis     Bug bite 09/02/2019   Encounter for hepatitis C screening test for low risk patient 07/31/2017   Hammer toe of left foot 07/31/2017   Heart murmur     High risk medication use 07/31/2017   History of stomach ulcers     Hyperlipidemia     Hyperlipidemia LDL goal <100 07/31/2017   Hypertension     Nausea without vomiting 07/22/2017   Neuropathy     Spondylolysis of lumbar region 02/26/2017   Tenosynovitis of thumb 08/16/2019         Past Surgical History:  Procedure Laterality Date   BACK SURGERY   1997    L4 L5   CARPAL TUNNEL RELEASE Right     COLONOSCOPY   02/2009    Dr. Laural Golden: few sigmoid diverticula, one at cecum, small submucosal lipoma at prox transverse colon   COLONOSCOPY WITH PROPOFOL N/A 08/22/2017    Dr. Gala Romney: two tubular adenomas, sigmoid diverticulosis, colonic lipoma   ESOPHAGOGASTRODUODENOSCOPY (EGD) WITH PROPOFOL N/A 08/22/2017    Dr. Gala Romney: normal esophagus, normal stomach, normal duodenum   POLYPECTOMY   08/22/2017    Procedure: POLYPECTOMY;  Surgeon: Daneil Dolin, MD;  Location: [facility];  Service: Endoscopy;;  sigmoid        Patient Active Problem List    Diagnosis Date Noted   Bradycardia 02/02/2021   Bilateral hand pain 05/20/2020   Annual visit for general adult medical examination with  abnormal findings 11/13/2019   Heart murmur 11/13/2019   Mixed hyperlipidemia 11/13/2019   Tubular adenoma of colon 09/02/2017   Meralgia paresthetica of right side 08/26/2017   Normocytic anemia 07/22/2017   Intervertebral disc disorders with myelopathy of lumbar region 02/26/2017   Essential hypertension 02/22/2017   Benign prostatic hyperplasia without lower urinary tract symptoms 02/22/2017      ONSET DATE: around 2-3 months ago   REFERRING DIAG: Neck pain   THERAPY DIAG:  Cervicalgia   Rationale for Evaluation and Treatment Rehabilitation     SUBJECTIVE:    SUBJECTIVE STATEMENT: S: its getting some better, but not all the way.  Pt accompanied by: self   PERTINENT HISTORY: Patient is a 70 y/o male S/P neck pain with no known cause which has been ongoing for at least 2-3 months. X-ray was completed at MD visit with the following results:  IMAGING: Interpretation of images: I have personally reviewed the images and my interpretation is cervical spondylosis with loss of normal lordosis C5-6 disc space narrowing with inferior lipping of C5  and C6, uncovertebral joint arthritis and foraminal narrowing right and left.    Follow up appointment with Dr. Aline Brochure: 11/16/21   PRECAUTIONS: None     PAIN:  Are you having pain?  2/10 pain in left cervical region     OBJECTIVE:     TODAY'S TREATMENT:  11/14/21   Completed prior to exercises. Myofascial release completed to bilateral upper arm, upper trapezius and cervical region to address fascial restrictions and increase joint ROM --A/ROM: seated, cervical neck-lateral flexion, rotation, 5x5" holds, flexion and extension 5 x 5" hold  -Shoulder exercises: standing-X to V arms, flexion, abduction, horizontal abduction, 10X each -Cervical neck stretches: upper trapezius stretch to each direction, lower cervical/upper thoracic stretch, cervical stabilization, 3x10" each -Scapular Theraband: row, extension, retraction, 10X each, red  band -UBE: level 1, 3' forward 3' reverse, pace: 5.0 -ball on wall with shoulder flexed to 90 1', abducted to 90 attempted 1' able to complete 30" before twinge felt in left shoulder and neck region.  11/08/21 - Completed prior to exercises. Myofascial release completed to bilateral upper arm, upper trapezius and cervical region to address fascial restrictions and increase joint ROM -P/ROM: cervical neck, lateral flexion to each side, 5X  -A/ROM: supine, cervical neck-lateral flexion, rotation, 5x5" holds -Shoulder exercises: standing-X to V arms, flexion, abduction, horizontal abduction, 10X each -Cervical neck stretches: upper trapezius stretch to each direction, lower cervical/upper thoracic stretch, 3x10" each -Scapular Theraband: row, extension, retraction, 10X each, red band -UBE: level 1, 3' forward 3' reverse, pace: 4.0       11/03/21 - Completed prior to exercises. Myofascial release completed to bilateral upper arm, upper trapezius and cervical region to address fascial restrictions.  - PROM shoulder: WFL all directions  - PROM cervical neck: in supine: lateral flexion 3x5" hold for each side -scapular exercises: elevation/depression, row, extension, x to v arms, and y lift off. 10 reps each exercise  -Stretches: upper trapezius stretch, lower cervical/upper thoracic, 3x10 seconds each stretch          PATIENT EDUCATION: Education details: cervical stretches Person educated: patient Education method: Veterinary surgeon, demonstration Education comprehension: verbalized understanding, returned demonstration     HOME EXERCISE PROGRAM: Eval: A/ROM cervical; 11/03/2021 cervical neck stretches; 5/24: shoulder A/ROM, red scapular theraband, 5/30:  cervical stretches   GOALS:     SHORT TERM GOALS: Target date: 11/22/2021     Patient will be educated and independent with HEP in order to facilitate his progress in therapy and allow him ot complete required daily tasks that require  neck movement with less difficulty and pain.  Baseline: Goal status: Progressing   2.  Patient will report a decrease in cervical neck pain of approximately 3/10 or less when attempting to sleep while utilizing pain management techniques and recommendations for activity modifications as needed.  Baseline:  Goal status: Progressing   3.  Patient will be educated and demonstrate understanding of posture education while demonstrating increased scapular strength and seated/standing posture. Baseline:  Goal status: Progressing   4.  Patient will increase his cervical endurance by completing the neck flexor endurance test without experiencing chin thrust until 20" or more.  Baseline:  Goal status:Progressing   5.  Patient will decrease his cervical and upper trapezius fascial restrictions to a min amount or less in order to increase the functional mobility and decrease discomfort felt in his neck. Baseline:  Goal status: Progressing         ASSESSMENT:   CLINICAL IMPRESSION: A: Pt  reports he is using a heating pad and it does help the soreness, and has been completing his HEP at home.  Added cervical stretches and stability exercises to HEP this date.  Reequires moderate vg to keep head in midline during exercises, versus extending neck. Continued with myofascial release to bilateral upper trapezius and cervical neck regions, shoulder and neck A/ROM, and scapular strengthening. Pt now experiencing more pain on left side versus right.           PERFORMANCE DEFICITS in functional skills including ADLs, IADLs, ROM, strength, pain, fascial restrictions, and flexibility.          PLAN: OT FREQUENCY: 2x/week   OT DURATION: 4 weeks   PLANNED INTERVENTIONS: self care/ADL training, therapeutic exercise, therapeutic activity, neuromuscular re-education, manual therapy, passive range of motion, electrical stimulation, ultrasound, traction, moist heat, cryotherapy, patient/family education,  coping strategies training, DME and/or AE instructions, and posture training       CONSULTED AND AGREED WITH PLAN OF CARE: Patient   PLAN FOR NEXT SESSION: P:  Follow up on HEP, attempt abducted ball on wall for 1', increase to green theraband for scapular strengthening and increase resistance on UBE to 2.0       Vangie Bicker, [place], OTR/L [place], [place], [place] 11/14/2021, 2:48 PM

## 2021-11-16 ENCOUNTER — Ambulatory Visit: Payer: PPO | Admitting: Orthopedic Surgery

## 2021-11-16 DIAGNOSIS — M47812 Spondylosis without myelopathy or radiculopathy, cervical region: Secondary | ICD-10-CM | POA: Diagnosis not present

## 2021-11-16 DIAGNOSIS — M542 Cervicalgia: Secondary | ICD-10-CM

## 2021-11-16 NOTE — Progress Notes (Signed)
FOLLOW UP   Encounter Diagnoses  Name Primary?   Neck pain Yes   Spondylosis without myelopathy or radiculopathy, cervical region      Chief Complaint  Patient presents with   Neck Pain    Follow up/ slight improvement with physical therapy/ has 1 more PT appt. Still hurting down into the left shoulder when he looks to the right   Trouble with driving but seems better than it was   Exam still shows very stiff neck with noticeable loss of rotation   Options: therapy vs referral   Poor prognosis for return to norm ROM   Wants to continue therapy   See him in 8 weeks

## 2021-11-16 NOTE — Patient Instructions (Signed)
Continue therapy

## 2021-11-22 ENCOUNTER — Ambulatory Visit (HOSPITAL_COMMUNITY): Payer: PPO | Attending: Orthopedic Surgery | Admitting: Occupational Therapy

## 2021-11-22 ENCOUNTER — Encounter (HOSPITAL_COMMUNITY): Payer: Self-pay | Admitting: Occupational Therapy

## 2021-11-22 DIAGNOSIS — M542 Cervicalgia: Secondary | ICD-10-CM | POA: Diagnosis not present

## 2021-11-22 NOTE — Therapy (Signed)
OUTPATIENT OCCUPATIONAL THERAPY ORTHO REASSESSMENT, TREATMENT, DISCHARGE SUMMARY      Patient Name: Charles Ayala MRN: [id number] DOB:[birth year], 71 y.o., male Today's Date: 11/23/2021     PCP: Raul Del MD REFERRING PROVIDER: Arther Abbott MD     OT End of Session       Visit Number 6    Number of Visits 8     Date for OT Re-Evaluation 11/22/21     Authorization Type [employer]     Progress Note Due on Visit 10     OT Start Time 1436    OT Stop Time  1502    OT Time Calculation (min) 26 min     Activity Tolerance Patient tolerated treatment well     Behavior During Therapy [facility] for tasks assessed/performed                      Past Medical History:  Diagnosis Date   Anemia     Arthritis     Bug bite 09/02/2019   Encounter for hepatitis C screening test for low risk patient 07/31/2017   Hammer toe of left foot 07/31/2017   Heart murmur     High risk medication use 07/31/2017   History of stomach ulcers     Hyperlipidemia     Hyperlipidemia LDL goal <100 07/31/2017   Hypertension     Nausea without vomiting 07/22/2017   Neuropathy     Spondylolysis of lumbar region 02/26/2017   Tenosynovitis of thumb 08/16/2019         Past Surgical History:  Procedure Laterality Date   BACK SURGERY   1997    L4 L5   CARPAL TUNNEL RELEASE Right     COLONOSCOPY   02/2009    Dr. Laural Golden: few sigmoid diverticula, one at cecum, small submucosal lipoma at prox transverse colon   COLONOSCOPY WITH PROPOFOL N/A 08/22/2017    Dr. Gala Romney: two tubular adenomas, sigmoid diverticulosis, colonic lipoma   ESOPHAGOGASTRODUODENOSCOPY (EGD) WITH PROPOFOL N/A 08/22/2017    Dr. Gala Romney: normal esophagus, normal stomach, normal duodenum   POLYPECTOMY   08/22/2017    Procedure: POLYPECTOMY;  Surgeon: Daneil Dolin, MD;  [facility];  Service: Endoscopy;;  sigmoid        Patient Active Problem List    Diagnosis Date Noted   Bradycardia 02/02/2021   Bilateral hand pain  05/20/2020   Annual visit for general adult medical examination with abnormal findings 11/13/2019   Heart murmur 11/13/2019   Mixed hyperlipidemia 11/13/2019   Tubular adenoma of colon 09/02/2017   Meralgia paresthetica of right side 08/26/2017   Normocytic anemia 07/22/2017   Intervertebral disc disorders with myelopathy of lumbar region 02/26/2017   Essential hypertension 02/22/2017   Benign prostatic hyperplasia without lower urinary tract symptoms 02/22/2017      ONSET DATE: around 2-3 months ago   REFERRING DIAG: Neck pain   THERAPY DIAG:  Cervicalgia   Rationale for Evaluation and Treatment Rehabilitation     SUBJECTIVE:    SUBJECTIVE STATEMENT: S: I'm not as tender in my neck as I have been.  Pt accompanied by: self   PERTINENT HISTORY: Patient is a 71 y/o male S/P neck pain with no known cause which has been ongoing for at least 2-3 months. X-ray was completed at MD visit with the following results:  IMAGING: Interpretation of images: I have personally reviewed the images and my interpretation is cervical spondylosis with loss of normal lordosis C5-6  disc space narrowing with inferior lipping of C5 and C6, uncovertebral joint arthritis and foraminal narrowing right and left.      PRECAUTIONS: None     PAIN:  Are you having pain?  0/10 pain in left cervical region     OBJECTIVE:   UE ROM      Active ROM - seated. IR/er adducted Right 10/25/2021 Left 10/25/2021  Shoulder flexion [facility]  Shoulder abduction [facility]  Shoulder internal rotation [facility] WFL  Shoulder external rotation WFL WFL  (Blank rows = not tested)   Patient reports that his cervical ROM was limited prior to experiencing pain. Min pain with all measurements.  A/ROM Cervical Cervical rotation - Right 50 More painful 62  Cervical rotation - Left 55 45  Cervical flexion WNL WNL  Cervical extension 60 60  Cervical lateral bend - right 30 29  Cervical lateral bend - left 30 28      UE MMT:       MMT- seated. IR/er adducted Right 10/25/2021 Left 10/25/2021 Left  11/22/2021  Shoulder flexion 5/5 5/5 5/5  Shoulder abduction 5/5 5/5 5/5  Shoulder adduction 5/5 5/5 5/5  Shoulder extension 5/5 5/5 5/5  Shoulder internal rotation 5/5 5/5 5/5  Shoulder external rotation 5/5 5/5 5/5    Eval: Neck Flexor Endurance Test: Pain felt immediately. Chin thrust present within 10". Reassessment: Minimal pain, able to hold for 40" without chin thrust     TODAY'S TREATMENT: 11/22/2021 -Myofascial release completed to bilateral upper arm, upper trapezius and cervical region to address fascial restrictions and increase joint ROM -Patient education: educated on goals and progress, discussed pain. Pt reports less pain since beginning therapy and completing HEP    11/14/21   Completed prior to exercises. Myofascial release completed to bilateral upper arm, upper trapezius and cervical region to address fascial restrictions and increase joint ROM --A/ROM: seated, cervical neck-lateral flexion, rotation, 5x5" holds, flexion and extension 5 x 5" hold  -Shoulder exercises: standing-X to V arms, flexion, abduction, horizontal abduction, 10X each -Cervical neck stretches: upper trapezius stretch to each direction, lower cervical/upper thoracic stretch, cervical stabilization, 3x10" each -Scapular Theraband: row, extension, retraction, 10X each, red band -UBE: level 1, 3' forward 3' reverse, pace: 5.0 -ball on wall with shoulder flexed to 90 1', abducted to 90 attempted 1' able to complete 30" before twinge felt in left shoulder and neck region.  11/08/21 - Completed prior to exercises. Myofascial release completed to bilateral upper arm, upper trapezius and cervical region to address fascial restrictions and increase joint ROM -P/ROM: cervical neck, lateral flexion to each side, 5X  -A/ROM: supine, cervical neck-lateral flexion, rotation, 5x5" holds -Shoulder exercises: standing-X to V arms, flexion,  abduction, horizontal abduction, 10X each -Cervical neck stretches: upper trapezius stretch to each direction, lower cervical/upper thoracic stretch, 3x10" each -Scapular Theraband: row, extension, retraction, 10X each, red band -UBE: level 1, 3' forward 3' reverse, pace: 4.0       PATIENT EDUCATION: Education details: discussed importance of HEP completion, pillow adjustments, provided massage therapy information for [employer]. Person educated: patient Education method: Veterinary surgeon Education comprehension: verbalized understanding     HOME EXERCISE PROGRAM: Eval: A/ROM cervical; 11/03/2021 cervical neck stretches; 5/24: shoulder A/ROM, red scapular theraband, 5/30:  cervical stretches   GOALS:     SHORT TERM GOALS: Target date: 11/22/2021     Patient will be educated and independent with HEP in order to facilitate his progress in therapy and allow him ot  complete required daily tasks that require neck movement with less difficulty and pain.  Baseline: Goal status: MET   2.  Patient will report a decrease in cervical neck pain of approximately 3/10 or less when attempting to sleep while utilizing pain management techniques and recommendations for activity modifications as needed.  Baseline:  Goal status: MET   3.  Patient will be educated and demonstrate understanding of posture education while demonstrating increased scapular strength and seated/standing posture. Baseline:  Goal status: Partially MET   4.  Patient will increase his cervical endurance by completing the neck flexor endurance test without experiencing chin thrust until 20" or more.  Baseline:  Goal status: MET   5.  Patient will decrease his cervical and upper trapezius fascial restrictions to a min amount or less in order to increase the functional mobility and decrease discomfort felt in his neck. Baseline:  Goal status: NOT MET         ASSESSMENT:   CLINICAL IMPRESSION: A: Pt reports he is  completing his HEP some, MD appt went well, reports he did not want referral to neurosurgeon as he does not want to have any surgery. Reassessment completed this session. Pt has met 3/5 goals and partially met 1 additional goal. Pt's cervical neck ROM has remained relatively the same, however pain has decreased when pt is moving and completing ADLs. Continues to have pain with certain movements, however this is not as severe as it was when he began therapy. Pt continues to have moderate fascial restrictions in his bilateral upper trapezius and cervical neck regions, information was provided on local massage therapists at pt's request. At this time pt is agreeable to discharge from therapy services and complete HEP.          PERFORMANCE DEFICITS in functional skills including ADLs, IADLs, ROM, strength, pain, fascial restrictions, and flexibility.          PLAN: OT FREQUENCY: 2x/week   OT DURATION: 4 weeks   PLANNED INTERVENTIONS: self care/ADL training, therapeutic exercise, therapeutic activity, neuromuscular re-education, manual therapy, passive range of motion, electrical stimulation, ultrasound, traction, moist heat, cryotherapy, patient/family education, coping strategies training, DME and/or AE instructions, and posture training       CONSULTED AND AGREED WITH PLAN OF CARE: Patient   PLAN FOR NEXT SESSION: P:  Discharge pt       Guadelupe Sabin, OTR/L  [phone] 11/23/2021, 8:24 AM    OCCUPATIONAL THERAPY DISCHARGE SUMMARY  Visits from Start of Care: 6  Current functional level related to goals / functional outcomes: See above. Pt has met 3/5 goals and partially met 1 additional goal. Reports improvement in pain since beginning therapy.   Remaining deficits: Decreased cervical neck ROM-baseline   Education / Equipment: HEP   Patient agrees to discharge. Patient goals were partially met. Patient is being discharged due to being pleased with the current functional  level.Marland Kitchen

## 2021-12-11 ENCOUNTER — Ambulatory Visit (INDEPENDENT_AMBULATORY_CARE_PROVIDER_SITE_OTHER): Payer: PPO

## 2021-12-11 DIAGNOSIS — Z0000 Encounter for general adult medical examination without abnormal findings: Secondary | ICD-10-CM | POA: Diagnosis not present

## 2021-12-29 ENCOUNTER — Telehealth: Payer: Self-pay | Admitting: Nurse Practitioner

## 2021-12-29 NOTE — Telephone Encounter (Signed)
Patient returning missed call. 

## 2022-01-03 DIAGNOSIS — Z0000 Encounter for general adult medical examination without abnormal findings: Secondary | ICD-10-CM | POA: Diagnosis not present

## 2022-01-03 DIAGNOSIS — G600 Hereditary motor and sensory neuropathy: Secondary | ICD-10-CM | POA: Diagnosis not present

## 2022-01-03 DIAGNOSIS — I10 Essential (primary) hypertension: Secondary | ICD-10-CM | POA: Diagnosis not present

## 2022-01-03 DIAGNOSIS — E7849 Other hyperlipidemia: Secondary | ICD-10-CM | POA: Diagnosis not present

## 2022-01-03 DIAGNOSIS — Z6822 Body mass index (BMI) 22.0-22.9, adult: Secondary | ICD-10-CM | POA: Diagnosis not present

## 2022-01-11 ENCOUNTER — Encounter: Payer: Self-pay | Admitting: Orthopedic Surgery

## 2022-01-11 ENCOUNTER — Ambulatory Visit: Payer: PPO | Admitting: Orthopedic Surgery

## 2022-01-11 DIAGNOSIS — M47812 Spondylosis without myelopathy or radiculopathy, cervical region: Secondary | ICD-10-CM | POA: Diagnosis not present

## 2022-01-11 DIAGNOSIS — M542 Cervicalgia: Secondary | ICD-10-CM

## 2022-01-11 NOTE — Progress Notes (Signed)
FOLLOW UP   Encounter Diagnoses  Name Primary?   Neck pain Yes   Spondylosis without myelopathy or radiculopathy, cervical region      Chief Complaint  Patient presents with   Neck Pain   Follow-up    Recheck on neck     Charles Ayala he thinks he has made some improvement he still has some stiffness and crepitance when he turns his neck he also wanted me to check out the left acromion he says he thinks it sticks out more than the right  Examination shows no bony abnormality he may have some infraspinatus atrophy accounting for this.  I told him to watch out for any radicular-like symptoms in his upper extremities, clumsiness with his hands or gait abnormality if he sees that he is to call us for appointment

## 2022-04-11 DIAGNOSIS — G600 Hereditary motor and sensory neuropathy: Secondary | ICD-10-CM | POA: Diagnosis not present

## 2022-04-11 DIAGNOSIS — E7849 Other hyperlipidemia: Secondary | ICD-10-CM | POA: Diagnosis not present

## 2022-04-11 DIAGNOSIS — Z6824 Body mass index (BMI) 24.0-24.9, adult: Secondary | ICD-10-CM | POA: Diagnosis not present

## 2022-04-11 DIAGNOSIS — Z0000 Encounter for general adult medical examination without abnormal findings: Secondary | ICD-10-CM | POA: Diagnosis not present

## 2022-04-11 DIAGNOSIS — I10 Essential (primary) hypertension: Secondary | ICD-10-CM | POA: Diagnosis not present

## 2022-05-06 ENCOUNTER — Other Ambulatory Visit: Payer: Self-pay | Admitting: Cardiovascular Disease

## 2022-05-07 NOTE — Telephone Encounter (Signed)
Refill request

## 2022-05-15 ENCOUNTER — Encounter: Payer: Self-pay | Admitting: Cardiovascular Disease

## 2022-05-15 ENCOUNTER — Ambulatory Visit: Payer: PPO | Attending: Cardiovascular Disease | Admitting: Cardiovascular Disease

## 2022-05-15 VITALS — BP 120/72 | HR 58 | Ht 72.0 in | Wt 175.2 lb

## 2022-05-15 DIAGNOSIS — I350 Nonrheumatic aortic (valve) stenosis: Secondary | ICD-10-CM | POA: Diagnosis not present

## 2022-05-15 DIAGNOSIS — R0989 Other specified symptoms and signs involving the circulatory and respiratory systems: Secondary | ICD-10-CM | POA: Diagnosis not present

## 2022-05-15 DIAGNOSIS — I739 Peripheral vascular disease, unspecified: Secondary | ICD-10-CM

## 2022-05-15 DIAGNOSIS — I10 Essential (primary) hypertension: Secondary | ICD-10-CM | POA: Diagnosis not present

## 2022-05-15 DIAGNOSIS — E785 Hyperlipidemia, unspecified: Secondary | ICD-10-CM | POA: Diagnosis not present

## 2022-05-15 NOTE — Progress Notes (Signed)
Cardiology Office Note   Date:  05/15/2022   ID:  Charles Ayala, DOB 11/02/1950, MRN [id number]  PCP:  Neale Burly, MD  Cardiologist: Dr. Domenic Polite  Chief Complaint  Patient presents with   Follow-up       History of Present Illness: Charles Ayala is a 71 y.o. male who is here today for follow-up visit regarding peripheral arterial disease.  He has known history of mild aortic stenosis, moderate mitral regurgitation,essential hypertension, hyperlipidemia and chronic back pain.  He is not diabetic and does not smoke.  Family history is negative for coronary artery disease.   He is being followed for peripheral arterial disease with mild right calf claudication. Vascular testing in March of 2019 showed moderately reduced ABI on the right and 0.5 range with evidence of severe right SFA stenosis.  No significant disease on the left side. He continues to deny any claudication.  He walks the dog twice a day with no claudication.  No chest pain or shortness of breath.  Past Medical History:  Diagnosis Date   Anemia    Arthritis    Bug bite 09/02/2019   Encounter for hepatitis C screening test for low risk patient 07/31/2017   Hammer toe of left foot 07/31/2017   Heart murmur    High risk medication use 07/31/2017   History of stomach ulcers    Hyperlipidemia    Hyperlipidemia LDL goal <100 07/31/2017   Hypertension    Nausea without vomiting 07/22/2017   Neuropathy    Spondylolysis of lumbar region 02/26/2017   Tenosynovitis of thumb 08/16/2019    Past Surgical History:  Procedure Laterality Date   BACK SURGERY  1997   L4 L5   CARPAL TUNNEL RELEASE Right    COLONOSCOPY  02/2009   Dr. Laural Golden: few sigmoid diverticula, one at cecum, small submucosal lipoma at prox transverse colon   COLONOSCOPY WITH PROPOFOL N/A 08/22/2017   Dr. Gala Romney: two tubular adenomas, sigmoid diverticulosis, colonic lipoma   ESOPHAGOGASTRODUODENOSCOPY (EGD) WITH PROPOFOL N/A 08/22/2017   Dr. Gala Romney: normal  esophagus, normal stomach, normal duodenum   POLYPECTOMY  08/22/2017   Procedure: POLYPECTOMY;  Surgeon: Daneil Dolin, MD;  Location: [facility];  Service: Endoscopy;;  sigmoid     Current Outpatient Medications  Medication Sig Dispense Refill   hydrochlorothiazide (HYDRODIURIL) 25 MG tablet Take 1 tablet (25 mg total) by mouth daily. 90 tablet 1   pregabalin (LYRICA) 100 MG capsule Take 100 mg by mouth 2 (two) times daily.     rosuvastatin (CRESTOR) 20 MG tablet TAKE 1 TABLET BY MOUTH EVERY DAY 90 tablet 0   tamsulosin (FLOMAX) 0.4 MG CAPS capsule Take 1 capsule (0.4 mg total) daily by mouth. 90 capsule 3   No current facility-administered medications for this visit.    Allergies:   Patient has no known allergies.    Social History:  The patient  reports that he has never smoked. He has never used smokeless tobacco. He reports that he does not drink alcohol and does not use drugs.   Family History:  The patient's family history includes Cancer in his sister; Diabetes in his son; Hypertension in his father and mother; Parkinson's disease in his mother.    ROS:  Please see the history of present illness.   Otherwise, review of systems are positive for none.   All other systems are reviewed and negative.    PHYSICAL EXAM: VS:  BP 120/72   Pulse (!) 58  Ht 6' (1.829 m)   Wt 175 lb 3.2 oz (79.5 kg)   BMI 23.76 kg/m  , BMI Body mass index is 23.76 kg/m. GEN: Well nourished, well developed, in no acute distress  HEENT: normal  Neck: no JVD, or masses.  Right carotid bruit Cardiac: RRR; no  rubs, or gallops,no edema .  2 / 6 crescendo decrescendo systolic murmur in the aortic area which is early peaking. Respiratory:  clear to auscultation bilaterally, normal work of breathing GI: soft, nontender, nondistended, + BS MS: no deformity or atrophy  Skin: warm and dry, no rash Neuro:  Strength and sensation are intact Psych: euthymic mood, full affect Vascular: Femoral pulses  normal bilaterally.  Distal pulses are normal on the left side and very diminished on the right.   EKG:  EKG is ordered today. EKG showed sinus bradycardia with no significant ST changes.  Left axis deviation   Recent Labs: 07/31/2021: ALT 8; BUN 15; Creatinine, Ser 0.96; Hemoglobin 12.6; Platelets 193; Potassium 4.0; Sodium 142    Lipid Panel    Component Value Date/Time   CHOL 147 07/31/2021 0849   TRIG 58 07/31/2021 0849   HDL 59 07/31/2021 0849   CHOLHDL 3.0 11/17/2019 0809   LDLCALC 76 07/31/2021 0849   LDLCALC 101 (H) 11/17/2019 0809      Wt Readings from Last 3 Encounters:  05/15/22 175 lb 3.2 oz (79.5 kg)  10/05/21 167 lb (75.8 kg)  05/16/21 179 lb (81.2 kg)          05/07/2017   10:11 AM  PAD Screen  Previous PAD dx? No  Previous surgical procedure? No  Pain with walking? No  Feet/toe relief with dangling? No  Painful, non-healing ulcers? No  Extremities discolored? No      ASSESSMENT AND PLAN:  1.  Peripheral arterial disease: Significant right SFA stenosis with moderately reduced ABI.  He has no claudication at this time.  I asked him to start taking aspirin 81 mg daily.  2. Mild aortic stenosis and moderate mitral regurgitation: No echocardiogram since 2019.  I requested a follow-up echo.  3.  Essential hypertension: Blood pressure is controlled.  He has chronic bradycardia but has no symptoms.  4.  Hyperlipidemia: He is tolerating rosuvastatin.  Most recent lipid profile in February showed an LDL of 76 which is close to target.  5.  Right carotid bruit: No evaluation since 2020.  I requested a follow-up carotid Doppler.     Disposition:   FU with me in 12 months  Signed,   Kathlyn Sacramento, MD  05/15/2022 8:49 AM    [place]

## 2022-05-15 NOTE — Patient Instructions (Signed)
Medication Instructions:  No changes *If you need a refill on your cardiac medications before your next appointment, please call your pharmacy*   Lab Work: None ordered If you have labs (blood work) drawn today and your tests are completely normal, you will receive your results only by: [place] (if you have MyChart) OR A paper copy in the mail If you have any lab test that is abnormal or we need to change your treatment, we will call you to review the results.   Testing/Procedures: Your physician has requested that you have an echocardiogram. Echocardiography is a painless test that uses sound waves to create images of your heart. It provides your doctor with information about the size and shape of your heart and how well your heart's chambers and valves are working. You may receive an ultrasound enhancing agent through an IV if needed to better visualize your heart during the echo.This procedure takes approximately one hour. There are no restrictions for this procedure.   Your physician has requested that you have a carotid duplex. This test is an ultrasound of the carotid arteries in your neck. It looks at blood flow through these arteries that supply the brain with blood. Allow one hour for this exam. There are no restrictions or special instructions.   Follow-Up: At [facility], you and your health needs are our priority.  As part of our continuing mission to provide you with exceptional heart care, we have created designated Provider Care Teams.  These Care Teams include your primary Cardiologist (physician) and Advanced Practice Providers (APPs -  Physician Assistants and Nurse Practitioners) who all work together to provide you with the care you need, when you need it.  We recommend signing up for the patient portal called "MyChart".  Sign up information is provided on this After Visit Summary.  MyChart is used to connect with patients for Virtual Visits (Telemedicine).   Patients are able to view lab/test results, encounter notes, upcoming appointments, etc.  Non-urgent messages can be sent to your provider as well.   To learn more about what you can do with MyChart, go to [email or web address].    Your next appointment:   12 month(s)  The format for your next appointment:   In Person  Provider:   Dr. Fletcher Anon

## 2022-05-29 ENCOUNTER — Ambulatory Visit (INDEPENDENT_AMBULATORY_CARE_PROVIDER_SITE_OTHER): Payer: PPO

## 2022-05-29 ENCOUNTER — Ambulatory Visit: Payer: PPO | Attending: Cardiovascular Disease

## 2022-05-29 DIAGNOSIS — R0989 Other specified symptoms and signs involving the circulatory and respiratory systems: Secondary | ICD-10-CM

## 2022-05-29 DIAGNOSIS — I350 Nonrheumatic aortic (valve) stenosis: Secondary | ICD-10-CM

## 2022-05-29 LAB — ECHOCARDIOGRAM COMPLETE
AR max vel: 1.54 cm2
AV Area VTI: 1.42 cm2
AV Area mean vel: 1.41 cm2
AV Mean grad: 10 mmHg
AV Peak grad: 20.1 mmHg
Ao pk vel: 2.24 m/s
Area-P 1/2: 3.27 cm2
Calc EF: 60 %
MV M vel: 3.35 m/s
MV Peak grad: 44.9 mmHg
S' Lateral: 2.7 cm
Single Plane A2C EF: 61.3 %
Single Plane A4C EF: 63.4 %

## 2022-07-19 DIAGNOSIS — M810 Age-related osteoporosis without current pathological fracture: Secondary | ICD-10-CM | POA: Diagnosis not present

## 2022-07-19 DIAGNOSIS — Z7952 Long term (current) use of systemic steroids: Secondary | ICD-10-CM | POA: Diagnosis not present

## 2022-07-25 ENCOUNTER — Encounter: Payer: Self-pay | Admitting: Internal Medicine

## 2022-08-02 ENCOUNTER — Encounter: Payer: Self-pay | Admitting: *Deleted

## 2022-08-16 ENCOUNTER — Encounter: Payer: Self-pay | Admitting: Radiology

## 2022-09-02 ENCOUNTER — Encounter: Payer: Self-pay | Admitting: Cardiology

## 2022-09-02 NOTE — Progress Notes (Unsigned)
Cardiology Office Note  Date: 09/03/2022   ID: Charles Ayala, DOB 1951-05-08, MRN [id number]  History of Present Illness: Charles Ayala is a 72 y.o. male last seen in the office by Dr. Fletcher Anon in November 2023, our last encounter was in 2019.  I reviewed the interval chart.  He is here today with his wife for a follow-up visit.  Reports no angina, stable NYHA class II dyspnea, no palpitations or syncope.  Also no significant claudication, he walks his dog every day.  I reviewed his most recent imaging studies including carotid Dopplers and echocardiogram.  He is asymptomatic from the perspective of mild to moderate aortic stenosis.  We discussed surveillance over time.  He is in the process of transitioning PCPs.  I reviewed his medications.  Physical Exam: VS:  BP (!) 140/80   Pulse 64   Ht 6' (1.829 m)   Wt 162 lb 9.6 oz (73.8 kg)   SpO2 99%   BMI 22.05 kg/m , BMI Body mass index is 22.05 kg/m.  Wt Readings from Last 3 Encounters:  09/03/22 162 lb 9.6 oz (73.8 kg)  05/15/22 175 lb 3.2 oz (79.5 kg)  10/05/21 167 lb (75.8 kg)    General: Patient appears comfortable at rest. HEENT: Conjunctiva and lids normal. Neck: Supple, no elevated JVP or carotid bruits. Lungs: Clear to auscultation, nonlabored breathing at rest. Cardiac: Regular rate and rhythm, no S3, 3/6 systolic murmur. Extremities: No pitting edema, no ulcerations.  ECG:  An ECG dated 05/15/2022 was personally reviewed today and demonstrated:  Sinus bradycardia with leftward axis.  Labwork: No results found for requested labs within last 365 days.     Component Value Date/Time   CHOL 147 07/31/2021 0849   TRIG 58 07/31/2021 0849   HDL 59 07/31/2021 0849   CHOLHDL 3.0 11/17/2019 0809   LDLCALC 76 07/31/2021 0849   LDLCALC 101 (H) 11/17/2019 0809   Other Studies Reviewed Today:  Echocardiogram 05/29/2022:  1. Left ventricular ejection fraction, by estimation, is 60 to 65%. The  left ventricle has normal  function. The left ventricle has no regional  wall motion abnormalities. Left ventricular diastolic parameters are  indeterminate. The average left  ventricular global longitudinal strain is -23.6 %. The global longitudinal  strain is normal.   2. Right ventricular systolic function is normal. The right ventricular  size is normal.   3. The mitral valve is grossly normal. Mild mitral valve regurgitation.  No evidence of mitral stenosis.   4. The aortic valve is tricuspid. Aortic valve regurgitation is not  visualized. Mild to moderate aortic valve stenosis.   5. Aortic dilatation noted. There is borderline dilatation of the aortic  root, measuring 39 mm.   6. The inferior vena cava is normal in size with greater than 50%  respiratory variability, suggesting right atrial pressure of 3 mmHg.   Assessment and Plan:  1.  Degenerative calcific aortic stenosis, mild to moderate range with mean AV gradient 13 mmHg and dimensionless index 0.56 as of echocardiogram in December 2023.  He remains asymptomatic.  We will plan a follow-up echocardiogram in December of this year.  2.  Mixed hyperlipidemia.  Last LDL 76 in February 2023 on Crestor.  He is in the process of establishing with a new PCP.  3.  Essential hypertension.  Continue with current regimen.  4.  PAD, followed by Dr. Fletcher Anon.  History of significant right SFA stenosis with moderately reduced ABI.  He is on aspirin and  Crestor.  He does not report any medication at this time, no leg ulcerations.  5.  Right carotid bruit noted carotid Dopplers in December 2023 showing no obstructive carotid artery disease.  Disposition:  Follow up  after echocardiogram.  Signed, Satira Sark, M.D., F.A.C.C.

## 2022-09-03 ENCOUNTER — Ambulatory Visit: Payer: PPO | Attending: Cardiology | Admitting: Cardiology

## 2022-09-03 ENCOUNTER — Encounter: Payer: Self-pay | Admitting: Cardiology

## 2022-09-03 VITALS — BP 140/80 | HR 64 | Ht 72.0 in | Wt 162.6 lb

## 2022-09-03 DIAGNOSIS — I10 Essential (primary) hypertension: Secondary | ICD-10-CM

## 2022-09-03 DIAGNOSIS — I350 Nonrheumatic aortic (valve) stenosis: Secondary | ICD-10-CM

## 2022-09-03 DIAGNOSIS — I739 Peripheral vascular disease, unspecified: Secondary | ICD-10-CM

## 2022-09-03 DIAGNOSIS — E782 Mixed hyperlipidemia: Secondary | ICD-10-CM | POA: Diagnosis not present

## 2022-09-03 NOTE — Patient Instructions (Addendum)
Medication Instructions:  Your physician recommends that you continue on your current medications as directed. Please refer to the Current Medication list given to you today.   Labwork: none  Testing/Procedures: Your physician has requested that you have an echocardiogram in December 2024. Echocardiography is a painless test that uses sound waves to create images of your heart. It provides your doctor with information about the size and shape of your heart and how well your heart's chambers and valves are working. This procedure takes approximately one hour. There are no restrictions for this procedure. Please do NOT wear cologne, perfume, aftershave, or lotions (deodorant is allowed). Please arrive 15 minutes prior to your appointment time.  Follow-Up: Your physician recommends that you schedule a follow-up appointment in: December 2024  Any Other Special Instructions Will Be Listed Below (If Applicable).  If you need a refill on your cardiac medications before your next appointment, please call your pharmacy.

## 2022-10-24 DIAGNOSIS — H40012 Open angle with borderline findings, low risk, left eye: Secondary | ICD-10-CM | POA: Diagnosis not present

## 2022-10-24 DIAGNOSIS — H2513 Age-related nuclear cataract, bilateral: Secondary | ICD-10-CM | POA: Diagnosis not present

## 2022-11-26 DIAGNOSIS — R072 Precordial pain: Secondary | ICD-10-CM | POA: Diagnosis not present

## 2022-11-26 DIAGNOSIS — I10 Essential (primary) hypertension: Secondary | ICD-10-CM | POA: Diagnosis not present

## 2022-11-26 DIAGNOSIS — Z79899 Other long term (current) drug therapy: Secondary | ICD-10-CM | POA: Diagnosis not present

## 2022-11-26 DIAGNOSIS — R0789 Other chest pain: Secondary | ICD-10-CM | POA: Diagnosis not present

## 2022-11-26 DIAGNOSIS — R079 Chest pain, unspecified: Secondary | ICD-10-CM | POA: Diagnosis not present

## 2022-11-26 DIAGNOSIS — D649 Anemia, unspecified: Secondary | ICD-10-CM | POA: Diagnosis not present

## 2022-11-29 DIAGNOSIS — M549 Dorsalgia, unspecified: Secondary | ICD-10-CM | POA: Diagnosis not present

## 2022-11-29 DIAGNOSIS — G8929 Other chronic pain: Secondary | ICD-10-CM | POA: Diagnosis not present

## 2022-11-29 DIAGNOSIS — E7801 Familial hypercholesterolemia: Secondary | ICD-10-CM | POA: Diagnosis not present

## 2022-11-29 DIAGNOSIS — Z6821 Body mass index (BMI) 21.0-21.9, adult: Secondary | ICD-10-CM | POA: Diagnosis not present

## 2022-11-29 DIAGNOSIS — I10 Essential (primary) hypertension: Secondary | ICD-10-CM | POA: Diagnosis not present

## 2022-12-03 ENCOUNTER — Telehealth: Payer: Self-pay | Admitting: Cardiology

## 2022-12-03 NOTE — Telephone Encounter (Signed)
Left message to return call.  Will schedule hosp follow up with extender.

## 2022-12-03 NOTE — Telephone Encounter (Signed)
Pt c/o of Chest Pain: STAT if CP now or developed within 24 hours  1. Are you having CP right now?  No   2. Are you experiencing any other symptoms (ex. SOB, nausea, vomiting, sweating)?  No   3. How long have you been experiencing CP?  CP occurred a few weeks ago and patient was seen at [facility] and advised to inform his heart doctor.  4. Is your CP continuous or coming and going?  Occurred once   5. Have you taken Nitroglycerin?  No  ?

## 2022-12-03 NOTE — Telephone Encounter (Signed)
Per [facility] ED visit on 6/10/ 24 -   Fote, Marijo File, MD - 11/26/2022 8:24 AM EDT  Formatting of this note is different from the original. Call your doctor for appointment soon as possible. Read your discharge instructions follow all recommendations carefully. If you are on home medications please continue them as previously prescribed. If you were given prescriptions, fill your prescriptions from the ED and take exactly as prescribed.  Follow-up with cardiologist as referred for consideration for possible stress test.

## 2022-12-21 ENCOUNTER — Ambulatory Visit: Payer: PPO | Admitting: Nurse Practitioner

## 2022-12-21 DIAGNOSIS — E7801 Familial hypercholesterolemia: Secondary | ICD-10-CM | POA: Diagnosis not present

## 2022-12-21 DIAGNOSIS — E559 Vitamin D deficiency, unspecified: Secondary | ICD-10-CM | POA: Diagnosis not present

## 2022-12-21 DIAGNOSIS — I10 Essential (primary) hypertension: Secondary | ICD-10-CM | POA: Diagnosis not present

## 2022-12-21 DIAGNOSIS — Z0001 Encounter for general adult medical examination with abnormal findings: Secondary | ICD-10-CM | POA: Diagnosis not present

## 2022-12-21 DIAGNOSIS — Z131 Encounter for screening for diabetes mellitus: Secondary | ICD-10-CM | POA: Diagnosis not present

## 2022-12-21 DIAGNOSIS — Z1329 Encounter for screening for other suspected endocrine disorder: Secondary | ICD-10-CM | POA: Diagnosis not present

## 2022-12-21 DIAGNOSIS — Z125 Encounter for screening for malignant neoplasm of prostate: Secondary | ICD-10-CM | POA: Diagnosis not present

## 2022-12-21 DIAGNOSIS — G8929 Other chronic pain: Secondary | ICD-10-CM | POA: Diagnosis not present

## 2022-12-21 DIAGNOSIS — M549 Dorsalgia, unspecified: Secondary | ICD-10-CM | POA: Diagnosis not present

## 2022-12-21 DIAGNOSIS — E7800 Pure hypercholesterolemia, unspecified: Secondary | ICD-10-CM | POA: Diagnosis not present

## 2023-01-01 DIAGNOSIS — M549 Dorsalgia, unspecified: Secondary | ICD-10-CM | POA: Diagnosis not present

## 2023-01-01 DIAGNOSIS — Z1389 Encounter for screening for other disorder: Secondary | ICD-10-CM | POA: Diagnosis not present

## 2023-01-01 DIAGNOSIS — Z6823 Body mass index (BMI) 23.0-23.9, adult: Secondary | ICD-10-CM | POA: Diagnosis not present

## 2023-01-01 DIAGNOSIS — E7801 Familial hypercholesterolemia: Secondary | ICD-10-CM | POA: Diagnosis not present

## 2023-01-01 DIAGNOSIS — Z23 Encounter for immunization: Secondary | ICD-10-CM | POA: Diagnosis not present

## 2023-01-01 DIAGNOSIS — G8929 Other chronic pain: Secondary | ICD-10-CM | POA: Diagnosis not present

## 2023-01-01 DIAGNOSIS — I739 Peripheral vascular disease, unspecified: Secondary | ICD-10-CM | POA: Diagnosis not present

## 2023-01-01 DIAGNOSIS — I10 Essential (primary) hypertension: Secondary | ICD-10-CM | POA: Diagnosis not present

## 2023-01-01 DIAGNOSIS — Z1331 Encounter for screening for depression: Secondary | ICD-10-CM | POA: Diagnosis not present

## 2023-01-10 DIAGNOSIS — Z6823 Body mass index (BMI) 23.0-23.9, adult: Secondary | ICD-10-CM | POA: Diagnosis not present

## 2023-01-10 DIAGNOSIS — I10 Essential (primary) hypertension: Secondary | ICD-10-CM | POA: Diagnosis not present

## 2023-01-10 DIAGNOSIS — I739 Peripheral vascular disease, unspecified: Secondary | ICD-10-CM | POA: Diagnosis not present

## 2023-01-10 DIAGNOSIS — E7801 Familial hypercholesterolemia: Secondary | ICD-10-CM | POA: Diagnosis not present

## 2023-01-10 DIAGNOSIS — D519 Vitamin B12 deficiency anemia, unspecified: Secondary | ICD-10-CM | POA: Diagnosis not present

## 2023-01-10 DIAGNOSIS — Z0001 Encounter for general adult medical examination with abnormal findings: Secondary | ICD-10-CM | POA: Diagnosis not present

## 2023-01-10 DIAGNOSIS — D649 Anemia, unspecified: Secondary | ICD-10-CM | POA: Diagnosis not present

## 2023-01-10 DIAGNOSIS — Z23 Encounter for immunization: Secondary | ICD-10-CM | POA: Diagnosis not present

## 2023-01-10 DIAGNOSIS — D529 Folate deficiency anemia, unspecified: Secondary | ICD-10-CM | POA: Diagnosis not present

## 2023-01-15 ENCOUNTER — Encounter: Payer: Self-pay | Admitting: Nurse Practitioner

## 2023-01-15 ENCOUNTER — Ambulatory Visit: Payer: PPO | Attending: Nurse Practitioner | Admitting: Nurse Practitioner

## 2023-01-15 VITALS — BP 124/60 | HR 49 | Ht 72.0 in | Wt 174.0 lb

## 2023-01-15 DIAGNOSIS — I350 Nonrheumatic aortic (valve) stenosis: Secondary | ICD-10-CM | POA: Diagnosis not present

## 2023-01-15 DIAGNOSIS — E876 Hypokalemia: Secondary | ICD-10-CM

## 2023-01-15 DIAGNOSIS — I739 Peripheral vascular disease, unspecified: Secondary | ICD-10-CM | POA: Diagnosis not present

## 2023-01-15 DIAGNOSIS — E785 Hyperlipidemia, unspecified: Secondary | ICD-10-CM | POA: Diagnosis not present

## 2023-01-15 DIAGNOSIS — I10 Essential (primary) hypertension: Secondary | ICD-10-CM | POA: Diagnosis not present

## 2023-01-15 DIAGNOSIS — R001 Bradycardia, unspecified: Secondary | ICD-10-CM

## 2023-01-15 NOTE — Progress Notes (Signed)
Cardiology Office Note:  .   Date:  01/15/2023 ID:  Elrey Capozza, DOB May 08, 1951, MRN [id number] PCP: Donetta Potts, MD  [facility] Providers Cardiologist:  Nona Dell, MD    History of Present Illness: .   Charles Ayala is a 72 y.o. male with a PMH of mild to moderate aortic valve stenosis, bradycardia, HTN, PAD, mixed HLD, who presents today for hospital follow-up.   Last seen by Dr. Diona Browner on September 03, 2022. Was doing well at the time.   Presented to [facility] on November 26, 2022 for atypical chest pain. Episode was brief, Trop negative x 2. EKG was normal. Was referred to cardiology for outpatient stress test. K+ was minimally low, rest of labwork was WNL.   Today he presents for follow-up. He states he is doing well.  Denies any chest pain, shortness of breath, palpitations, syncope, presyncope, dizziness, orthopnea, PND, swelling or significant weight changes, acute bleeding, or claudication.   Studies Reviewed: Marland Kitchen    EKG:  EKG Interpretation Date/Time:  Tuesday January 15 2023 10:15:59 EDT Ventricular Rate:  49 PR Interval:  194 QRS Duration:  94 QT Interval:  414 QTC Calculation: 373 R Axis:   6  Text Interpretation: Sinus bradycardia No previous ECGs available Confirmed by Sharlene Dory [phone]) on 01/15/2023 10:42:52 AM   Echocardiogram 05/2022:  1. Left ventricular ejection fraction, by estimation, is 60 to 65%. The  left ventricle has normal function. The left ventricle has no regional  wall motion abnormalities. Left ventricular diastolic parameters are  indeterminate. The average left  ventricular global longitudinal strain is -23.6 %. The global longitudinal  strain is normal.   2. Right ventricular systolic function is normal. The right ventricular  size is normal.   3. The mitral valve is grossly normal. Mild mitral valve regurgitation.  No evidence of mitral stenosis.   4. The aortic valve is tricuspid. Aortic valve regurgitation is not   visualized. Mild to moderate aortic valve stenosis.   5. Aortic dilatation noted. There is borderline dilatation of the aortic  root, measuring 39 mm.   6. The inferior vena cava is normal in size with greater than 50%  respiratory variability, suggesting right atrial pressure of 3 mmHg.   Comparison(s): Prior images unable to be directly viewed, comparison made  by report only. Changes from prior study are noted. Stable LVEF. Mild to  moderate AS now.  Physical Exam:   VS:  BP 124/60 (BP Location: Left Arm, Patient Position: Sitting, Cuff Size: Normal)   Pulse (!) 49   Ht 6' (1.829 m)   Wt 174 lb (78.9 kg)   SpO2 99%   BMI 23.60 kg/m    Wt Readings from Last 3 Encounters:  01/15/23 174 lb (78.9 kg)  09/03/22 162 lb 9.6 oz (73.8 kg)  05/15/22 175 lb 3.2 oz (79.5 kg)    GEN: Well nourished, well developed in no acute distress NECK: No JVD; No carotid bruits CARDIAC: S1/S2, slow rate and regular rhythm, no murmurs, rubs, gallops RESPIRATORY:  Clear to auscultation without rales, wheezing or rhonchi  ABDOMEN: Soft, non-tender, non-distended EXTREMITIES:  No edema; No deformity   ASSESSMENT AND PLAN: .    Aortic valve stenosis TTE 05/2022 revealed mild to moderate aortic valve stenosis.  Remains asymptomatic.  He is pending echocardiogram 05/2023.  Will continue to monitor.  Care and ED precautions discussed.   Bradycardia Tells me he has a history of bradycardia in the past.  He is not  on any heart rate lowering medications at this time.  STOP-BANG score 3.  Politely declines arranging sleep study at this time.  He will discuss more with his wife and will let us know if he is interested later on. Continue to follow with PCP.  Care and ED precautions discussed.  Hypertension Blood pressure stable. Discussed to monitor BP at home at least 2 hours after medications and sitting for 5-10 minutes.  Continue HCTZ. Heart healthy diet and regular cardiovascular exercise encouraged.    PAD, hyperlipidemia Recent LDL 94.  Last carotid duplex 05/2022 was normal.  Continue rosuvastatin, managed by Dr. Kirke Corin. Heart healthy diet and regular cardiovascular exercise encouraged.   5. Hypokalemia Most recent labs in ED revealed minimally low potassium levels.  Patient prefers dietary sources of potassium for intake rather than medication and I discussed this with him.  He verbalized understanding. Continue to follow with PCP.  Dispo: Follow-up with me or APP in 6 months or sooner if anything changes.  Signed, Sharlene Dory, NP

## 2023-01-15 NOTE — Patient Instructions (Addendum)
Medication Instructions:  Your physician recommends that you continue on your current medications as directed. Please refer to the Current Medication list given to you today.  Labwork: None  Testing/Procedures: None  Follow-Up: Your physician recommends that you schedule a follow-up appointment in: 6 Months with Philis Nettle   Any Other Special Instructions Will Be Listed Below (If Applicable).  If you need a refill on your cardiac medications before your next appointment, please call your pharmacy.

## 2023-02-05 DIAGNOSIS — I350 Nonrheumatic aortic (valve) stenosis: Secondary | ICD-10-CM | POA: Diagnosis not present

## 2023-02-05 DIAGNOSIS — M549 Dorsalgia, unspecified: Secondary | ICD-10-CM | POA: Diagnosis not present

## 2023-02-05 DIAGNOSIS — E7801 Familial hypercholesterolemia: Secondary | ICD-10-CM | POA: Diagnosis not present

## 2023-02-05 DIAGNOSIS — I10 Essential (primary) hypertension: Secondary | ICD-10-CM | POA: Diagnosis not present

## 2023-02-05 DIAGNOSIS — I739 Peripheral vascular disease, unspecified: Secondary | ICD-10-CM | POA: Diagnosis not present

## 2023-02-05 DIAGNOSIS — Z0001 Encounter for general adult medical examination with abnormal findings: Secondary | ICD-10-CM | POA: Diagnosis not present

## 2023-02-05 DIAGNOSIS — Z6823 Body mass index (BMI) 23.0-23.9, adult: Secondary | ICD-10-CM | POA: Diagnosis not present

## 2023-02-05 DIAGNOSIS — G8929 Other chronic pain: Secondary | ICD-10-CM | POA: Diagnosis not present

## 2023-02-06 ENCOUNTER — Encounter: Payer: Self-pay | Admitting: Internal Medicine

## 2023-05-02 DIAGNOSIS — D649 Anemia, unspecified: Secondary | ICD-10-CM | POA: Diagnosis not present

## 2023-06-05 ENCOUNTER — Other Ambulatory Visit: Payer: PPO

## 2023-06-13 ENCOUNTER — Ambulatory Visit: Payer: PPO | Attending: Cardiology

## 2023-06-13 DIAGNOSIS — I350 Nonrheumatic aortic (valve) stenosis: Secondary | ICD-10-CM

## 2023-06-13 LAB — ECHOCARDIOGRAM COMPLETE
AR max vel: 2.04 cm2
AV Area VTI: 2.02 cm2
AV Area mean vel: 2.19 cm2
AV Mean grad: 12 mm[Hg]
AV Peak grad: 24.6 mm[Hg]
Ao pk vel: 2.48 m/s
Area-P 1/2: 2.38 cm2
Calc EF: 63.2 %
MV VTI: 2.21 cm2
S' Lateral: 3.7 cm
Single Plane A2C EF: 67.9 %
Single Plane A4C EF: 55.1 %

## 2023-07-18 ENCOUNTER — Encounter: Payer: Self-pay | Admitting: Nurse Practitioner

## 2023-07-18 ENCOUNTER — Ambulatory Visit: Payer: PPO | Attending: Nurse Practitioner | Admitting: Nurse Practitioner

## 2023-07-18 VITALS — BP 118/70 | HR 60 | Ht 72.0 in | Wt 173.0 lb

## 2023-07-18 DIAGNOSIS — I10 Essential (primary) hypertension: Secondary | ICD-10-CM | POA: Diagnosis not present

## 2023-07-18 DIAGNOSIS — I350 Nonrheumatic aortic (valve) stenosis: Secondary | ICD-10-CM | POA: Diagnosis not present

## 2023-07-18 DIAGNOSIS — E785 Hyperlipidemia, unspecified: Secondary | ICD-10-CM | POA: Diagnosis not present

## 2023-07-18 DIAGNOSIS — R001 Bradycardia, unspecified: Secondary | ICD-10-CM | POA: Diagnosis not present

## 2023-07-18 DIAGNOSIS — I739 Peripheral vascular disease, unspecified: Secondary | ICD-10-CM | POA: Diagnosis not present

## 2023-07-18 NOTE — Progress Notes (Signed)
Cardiology Office Note:  .   Date:  07/18/2023 ID:  Charles Ayala, DOB [birth year], MRN [id number] PCP: Donetta Potts, MD  [facility] Providers Cardiologist:  Nona Dell, MD    History of Present Illness: .   Charles Ayala is a 73 y.o. male with a PMH of mild to moderate aortic valve stenosis, bradycardia, HTN, PAD, mixed HLD, who presents today for follow-up.   Last seen by Dr. Diona Browner on September 03, 2022. Was doing well at the time.   Presented to [facility] on November 26, 2022 for atypical chest pain. Episode was brief, Trop negative x 2. EKG was normal. Was referred to cardiology for outpatient stress test. K+ was minimally low, rest of labwork was WNL.   I last saw patient on January 15, 2023.  Was doing well at that time.    Today presents for follow-up.  Continuing to do well. Denies any chest pain, shortness of breath, palpitations, syncope, presyncope, dizziness, orthopnea, PND, swelling or significant weight changes, acute bleeding, or claudication.   SH: In his free time, enjoys spending time with his Lacretia Nicks dog named Sharlet [place], 1 years old.  Studies Reviewed: Marland Kitchen    EKG: EKG is not ordered today.   Echo 05/2023:  1. Left ventricular ejection fraction, by estimation, is 65 to 70%. The  left ventricle has normal function. The left ventricle has no regional  wall motion abnormalities. There is mild concentric left ventricular  hypertrophy. Left ventricular diastolic  parameters are consistent with Grade I diastolic dysfunction (impaired  relaxation).   2. Right ventricular systolic function is normal. The right ventricular  size is normal. There is normal pulmonary artery systolic pressure. The estimated right ventricular systolic pressure is 28.2 mmHg.   3. The mitral valve is abnormal, mildly thickened and with mild posterior leaflet prolpase. Mild mitral valve regurgitation. No evidence of mitral stenosis. The mean mitral valve gradient is 2.0 mmHg.   4. The aortic  valve is tricuspid. There is mild calcification of the  aortic valve. Aortic valve regurgitation is not visualized. Mild aortic  valve stenosis. Aortic valve mean gradient measures 12.0 mmHg.  Dimentionless index 0.53.   5. Aortic dilatation noted. There is borderline dilatation of the aortic  root, measuring 39 mm.   6. The inferior vena cava is normal in size with greater than 50%  respiratory variability, suggesting right atrial pressure of 3 mmHg.   Comparison(s): Prior images reviewed side by side. Mild aortic stenosis and mild posterior leaflet mitral prolapse with mild mitral regurgitation.  Echocardiogram 05/2022:  1. Left ventricular ejection fraction, by estimation, is 60 to 65%. The  left ventricle has normal function. The left ventricle has no regional  wall motion abnormalities. Left ventricular diastolic parameters are  indeterminate. The average left  ventricular global longitudinal strain is -23.6 %. The global longitudinal  strain is normal.   2. Right ventricular systolic function is normal. The right ventricular  size is normal.   3. The mitral valve is grossly normal. Mild mitral valve regurgitation.  No evidence of mitral stenosis.   4. The aortic valve is tricuspid. Aortic valve regurgitation is not  visualized. Mild to moderate aortic valve stenosis.   5. Aortic dilatation noted. There is borderline dilatation of the aortic  root, measuring 39 mm.   6. The inferior vena cava is normal in size with greater than 50%  respiratory variability, suggesting right atrial pressure of 3 mmHg.   Comparison(s): Prior images unable to  be directly viewed, comparison made  by report only. Changes from prior study are noted. Stable LVEF. Mild to  moderate AS now.  Physical Exam:   VS:  BP 118/70   Pulse 60   Ht 6' (1.829 m)   Wt 173 lb (78.5 kg)   SpO2 96%   BMI 23.46 kg/m    Wt Readings from Last 3 Encounters:  07/18/23 173 lb (78.5 kg)  01/15/23 174 lb (78.9 kg)   09/03/22 162 lb 9.6 oz (73.8 kg)    GEN: Well nourished, well developed in no acute distress NECK: No JVD; No carotid bruits CARDIAC: S1/S2, RRR, no murmurs, rubs, gallops RESPIRATORY:  Clear to auscultation without rales, wheezing or rhonchi  ABDOMEN: Soft, non-tender, non-distended EXTREMITIES:  No edema; No deformity   ASSESSMENT AND PLAN: .    Aortic valve stenosis TTE 05/2023 revealed stable mild aortic valve stenosis.  Originally ordered echocardiogram to be updated, however this was recently updated last month, we will cancel this test.  Remains asymptomatic. Will continue to monitor.  Care and ED precautions discussed.   Bradycardia  History of bradycardia in the past.  He is not on any heart rate lowering medications at this time.  STOP-BANG score 3.  Politely has declined arranging sleep study.  Continue to follow with PCP.  Care and ED precautions discussed.  Hypertension Blood pressure stable. Discussed to monitor BP at home at least 2 hours after medications and sitting for 5-10 minutes.  Continue HCTZ. Heart healthy diet and regular cardiovascular exercise encouraged.   PAD, hyperlipidemia LDL 94 12/2022.  Last carotid duplex 05/2022 was normal.  Continue rosuvastatin, managed by Dr. Kirke Corin.  Did discuss increasing rosuvastatin/adding Zetia to further lower LDL to goal, however patient politely declined at this time.  Will consider revisiting this at next office visit.  Heart healthy diet and regular cardiovascular exercise encouraged.   Dispo: Follow-up with me or APP in 6 months or sooner if anything changes.  Signed, Sharlene Dory, NP

## 2023-07-18 NOTE — Patient Instructions (Addendum)
Medication Instructions:  Your physician recommends that you continue on your current medications as directed. Please refer to the Current Medication list given to you today.  Labwork: None   Testing: - None   Follow-Up: Your physician recommends that you schedule a follow-up appointment in: 6 months   Any Other Special Instructions Will Be Listed Below (If Applicable).  If you need a refill on your cardiac medications before your next appointment, please call your pharmacy.

## 2023-07-19 ENCOUNTER — Encounter: Payer: Self-pay | Admitting: Nurse Practitioner

## 2023-07-22 ENCOUNTER — Telehealth: Payer: Self-pay | Admitting: Nurse Practitioner

## 2023-07-22 NOTE — Telephone Encounter (Signed)
-----   Message from Sharlene Dory sent at 07/19/2023 10:30 AM EST ----- Please cancel Echo. He already had an Echo performed last month. My apologies!   Thanks!   Best,  Sharlene Dory, NP

## 2023-07-22 NOTE — Telephone Encounter (Signed)
Left patient message and canceled Echo order

## 2023-07-29 DIAGNOSIS — I350 Nonrheumatic aortic (valve) stenosis: Secondary | ICD-10-CM | POA: Diagnosis not present

## 2023-07-29 DIAGNOSIS — I10 Essential (primary) hypertension: Secondary | ICD-10-CM | POA: Diagnosis not present

## 2023-07-29 DIAGNOSIS — Z6822 Body mass index (BMI) 22.0-22.9, adult: Secondary | ICD-10-CM | POA: Diagnosis not present

## 2023-07-29 DIAGNOSIS — I739 Peripheral vascular disease, unspecified: Secondary | ICD-10-CM | POA: Diagnosis not present

## 2023-07-29 DIAGNOSIS — E7800 Pure hypercholesterolemia, unspecified: Secondary | ICD-10-CM | POA: Diagnosis not present

## 2023-07-30 ENCOUNTER — Other Ambulatory Visit: Payer: PPO

## 2023-11-26 ENCOUNTER — Encounter: Payer: Self-pay | Admitting: Cardiovascular Disease

## 2023-11-26 ENCOUNTER — Ambulatory Visit: Attending: Cardiovascular Disease | Admitting: Cardiovascular Disease

## 2023-11-26 VITALS — BP 124/66 | HR 48 | Ht 72.0 in | Wt 170.2 lb

## 2023-11-26 DIAGNOSIS — R001 Bradycardia, unspecified: Secondary | ICD-10-CM

## 2023-11-26 DIAGNOSIS — I10 Essential (primary) hypertension: Secondary | ICD-10-CM

## 2023-11-26 DIAGNOSIS — E785 Hyperlipidemia, unspecified: Secondary | ICD-10-CM | POA: Diagnosis not present

## 2023-11-26 DIAGNOSIS — I350 Nonrheumatic aortic (valve) stenosis: Secondary | ICD-10-CM

## 2023-11-26 DIAGNOSIS — I739 Peripheral vascular disease, unspecified: Secondary | ICD-10-CM | POA: Diagnosis not present

## 2023-11-26 MED ORDER — ROSUVASTATIN CALCIUM 40 MG PO TABS: 40.0000 mg | ORAL_TABLET | Freq: Every day | ORAL | 3 refills | Status: AC

## 2023-11-26 MED ORDER — ASPIRIN 81 MG PO TBEC: 81.0000 mg | DELAYED_RELEASE_TABLET | Freq: Every day | ORAL | Status: AC

## 2023-11-26 NOTE — Patient Instructions (Addendum)
 Medication Instructions:  INCREASE the Rosuvastatin  to 40 mg once daily START Aspirin  81 mg once daily  *If you need a refill on your cardiac medications before your next appointment, please call your pharmacy*  Lab Work: None ordered If you have labs (blood work) drawn today and your tests are completely normal, you will receive your results only by: MyChart Message (if you have MyChart) OR A paper copy in the mail If you have any lab test that is abnormal or we need to change your treatment, we will call you to review the results.  Testing/Procedures: None ordered  Follow-Up: At [facility], you and your health needs are our priority.  As part of our continuing mission to provide you with exceptional heart care, our providers are all part of one team.  This team includes your primary Cardiologist (physician) and Advanced Practice Providers or APPs (Physician Assistants and Nurse Practitioners) who all work together to provide you with the care you need, when you need it.  Your next appointment:   12 month(s)  Provider:   Dr. Alvenia Aus  We recommend signing up for the patient portal called "MyChart".  Sign up information is provided on this After Visit Summary.  MyChart is used to connect with patients for Virtual Visits (Telemedicine).  Patients are able to view lab/test results, encounter notes, upcoming appointments, etc.  Non-urgent messages can be sent to your provider as well.   To learn more about what you can do with MyChart, go to [email or web address].

## 2023-11-26 NOTE — Progress Notes (Signed)
 Cardiology Office Note   Date:  11/26/2023   ID:  Cassian Torelli, DOB 1950/08/19, MRN [id number]  PCP:  Lauran Pollard, MD  Cardiologist: Dr. Londa Rival  No chief complaint on file.      History of Present Illness: Charles Ayala is a 73 y.o. male who is here today for follow-up visit regarding peripheral arterial disease.  He has known history of  aortic stenosis, moderate mitral regurgitation,essential hypertension, hyperlipidemia and chronic back pain.  He is not diabetic and does not smoke.  Family history is negative for coronary artery disease.   He is being followed for peripheral arterial disease with mild right calf claudication. Vascular testing in March of 2019 showed moderately reduced ABI  with evidence of severe right SFA stenosis.  No significant disease on the left side. Carotid Doppler in December 2023 showed no significant disease bilaterally. He continues to deny any claudication.  He walks the dog twice a day with no claudication.  No chest pain or shortness of breath.  Past Medical History:  Diagnosis Date   Anemia    Aortic stenosis    Arthritis    Bug bite 09/02/2019   Encounter for hepatitis C screening test for low risk patient 07/31/2017   Hammer toe of left foot 07/31/2017   High risk medication use 07/31/2017   History of stomach ulcers    Hyperlipidemia    Hyperlipidemia LDL goal <100 07/31/2017   Hypertension    Nausea without vomiting 07/22/2017   Neuropathy    PAD (peripheral artery disease) (HCC)    Spondylolysis of lumbar region 02/26/2017   Tenosynovitis of thumb 08/16/2019    Past Surgical History:  Procedure Laterality Date   BACK SURGERY  1997   L4 L5   CARPAL TUNNEL RELEASE Right    COLONOSCOPY  02/2009   Dr. Homero Luster: few sigmoid diverticula, one at cecum, small submucosal lipoma at prox transverse colon   COLONOSCOPY WITH PROPOFOL  N/A 08/22/2017   Dr. Riley Cheadle: two tubular adenomas, sigmoid diverticulosis, colonic lipoma    ESOPHAGOGASTRODUODENOSCOPY (EGD) WITH PROPOFOL  N/A 08/22/2017   Dr. Riley Cheadle: normal esophagus, normal stomach, normal duodenum   POLYPECTOMY  08/22/2017   Procedure: POLYPECTOMY;  Surgeon: Suzette Espy, MD;  Location: [facility];  Service: Endoscopy;;  sigmoid     Current Outpatient Medications  Medication Sig Dispense Refill   hydrochlorothiazide  (HYDRODIURIL ) 25 MG tablet Take 1 tablet (25 mg total) by mouth daily. 90 tablet 1   pregabalin (LYRICA) 100 MG capsule Take 100 mg by mouth 2 (two) times daily.     rosuvastatin  (CRESTOR ) 20 MG tablet TAKE 1 TABLET BY MOUTH EVERY DAY 90 tablet 0   tamsulosin  (FLOMAX ) 0.4 MG CAPS capsule Take 1 capsule (0.4 mg total) daily by mouth. 90 capsule 3   No current facility-administered medications for this visit.    Allergies:   Patient has no known allergies.    Social History:  The patient  reports that he has never smoked. He has never used smokeless tobacco. He reports that he does not drink alcohol  and does not use drugs.   Family History:  The patient's family history includes Cancer in his sister; Diabetes in his son; Hypertension in his father and mother; Parkinson's disease in his mother.    ROS:  Please see the history of present illness.   Otherwise, review of systems are positive for none.   All other systems are reviewed and negative.    PHYSICAL EXAM: VS:  BP 124/66 (BP Location: Left Arm, Patient Position: Sitting)   Pulse (!) 48   Ht 6' (1.829 m)   Wt 170 lb 3.2 oz (77.2 kg)   SpO2 99%   BMI 23.08 kg/m  , BMI Body mass index is 23.08 kg/m. GEN: Well nourished, well developed, in no acute distress  HEENT: normal  Neck: no JVD, or masses.  Right carotid bruit Cardiac: RRR; no  rubs, or gallops,no edema .  2 / 6 crescendo decrescendo systolic murmur in the aortic area which is early peaking. Respiratory:  clear to auscultation bilaterally, normal work of breathing GI: soft, nontender, nondistended, + BS MS: no deformity or  atrophy  Skin: warm and dry, no rash Neuro:  Strength and sensation are intact Psych: euthymic mood, full affect    EKG:  EKG is ordered today. EKG showed: Sinus bradycardia Septal infarct , age undetermined When compared with ECG of 15-Jan-2023 10:15, Septal infarct is now Present    Recent Labs: No results found for requested labs within last 365 days.    Lipid Panel    Component Value Date/Time   CHOL 147 07/31/2021 0849   TRIG 58 07/31/2021 0849   HDL 59 07/31/2021 0849   CHOLHDL 3.0 11/17/2019 0809   LDLCALC 76 07/31/2021 0849   LDLCALC 101 (H) 11/17/2019 0809      Wt Readings from Last 3 Encounters:  11/26/23 170 lb 3.2 oz (77.2 kg)  07/18/23 173 lb (78.5 kg)  01/15/23 174 lb (78.9 kg)          05/07/2017   10:11 AM  PAD Screen  Previous PAD dx? No  Previous surgical procedure? No  Pain with walking? No  Feet/toe relief with dangling? No  Painful, non-healing ulcers? No  Extremities discolored? No      ASSESSMENT AND PLAN:  1.  Peripheral arterial disease: Significant right SFA stenosis with moderately reduced ABI.  He has no claudication at this time.  I again instructed him to take aspirin  81 mg once daily.  2.  Aortic stenosis and mitral regurgitation: These have been stable on most recent echocardiogram in December 2024.  3.  Essential hypertension: Blood pressure is controlled.  He has chronic bradycardia but has no symptoms.  4.  Hyperlipidemia: He is on rosuvastatin  20 mg daily.  Most recent LDL was 76.  I elected to increase rosuvastatin  to 40 mg daily.  5.  Right carotid bruit: Carotid Doppler showed no significant disease.   Disposition:   FU with me in 12 months  Signed,   Antionette Kirks, MD  11/26/2023 9:33 AM    [facility]

## 2024-01-27 ENCOUNTER — Other Ambulatory Visit: Payer: Self-pay | Admitting: Cardiovascular Disease

## 2024-02-03 DIAGNOSIS — E559 Vitamin D deficiency, unspecified: Secondary | ICD-10-CM | POA: Diagnosis not present

## 2024-02-03 DIAGNOSIS — Z1329 Encounter for screening for other suspected endocrine disorder: Secondary | ICD-10-CM | POA: Diagnosis not present

## 2024-02-03 DIAGNOSIS — E7801 Familial hypercholesterolemia: Secondary | ICD-10-CM | POA: Diagnosis not present

## 2024-02-03 DIAGNOSIS — D559 Anemia due to enzyme disorder, unspecified: Secondary | ICD-10-CM | POA: Diagnosis not present

## 2024-02-03 DIAGNOSIS — Z131 Encounter for screening for diabetes mellitus: Secondary | ICD-10-CM | POA: Diagnosis not present

## 2024-02-03 DIAGNOSIS — I10 Essential (primary) hypertension: Secondary | ICD-10-CM | POA: Diagnosis not present

## 2024-02-03 DIAGNOSIS — Z13220 Encounter for screening for lipoid disorders: Secondary | ICD-10-CM | POA: Diagnosis not present

## 2024-02-07 DIAGNOSIS — Z23 Encounter for immunization: Secondary | ICD-10-CM | POA: Diagnosis not present

## 2024-02-07 DIAGNOSIS — I739 Peripheral vascular disease, unspecified: Secondary | ICD-10-CM | POA: Diagnosis not present

## 2024-02-07 DIAGNOSIS — Z6822 Body mass index (BMI) 22.0-22.9, adult: Secondary | ICD-10-CM | POA: Diagnosis not present

## 2024-02-07 DIAGNOSIS — Z1331 Encounter for screening for depression: Secondary | ICD-10-CM | POA: Diagnosis not present

## 2024-02-07 DIAGNOSIS — Z1389 Encounter for screening for other disorder: Secondary | ICD-10-CM | POA: Diagnosis not present

## 2024-02-07 DIAGNOSIS — R413 Other amnesia: Secondary | ICD-10-CM | POA: Diagnosis not present

## 2024-02-07 DIAGNOSIS — E7800 Pure hypercholesterolemia, unspecified: Secondary | ICD-10-CM | POA: Diagnosis not present

## 2024-02-07 DIAGNOSIS — I10 Essential (primary) hypertension: Secondary | ICD-10-CM | POA: Diagnosis not present

## 2024-02-07 DIAGNOSIS — I350 Nonrheumatic aortic (valve) stenosis: Secondary | ICD-10-CM | POA: Diagnosis not present

## 2024-02-07 DIAGNOSIS — D559 Anemia due to enzyme disorder, unspecified: Secondary | ICD-10-CM | POA: Diagnosis not present

## 2024-02-07 DIAGNOSIS — E7801 Familial hypercholesterolemia: Secondary | ICD-10-CM | POA: Diagnosis not present

## 2024-02-07 DIAGNOSIS — Z0001 Encounter for general adult medical examination with abnormal findings: Secondary | ICD-10-CM | POA: Diagnosis not present

## 2024-02-20 ENCOUNTER — Encounter: Payer: Self-pay | Admitting: Gastroenterology

## 2024-03-02 DIAGNOSIS — M542 Cervicalgia: Secondary | ICD-10-CM | POA: Diagnosis not present

## 2024-03-02 DIAGNOSIS — M6281 Muscle weakness (generalized): Secondary | ICD-10-CM | POA: Diagnosis not present

## 2024-03-10 DIAGNOSIS — I10 Essential (primary) hypertension: Secondary | ICD-10-CM | POA: Diagnosis not present

## 2024-03-10 DIAGNOSIS — I739 Peripheral vascular disease, unspecified: Secondary | ICD-10-CM | POA: Diagnosis not present

## 2024-03-10 DIAGNOSIS — D649 Anemia, unspecified: Secondary | ICD-10-CM | POA: Diagnosis not present

## 2024-03-10 DIAGNOSIS — R413 Other amnesia: Secondary | ICD-10-CM | POA: Diagnosis not present

## 2024-03-10 DIAGNOSIS — Z6822 Body mass index (BMI) 22.0-22.9, adult: Secondary | ICD-10-CM | POA: Diagnosis not present

## 2024-03-10 DIAGNOSIS — D519 Vitamin B12 deficiency anemia, unspecified: Secondary | ICD-10-CM | POA: Diagnosis not present

## 2024-03-10 DIAGNOSIS — E7800 Pure hypercholesterolemia, unspecified: Secondary | ICD-10-CM | POA: Diagnosis not present

## 2024-03-10 DIAGNOSIS — I350 Nonrheumatic aortic (valve) stenosis: Secondary | ICD-10-CM | POA: Diagnosis not present

## 2024-03-16 ENCOUNTER — Ambulatory Visit: Admitting: Cardiology

## 2024-03-17 DIAGNOSIS — M542 Cervicalgia: Secondary | ICD-10-CM | POA: Diagnosis not present

## 2024-03-25 NOTE — Therapy (Unsigned)
 OUTPATIENT PHYSICAL THERAPY EVALUATION   Patient Name: Charles Ayala MRN: [id number] DOB:1950/12/20, 73 y.o., male Today's Date: 03/26/2024  END OF SESSION:  PT End of Session - 03/26/24 0917     Visit Number 1    Number of Visits 12    Date for Recertification  05/21/24    PT Start Time 0840    PT Stop Time 0920    PT Time Calculation (min) 40 min    Activity Tolerance Patient tolerated treatment well    Behavior During Therapy [facility] for tasks assessed/performed          Past Medical History:  Diagnosis Date   Anemia    Aortic stenosis    Arthritis    Bug bite 09/02/2019   Encounter for hepatitis C screening test for low risk patient 07/31/2017   Hammer toe of left foot 07/31/2017   High risk medication use 07/31/2017   History of stomach ulcers    Hyperlipidemia    Hyperlipidemia LDL goal <100 07/31/2017   Hypertension    Nausea without vomiting 07/22/2017   Neuropathy    PAD (peripheral artery disease)    Spondylolysis of lumbar region 02/26/2017   Tenosynovitis of thumb 08/16/2019   Past Surgical History:  Procedure Laterality Date   BACK SURGERY  1997   L4 L5   CARPAL TUNNEL RELEASE Right    COLONOSCOPY  02/2009   Dr. Golda: few sigmoid diverticula, one at cecum, small submucosal lipoma at prox transverse colon   COLONOSCOPY WITH PROPOFOL  N/A 08/22/2017   Dr. Shaaron: two tubular adenomas, sigmoid diverticulosis, colonic lipoma   ESOPHAGOGASTRODUODENOSCOPY (EGD) WITH PROPOFOL  N/A 08/22/2017   Dr. Shaaron: normal esophagus, normal stomach, normal duodenum   POLYPECTOMY  08/22/2017   Procedure: POLYPECTOMY;  Surgeon: Shaaron [name], MD;  [facility];  Service: Endoscopy;;  sigmoid   Patient Active Problem List   Diagnosis Date Noted   Bradycardia 02/02/2021   Bilateral hand pain 05/20/2020   Annual visit for general adult medical examination with abnormal findings 11/13/2019   Heart murmur 11/13/2019   Mixed hyperlipidemia 11/13/2019   Tubular  adenoma of colon 09/02/2017   Meralgia paresthetica of right side 08/26/2017   Normocytic anemia 07/22/2017   Intervertebral disc disorders with myelopathy of lumbar region 02/26/2017   Essential hypertension 02/22/2017   Benign prostatic hyperplasia without lower urinary tract symptoms 02/22/2017    ERE:Tpoopjfd, Vaughn FALCON, MD   REFERRING PROVIDER: Joshua Alm Hamilton, MD   REFERRING DIAG: M54.2 (ICD-10-CM) - Cervicalgia   Rationale for Evaluation and Treatment:  Rehabiliation  THERAPY DIAG:  Cervicalgia  Muscle weakness (generalized)  ONSET DATE: months of pain   SUBJECTIVE:  SUBJECTIVE STATEMENT: Relays chronic pain in his neck, it gets sore, has a hard time turning his neck, Denies N/T or changes in gait. When asked if he has any prior neck surgery he reports I may have, I am not sure, I used to see Dr. Gaither for things  PERTINENT HISTORY:  See above PMH, previous back surgery  PAIN:  NPRS scale: 9/10 upon arrival (difficult for him to provide numbers) Pain location:neck and shoulders on both sides Pain description: constant, achy, sharp, crunches Aggravating factors: turning, reach, lift, carry Relieving factors: medication sometimes work   PRECAUTIONS: ,  None  RED FLAGS: None   WEIGHT BEARING RESTRICTIONS:  No  FALLS:  Has patient fallen in last 6 months? No   OCCUPATION:  retired  PLOF:  Independent  PATIENT GOALS:  Reduce pain, be able to improve function more  OBJECTIVE:  Note: Objective measures were completed at Evaluation unless otherwise noted.  DIAGNOSTIC FINDINGS:  Neck XR 2023 impression cervical spondylosis, DDD, reversal of lordosis   PATIENT SURVEYS:  Patient-Specific Activity Scoring Scheme  0 represents "unable to perform." 10 represents  "able to perform at prior level. 0 1 2 3 4 5 6 7 8 9  10 (Date and Score)  *It is difficult for him to provide numbers, has to be prompted several times to provide number instead of continuing to describe symptoms Activity Eval     1. Lifting 5 # bag 5     2. Turning head  5    3.     4.    5.    Score 5/10    Total score = sum of the activity scores/number of activities Minimum detectable change (90%CI) for average score = 2 points Minimum detectable change (90%CI) for single activity score = 3 points    POSTURE:  rounded shoulders and forward head    CERVICAL ROM:   Active ROM A/PROM (deg) eval  Flexion 40  Extension 30  Right lateral flexion 20  Left lateral flexion 10  Right rotation 50  Left rotation 50   (Blank rows = not tested)   UPPER EXTREMITY ROM:WFL grossly bilat UPPER EXTREMITY MMT:  MMT Right eval Left eval  Shoulder flexion 4+ 4+  Shoulder extension    Shoulder abduction 4 4  Shoulder adduction    Shoulder extension    Shoulder internal rotation 5 5  Shoulder external rotation 4 4  Middle trapezius    Lower trapezius    Elbow flexion 4 4  Elbow extension 4 4  Wrist flexion    Wrist extension    Wrist ulnar deviation    Wrist radial deviation    Wrist pronation    Wrist supination    Grip strength 5 5   (Blank rows = not tested)  TREATMENT DATE:  Eval Therex: HEP creation and review with demonstration, education and trial set preformed, see below for details. Moist heat applied to neck X 10 min during HEP creation.     PATIENT EDUCATION: Education details: HEP, PT plan of care Person educated: Patient Education method: Explanation, Demonstration, Verbal cues, and Handouts Education comprehension: verbalized understanding, further education recommended   HOME EXERCISE PROGRAM: Access Code: [email or web address] URL:  [email or web address] Date: 03/26/2024 Prepared by: Redell Moose  Exercises - Seated Assisted Cervical Rotation with Towel  - 2 x daily - 6 x weekly - 1 sets - 10 reps - 5 hold - Standing Isometric Cervical Sidebending with Manual Resistance  - 2 x daily - 6 x weekly - 1 sets - 10 reps - 5 sec hold - Standing Isometric Cervical Flexion with Manual Resistance  - 2 x daily - 6 x weekly - 1 sets - 10 reps - 5 sec hold - Standing Isometric Cervical Extension with Manual Resistance  - 2 x daily - 6 x weekly - 1 sets - 10 reps - 5 hold - Standing Shoulder Row with Anchored Resistance  - 1 x daily - 6 x weekly - 2 sets - 10 reps - Shoulder extension with resistance - Neutral  - 1 x daily - 6 x weekly - 2 sets - 10 reps - Standing Shoulder External Rotation with Resistance  - 1 x daily - 6 x weekly - 2 sets - 10 reps - Standing Shoulder Horizontal Abduction with Resistance  - 1 x daily - 6 x weekly - 2 sets - 10 reps  ASSESSMENT:  CLINICAL IMPRESSION: Patient referred to PT for Chronic neck pain, cervical spondylosis, DDD, reversal of lordosis. He has neck stiffness and tightness limiting his ROM as well as some overall UE weakness bilat. Patient will benefit from skilled PT to improve overall function and to address impairments and limitations listed below.  OBJECTIVE IMPAIRMENTS: decreased activity tolerance for ADL's, decreased endurance, decreased mobility, decreased ROM, decreased strength, impaired flexibility, impaired UE and pain.  ACTIVITY LIMITATIONS: bending, liftting, walking, standing, cleaning, community activity, driving, reaching, carry, occupation  PERSONAL FACTORS: see above PMH  also affecting patient's functional outcome.  REHAB POTENTIAL: Good  CLINICAL DECISION MAKING: Evolving/moderate complexity  EVALUATION COMPLEXITY: Moderate    GOALS: Short term PT Goals Target date: 04/23/2024   Pt will be I and compliant with HEP. Baseline:  Goal status: New Pt  will decrease pain by 25% overall Baseline: Goal status: New  Long term PT goals Target date:05/21/2024   Pt will improve neck ROM to [facility] at least 60 deg rotation and 20 deg lateral flexion to improve functional mobility Baseline: Goal status: New Pt will improve  strength to at least 4+/5 MMT to improve functional strength Baseline: Goal status: New Pt will improve Patient specific functional scale (PSFS) to at least 7/10 to show improved function level Baseline: Goal status: New Pt will reduce pain to overall less than 3/10 with usual activity and work activity. Baseline: Goal status: New  PLAN: PT FREQUENCY: 1-3 times per week   PT DURATION: 6-8 weeks  PLANNED INTERVENTIONS  97110-Therapeutic exercises, 97530- Therapeutic activity, [id number]- Neuromuscular re-education, 97535- Self Care, 02859- Manual therapy, [id number]- Electrical stimulation (unattended), 02987- Traction (mechanical), and Patient/Family education  PLAN FOR NEXT SESSION: He says he was told he has 2# weight restriction. Work on neck ROM and strength for neck/shoulders as tolerated with bands or 2# max. General endurance work  Next MD appt: ?  Redell JONELLE Moose, PT,DPT 03/26/2024, 9:18 AM

## 2024-03-26 ENCOUNTER — Encounter: Payer: Self-pay | Admitting: Physical Therapy

## 2024-03-26 ENCOUNTER — Ambulatory Visit: Attending: Neurological Surgery | Admitting: Physical Therapy

## 2024-03-26 DIAGNOSIS — M542 Cervicalgia: Secondary | ICD-10-CM | POA: Diagnosis not present

## 2024-03-26 DIAGNOSIS — M6281 Muscle weakness (generalized): Secondary | ICD-10-CM | POA: Insufficient documentation

## 2024-03-30 ENCOUNTER — Encounter: Payer: Self-pay | Admitting: Physical Therapy

## 2024-03-30 ENCOUNTER — Ambulatory Visit: Admitting: Physical Therapy

## 2024-03-30 DIAGNOSIS — M542 Cervicalgia: Secondary | ICD-10-CM | POA: Diagnosis not present

## 2024-03-30 DIAGNOSIS — M6281 Muscle weakness (generalized): Secondary | ICD-10-CM

## 2024-03-30 NOTE — Therapy (Signed)
 OUTPATIENT PHYSICAL THERAPY EVALUATION   Patient Name: Charles Ayala MRN: [id number] DOB:02/06/1951, 73 y.o., male Today's Date: 03/30/2024  END OF SESSION:  PT End of Session - 03/30/24 0843     Visit Number 2    Number of Visits 12    Date for Recertification  05/21/24    PT Start Time 0840    PT Stop Time 0920    PT Time Calculation (min) 40 min    Activity Tolerance Patient tolerated treatment well    Behavior During Therapy [facility] for tasks assessed/performed          Past Medical History:  Diagnosis Date   Anemia    Aortic stenosis    Arthritis    Bug bite 09/02/2019   Encounter for hepatitis C screening test for low risk patient 07/31/2017   Hammer toe of left foot 07/31/2017   High risk medication use 07/31/2017   History of stomach ulcers    Hyperlipidemia    Hyperlipidemia LDL goal <100 07/31/2017   Hypertension    Nausea without vomiting 07/22/2017   Neuropathy    PAD (peripheral artery disease)    Spondylolysis of lumbar region 02/26/2017   Tenosynovitis of thumb 08/16/2019   Past Surgical History:  Procedure Laterality Date   BACK SURGERY  1997   L4 L5   CARPAL TUNNEL RELEASE Right    COLONOSCOPY  02/2009   Dr. Golda: few sigmoid diverticula, one at cecum, small submucosal lipoma at prox transverse colon   COLONOSCOPY WITH PROPOFOL  N/A 08/22/2017   Dr. Shaaron: two tubular adenomas, sigmoid diverticulosis, colonic lipoma   ESOPHAGOGASTRODUODENOSCOPY (EGD) WITH PROPOFOL  N/A 08/22/2017   Dr. Shaaron: normal esophagus, normal stomach, normal duodenum   POLYPECTOMY  08/22/2017   Procedure: POLYPECTOMY;  Surgeon: Shaaron [name], MD;  Location[facility];  Service: Endoscopy;;  sigmoid   Patient Active Problem List   Diagnosis Date Noted   Bradycardia 02/02/2021   Bilateral hand pain 05/20/2020   Annual visit for general adult medical examination with abnormal findings 11/13/2019   Heart murmur 11/13/2019   Mixed hyperlipidemia 11/13/2019   Tubular  adenoma of colon 09/02/2017   Meralgia paresthetica of right side 08/26/2017   Normocytic anemia 07/22/2017   Intervertebral disc disorders with myelopathy of lumbar region 02/26/2017   Essential hypertension 02/22/2017   Benign prostatic hyperplasia without lower urinary tract symptoms 02/22/2017    ERE:Tpoopjfd, Vaughn FALCON, MD   REFERRING PROVIDER: Trudy Vaughn FALCON, MD   REFERRING DIAG: M54.2 (ICD-10-CM) - Cervicalgia   Rationale for Evaluation and Treatment:  Rehabiliation  THERAPY DIAG:  Cervicalgia  Muscle weakness (generalized)  ONSET DATE: months of pain   SUBJECTIVE:  SUBJECTIVE STATEMENT: Relays his neck is a little better than it was but still has a catch in it at times  PERTINENT HISTORY:  See above PMH, previous back surgery  PAIN:  NPRS scale: says it is better, does not provide # (difficult for him to provide numbers) Pain location:neck and shoulders on both sides Pain description: constant, achy, sharp, crunches Aggravating factors: turning, reach, lift, carry Relieving factors: medication sometimes work   PRECAUTIONS: ,  None  RED FLAGS: None   WEIGHT BEARING RESTRICTIONS:  No  FALLS:  Has patient fallen in last 6 months? No   OCCUPATION:  retired  PLOF:  Independent  PATIENT GOALS:  Reduce pain, be able to improve function more  OBJECTIVE:  Note: Objective measures were completed at Evaluation unless otherwise noted.  DIAGNOSTIC FINDINGS:  Neck XR 2023 impression cervical spondylosis, DDD, reversal of lordosis   PATIENT SURVEYS:  Patient-Specific Activity Scoring Scheme  0 represents "unable to perform." 10 represents "able to perform at prior level. 0 1 2 3 4 5 6 7 8 9  10 (Date and Score)  *It is difficult for him to provide numbers,  has to be prompted several times to provide number instead of continuing to describe symptoms Activity Eval     1. Lifting 5 # bag 5     2. Turning head  5    3.     4.    5.    Score 5/10    Total score = sum of the activity scores/number of activities Minimum detectable change (90%CI) for average score = 2 points Minimum detectable change (90%CI) for single activity score = 3 points    POSTURE:  rounded shoulders and forward head    CERVICAL ROM:   Active ROM A/PROM (deg) eval  Flexion 40  Extension 30  Right lateral flexion 20  Left lateral flexion 10  Right rotation 50  Left rotation 50   (Blank rows = not tested)   UPPER EXTREMITY ROM:WFL grossly bilat UPPER EXTREMITY MMT:  MMT Right eval Left eval  Shoulder flexion 4+ 4+  Shoulder extension    Shoulder abduction 4 4  Shoulder adduction    Shoulder extension    Shoulder internal rotation 5 5  Shoulder external rotation 4 4  Middle trapezius    Lower trapezius    Elbow flexion 4 4  Elbow extension 4 4  Wrist flexion    Wrist extension    Wrist ulnar deviation    Wrist radial deviation    Wrist pronation    Wrist supination    Grip strength 5 5   (Blank rows = not tested)                                                                                                                           TREATMENT DATE:  03/30/24 Therex: UBE L1 X 3 min forward, 3 min reverse HEP review with demonstration, education. Moist heat applied to  neck X 10 min during HEP review and education  Cervical rotation stretch with strap AAROM 5 sec X 10 Cervical isometrics next lateral flexion, extension, flexion, all 10 reps each holding 5 seconds  Theractivity Standing shoulder rows Green 2X10 Standing shoulder extensions green 2X10 Standing shoulder ER greeen 2X10 Standing horizontal abduction green 2X10  Manual therapy Deep tissue mobilization, trigger point release to upper traps bilat     PATIENT  EDUCATION: Education details: HEP, PT plan of care Person educated: Patient Education method: Explanation, Demonstration, Verbal cues, and Handouts Education comprehension: verbalized understanding, further education recommended   HOME EXERCISE PROGRAM: Access Code: [email or web address] URL: [email or web address] Date: 03/26/2024 Prepared by: Redell Moose  Exercises - Seated Assisted Cervical Rotation with Towel  - 2 x daily - 6 x weekly - 1 sets - 10 reps - 5 hold - Standing Isometric Cervical Sidebending with Manual Resistance  - 2 x daily - 6 x weekly - 1 sets - 10 reps - 5 sec hold - Standing Isometric Cervical Flexion with Manual Resistance  - 2 x daily - 6 x weekly - 1 sets - 10 reps - 5 sec hold - Standing Isometric Cervical Extension with Manual Resistance  - 2 x daily - 6 x weekly - 1 sets - 10 reps - 5 hold - Standing Shoulder Row with Anchored Resistance  - 1 x daily - 6 x weekly - 2 sets - 10 reps - Shoulder extension with resistance - Neutral  - 1 x daily - 6 x weekly - 2 sets - 10 reps - Standing Shoulder External Rotation with Resistance  - 1 x daily - 6 x weekly - 2 sets - 10 reps - Standing Shoulder Horizontal Abduction with Resistance  - 1 x daily - 6 x weekly - 2 sets - 10 reps  ASSESSMENT:  CLINICAL IMPRESSION: He needs several cues and reminders for proper technique with HEP which was reviewed with him today. He tolerated them well and appears to be feeling some better all ready with PT.  Per Eval: Patient referred to PT for Chronic neck pain, cervical spondylosis, DDD, reversal of lordosis. He has neck stiffness and tightness limiting his ROM as well as some overall UE weakness bilat. Patient will benefit from skilled PT to improve overall function and to address impairments and limitations listed below.  OBJECTIVE IMPAIRMENTS: decreased activity tolerance for ADL's, decreased endurance, decreased mobility, decreased ROM, decreased strength, impaired flexibility,  impaired UE and pain.  ACTIVITY LIMITATIONS: bending, liftting, walking, standing, cleaning, community activity, driving, reaching, carry, occupation  PERSONAL FACTORS: see above PMH  also affecting patient's functional outcome.  REHAB POTENTIAL: Good  CLINICAL DECISION MAKING: Evolving/moderate complexity  EVALUATION COMPLEXITY: Moderate    GOALS: Short term PT Goals Target date: 04/23/2024   Pt will be I and compliant with HEP. Baseline:  Goal status: New Pt will decrease pain by 25% overall Baseline: Goal status: New  Long term PT goals Target date:05/21/2024   Pt will improve neck ROM to [facility] at least 60 deg rotation and 20 deg lateral flexion to improve functional mobility Baseline: Goal status: New Pt will improve  strength to at least 4+/5 MMT to improve functional strength Baseline: Goal status: New Pt will improve Patient specific functional scale (PSFS) to at least 7/10 to show improved function level Baseline: Goal status: New Pt will reduce pain to overall less than 3/10 with usual activity and work activity. Baseline: Goal status: New  PLAN: PT FREQUENCY: 1-3 times per  week   PT DURATION: 6-8 weeks  PLANNED INTERVENTIONS  97110-Therapeutic exercises, 97530- Therapeutic activity, [id number]- Neuromuscular re-education, 97535- Self Care, 02859- Manual therapy, [id number]- Electrical stimulation (unattended), 97012- Traction (mechanical), and Patient/Family education  PLAN FOR NEXT SESSION: He says he was told he has 2# weight restriction. Work on neck ROM and strength for neck/shoulders as tolerated with bands or 2# max. General endurance work  Next MD appt: ?  Redell JONELLE Moose, PT,DPT 03/30/2024, 9:17 AM

## 2024-04-02 ENCOUNTER — Ambulatory Visit: Admitting: Physical Therapy

## 2024-04-02 ENCOUNTER — Encounter: Payer: Self-pay | Admitting: Physical Therapy

## 2024-04-02 DIAGNOSIS — M542 Cervicalgia: Secondary | ICD-10-CM | POA: Diagnosis not present

## 2024-04-02 DIAGNOSIS — M6281 Muscle weakness (generalized): Secondary | ICD-10-CM

## 2024-04-02 NOTE — Therapy (Signed)
 OUTPATIENT PHYSICAL THERAPY TREATMENT    Patient Name: Charles Ayala MRN: [id number] DOB:13-May-1951, 73 y.o., male Today's Date: 04/02/2024  END OF SESSION:  PT End of Session - 04/02/24 0844     Visit Number 3    Number of Visits 12    Date for Recertification  05/21/24    PT Start Time 0838    PT Stop Time 0918    PT Time Calculation (min) 40 min    Activity Tolerance Patient tolerated treatment well    Behavior During Therapy [facility] for tasks assessed/performed           Past Medical History:  Diagnosis Date   Anemia    Aortic stenosis    Arthritis    Bug bite 09/02/2019   Encounter for hepatitis C screening test for low risk patient 07/31/2017   Hammer toe of left foot 07/31/2017   High risk medication use 07/31/2017   History of stomach ulcers    Hyperlipidemia    Hyperlipidemia LDL goal <100 07/31/2017   Hypertension    Nausea without vomiting 07/22/2017   Neuropathy    PAD (peripheral artery disease)    Spondylolysis of lumbar region 02/26/2017   Tenosynovitis of thumb 08/16/2019   Past Surgical History:  Procedure Laterality Date   BACK SURGERY  1997   L4 L5   CARPAL TUNNEL RELEASE Right    COLONOSCOPY  02/2009   Dr. Golda: few sigmoid diverticula, one at cecum, small submucosal lipoma at prox transverse colon   COLONOSCOPY WITH PROPOFOL  N/A 08/22/2017   Dr. Shaaron: two tubular adenomas, sigmoid diverticulosis, colonic lipoma   ESOPHAGOGASTRODUODENOSCOPY (EGD) WITH PROPOFOL  N/A 08/22/2017   Dr. Shaaron: normal esophagus, normal stomach, normal duodenum   POLYPECTOMY  08/22/2017   Procedure: POLYPECTOMY;  Surgeon: Shaaron [name], MD;  [facility];  Service: Endoscopy;;  sigmoid   Patient Active Problem List   Diagnosis Date Noted   Bradycardia 02/02/2021   Bilateral hand pain 05/20/2020   Annual visit for general adult medical examination with abnormal findings 11/13/2019   Heart murmur 11/13/2019   Mixed hyperlipidemia 11/13/2019   Tubular  adenoma of colon 09/02/2017   Meralgia paresthetica of right side 08/26/2017   Normocytic anemia 07/22/2017   Intervertebral disc disorders with myelopathy of lumbar region 02/26/2017   Essential hypertension 02/22/2017   Benign prostatic hyperplasia without lower urinary tract symptoms 02/22/2017    ERE:Tpoopjfd, Vaughn FALCON, MD   REFERRING PROVIDER: Trudy Vaughn FALCON, MD   REFERRING DIAG: M54.2 (ICD-10-CM) - Cervicalgia   Rationale for Evaluation and Treatment:  Rehabiliation  THERAPY DIAG:  Cervicalgia  Muscle weakness (generalized)  ONSET DATE: months of pain   SUBJECTIVE:  SUBJECTIVE STATEMENT:  Feeling OK, shoulder catch a little in the mornings but its annoying not more than that   PERTINENT HISTORY:  See above PMH, previous back surgery  PAIN:  NPRS scale: 5/10 Pain location:neck and shoulders on both sides Pain description: its different, less than it was before  Aggravating factors: turning, reach, lift, carry, mornings are more painful  Relieving factors: medication sometimes work   PRECAUTIONS: ,  None  RED FLAGS: None   WEIGHT BEARING RESTRICTIONS:  No  FALLS:  Has patient fallen in last 6 months? No   OCCUPATION:  retired  PLOF:  Independent  PATIENT GOALS:  Reduce pain, be able to improve function more  OBJECTIVE:  Note: Objective measures were completed at Evaluation unless otherwise noted.  DIAGNOSTIC FINDINGS:  Neck XR 2023 impression cervical spondylosis, DDD, reversal of lordosis   PATIENT SURVEYS:  Patient-Specific Activity Scoring Scheme  0 represents "unable to perform." 10 represents "able to perform at prior level. 0 1 2 3 4 5 6 7 8 9  10 (Date and Score)  *It is difficult for him to provide numbers, has to be prompted several  times to provide number instead of continuing to describe symptoms Activity Eval     1. Lifting 5 # bag 5     2. Turning head  5    3.     4.    5.    Score 5/10    Total score = sum of the activity scores/number of activities Minimum detectable change (90%CI) for average score = 2 points Minimum detectable change (90%CI) for single activity score = 3 points    POSTURE:  rounded shoulders and forward head    CERVICAL ROM:   Active ROM A/PROM (deg) eval 04/02/24  Flexion 40 60*  Extension 30 22*  Right lateral flexion 20 25*  Left lateral flexion 10 20*  Right rotation 50 70*  Left rotation 50 60*   (Blank rows = not tested)   UPPER EXTREMITY ROM:WFL grossly bilat UPPER EXTREMITY MMT:  MMT Right eval Left eval  Shoulder flexion 4+ 4+  Shoulder extension    Shoulder abduction 4 4  Shoulder adduction    Shoulder extension    Shoulder internal rotation 5 5  Shoulder external rotation 4 4  Middle trapezius    Lower trapezius    Elbow flexion 4 4  Elbow extension 4 4  Wrist flexion    Wrist extension    Wrist ulnar deviation    Wrist radial deviation    Wrist pronation    Wrist supination    Grip strength 5 5   (Blank rows = not tested)                                                                                                                           TREATMENT DATE:   04/02/24  UBE L2 x4 min forward/4 backward for w/u Cervical ROM check, goals   Education  on progress with PT and goals so far, POC moving forward, encouraged HEP   Thoracic flexion/extension AROM x10  Thoracic lateral flexion AROM x20 alternating Thoracic rotation AROM x20 alternating  Cervical AROM all directions x10 each Corner stretch 3x30 seconds  Backward shoulder rolls x20 Rows green TB x15 Shoulder extensions green TB x15 Wall snow angels with back of head to wall x10  Chin tucks against wall 10x3 seconds  Deferred STM, did not ID any significant trigger points TTP in  upper traps or neck today     03/30/24 Therex: UBE L1 X 3 min forward, 3 min reverse HEP review with demonstration, education. Moist heat applied to neck X 10 min during HEP review and education  Cervical rotation stretch with strap AAROM 5 sec X 10 Cervical isometrics next lateral flexion, extension, flexion, all 10 reps each holding 5 seconds  Theractivity Standing shoulder rows Green 2X10 Standing shoulder extensions green 2X10 Standing shoulder ER greeen 2X10 Standing horizontal abduction green 2X10  Manual therapy Deep tissue mobilization, trigger point release to upper traps bilat     PATIENT EDUCATION: Education details: HEP, PT plan of care Person educated: Patient Education method: Explanation, Demonstration, Verbal cues, and Handouts Education comprehension: verbalized understanding, further education recommended   HOME EXERCISE PROGRAM: Access Code: [email or web address] URL: [email or web address] Date: 03/26/2024 Prepared by: Redell Moose  Exercises - Seated Assisted Cervical Rotation with Towel  - 2 x daily - 6 x weekly - 1 sets - 10 reps - 5 hold - Standing Isometric Cervical Sidebending with Manual Resistance  - 2 x daily - 6 x weekly - 1 sets - 10 reps - 5 sec hold - Standing Isometric Cervical Flexion with Manual Resistance  - 2 x daily - 6 x weekly - 1 sets - 10 reps - 5 sec hold - Standing Isometric Cervical Extension with Manual Resistance  - 2 x daily - 6 x weekly - 1 sets - 10 reps - 5 hold - Standing Shoulder Row with Anchored Resistance  - 1 x daily - 6 x weekly - 2 sets - 10 reps - Shoulder extension with resistance - Neutral  - 1 x daily - 6 x weekly - 2 sets - 10 reps - Standing Shoulder External Rotation with Resistance  - 1 x daily - 6 x weekly - 2 sets - 10 reps - Standing Shoulder Horizontal Abduction with Resistance  - 1 x daily - 6 x weekly - 2 sets - 10 reps  ASSESSMENT:  CLINICAL IMPRESSION:   Arrives today doing OK, we continued  working on cervical ROM and postural retraining. Did well and tolerated all interventions without difficulty, will continue to challenge him. Needed Mod multimodal cues for good form with new exercises today, had a bit more shoulder pain than his normal too. Reprinted HEP and provided green TB today as well.   Per Eval: Patient referred to PT for Chronic neck pain, cervical spondylosis, DDD, reversal of lordosis. He has neck stiffness and tightness limiting his ROM as well as some overall UE weakness bilat. Patient will benefit from skilled PT to improve overall function and to address impairments and limitations listed below.  OBJECTIVE IMPAIRMENTS: decreased activity tolerance for ADL's, decreased endurance, decreased mobility, decreased ROM, decreased strength, impaired flexibility, impaired UE and pain.  ACTIVITY LIMITATIONS: bending, liftting, walking, standing, cleaning, community activity, driving, reaching, carry, occupation  PERSONAL FACTORS: see above PMH  also affecting patient's functional outcome.  REHAB POTENTIAL: Good  CLINICAL DECISION MAKING: Evolving/moderate  complexity  EVALUATION COMPLEXITY: Moderate    GOALS: Short term PT Goals Target date: 04/23/2024   Pt will be I and compliant with HEP. Baseline:  Goal status: Ongoing 04/02/24  Pt will decrease pain by 25% overall Baseline: Goal status: Ongoing 04/02/24  Long term PT goals Target date:05/21/2024   Pt will improve neck ROM to [facility] at least 60 deg rotation and 20 deg lateral flexion to improve functional mobility Baseline: Goal status: MET 04/02/24 Pt will improve  strength to at least 4+/5 MMT to improve functional strength Baseline: Goal status: New Pt will improve Patient specific functional scale (PSFS) to at least 7/10 to show improved function level Baseline: Goal status: New Pt will reduce pain to overall less than 3/10 with usual activity and work activity. Baseline: Goal status:  New  PLAN: PT FREQUENCY: 1-3 times per week   PT DURATION: 6-8 weeks  PLANNED INTERVENTIONS  97110-Therapeutic exercises, 97530- Therapeutic activity, [id number]- Neuromuscular re-education, 97535- Self Care, 02859- Manual therapy, [id number]- Electrical stimulation (unattended), 02987- Traction (mechanical), and Patient/Family education  PLAN FOR NEXT SESSION: He says he was told he has 2# weight restriction. Work on neck ROM and strength for neck/shoulders as tolerated with bands or 2# max. General endurance and conditioning as able   Next MD appt: ?  Josette Rough, PT, DPT 04/02/24 9:19 AM

## 2024-04-06 NOTE — H&P (View-Only) (Signed)
 GI Office Note    Referring Provider: Trudy Vaughn FALCON, MD Primary Care Physician:  Trudy Vaughn FALCON, MD  Primary Gastroenterologist: [name].Rourk, MD  Chief Complaint   Chief Complaint  Patient presents with   Follow-up    Follow up. No problem    History of Present Illness   Charles Ayala is a 73 y.o. male presenting today at the request of Trudy Vaughn FALCON, MD for anemia and consideration of surveillance colonoscopy.  EGD March 2019: - Normal esophagus.  - Normal stomach.  - Normal duodenal bulb and second portion of the duodenum.  - No specimens collected.  Colonoscopy March 2019: - 2 polyps removed (Tubular adenomas) - Diverticulosis in the sigmoid colon. - Colonic lipoma  - The examination was otherwise normal on direct and retroflexion views.  Last seen in June 2019. Reported good appetite, snacking lots. Rare nausea when cooking breakfast. No vomiting or abdominal pain. Had documented weight loss of about 10 pounds. Reported some frequent stools and advised trial of Zenpep. CXR and CT A/P ordered for further evaluation.   CT A/P with contrast 12/17/2017: - prominent stool throughout colon.  -diffuse hepatic steatosis - moderate prostatic hypertrophy  Labs August 2025 via pcp - Hgb 12.1. PCP advised anemia panel and given GI referral for colonoscopy. Normal renal function and LFTs.   Today:  Discussed the use of AI scribe software for clinical note transcription with the patient, who gave verbal consent to proceed.  He has a history of mild anemia for at least three years. Previous tests have been conducted, but the specific results of an iron panel are not available at this time. He does not currently take any iron supplements.  He has regular bowel movements without issues such as straining, blood, or melena within stools. His stools are typically a normal dark brown color, with slight variations based on diet. No abdominal pain, diarrhea, or sick  contacts. No nausea, vomiting, or dysphagia. No reflux.   He takes Advil twice a day, likely for arthritis-related pain, but is unsure of the dose. He has been using Advil for at least a year. He also takes Lyrica, which helps with neck pain.  His weight has been stable, with a recent weight of 168 pounds, showing a slight fluctuation from 170 pounds in June. He reports a good appetite and enjoys eating, particularly chicken.  He has a history of aortic stenosis and has had echocardiograms in the past. He visited cardiology in June and is scheduled to return next month. No chest pain or dyspnea. mild AS noted on echo in 2024.   He has no history of alcohol  or tobacco use, although [place] and his father smoked. He never picked up the habit himself.      Wt Readings from Last 6 Encounters:  04/07/24 168 lb (76.2 kg)  11/26/23 170 lb 3.2 oz (77.2 kg)  07/18/23 173 lb (78.5 kg)  01/15/23 174 lb (78.9 kg)  09/03/22 162 lb 9.6 oz (73.8 kg)  05/15/22 175 lb 3.2 oz (79.5 kg)    Body mass index is 22.78 kg/m.  Current Outpatient Medications  Medication Sig Dispense Refill   aspirin  EC 81 MG tablet Take 1 tablet (81 mg total) by mouth daily. Swallow whole.     hydrochlorothiazide  (HYDRODIURIL ) 25 MG tablet Take 1 tablet (25 mg total) by mouth daily. 90 tablet 1   pregabalin (LYRICA) 100 MG capsule Take 100 mg by mouth 2 (  two) times daily.     rosuvastatin  (CRESTOR ) 40 MG tablet Take 1 tablet (40 mg total) by mouth daily. 90 tablet 3   tamsulosin  (FLOMAX ) 0.4 MG CAPS capsule Take 1 capsule (0.4 mg total) daily by mouth. 90 capsule 3   rosuvastatin  (CRESTOR ) 20 MG tablet TAKE 1 TABLET BY MOUTH EVERY DAY 90 tablet 1   No current facility-administered medications for this visit.    Past Medical History:  Diagnosis Date   Anemia    Aortic stenosis    Arthritis    Bug bite 09/02/2019   Encounter for hepatitis C screening test for low risk patient 07/31/2017   Hammer  toe of left foot 07/31/2017   High risk medication use 07/31/2017   History of stomach ulcers    Hyperlipidemia    Hyperlipidemia LDL goal <100 07/31/2017   Hypertension    Nausea without vomiting 07/22/2017   Neuropathy    PAD (peripheral artery disease)    Spondylolysis of lumbar region 02/26/2017   Tenosynovitis of thumb 08/16/2019    Past Surgical History:  Procedure Laterality Date   BACK SURGERY  1997   L4 L5   CARPAL TUNNEL RELEASE Right    COLONOSCOPY  02/2009   Dr. Golda: few sigmoid diverticula, one at cecum, small submucosal lipoma at prox transverse colon   COLONOSCOPY WITH PROPOFOL  N/A 08/22/2017   Dr. Shaaron: two tubular adenomas, sigmoid diverticulosis, colonic lipoma   ESOPHAGOGASTRODUODENOSCOPY (EGD) WITH PROPOFOL  N/A 08/22/2017   Dr. Shaaron: normal esophagus, normal stomach, normal duodenum   POLYPECTOMY  08/22/2017   Procedure: POLYPECTOMY;  Surgeon: Shaaron [name], MD;  Location: [facility];  Service: Endoscopy;;  sigmoid    Family History  Problem Relation Age of Onset   Parkinson's disease Mother    Hypertension Mother    Hypertension Father    Cancer Sister        Chemo related lung problems   Diabetes Son    Colon cancer Neg Hx    Liver disease Neg Hx    Pancreatic cancer Neg Hx     Allergies as of 04/07/2024   (No Known Allergies)    Social History   Socioeconomic History   Marital status: Married    Spouse name: Education Administrator   Number of children: 4   Years of education: Not on file   Highest education level: Associate degree: occupational, scientist, product/process development, or vocational program  Occupational History   Occupation: retired  Tobacco Use   Smoking status: Never   Smokeless tobacco: Never  Vaping Use   Vaping status: Never Used  Substance and Sexual Activity   Alcohol  use: No   Drug use: No   Sexual activity: Yes  Other Topics Concern   Not on file  Social History Narrative   Retired. Used to work at [employer].       Married 42 years     Lives in [place].   Has four children. 15 grandchildren, 5 great grandchildren       Enjoys gardening and playing with grandchildren.       Diet: veggies, limited red meat, most chicken and fish, fruits   Caffeine: 2 cups of coffee, no sodas   Water: 6-8 cups       Wears seat belt    Smoke detectors at home   Does not use phone while driving   Social Drivers of Health   Financial Resource Strain: Low Risk  (12/11/2021)   Overall Financial Resource Strain (CARDIA)  Difficulty of Paying Living Expenses: Not hard at all  Food Insecurity: No Food Insecurity (12/11/2021)   Hunger Vital Sign    Worried About Running Out of Food in the Last Year: Never true    Ran Out of Food in the Last Year: Never true  Transportation Needs: No Transportation Needs (12/11/2021)   PRAPARE - Administrator, Civil Service (Medical): No    Lack of Transportation (Non-Medical): No  Physical Activity: Sufficiently Active (12/11/2021)   Exercise Vital Sign    Days of Exercise per Week: 7 days    Minutes of Exercise per Session: 30 min  Stress: No Stress Concern Present (12/11/2021)   [employer] of Occupational Health - Occupational Stress Questionnaire    Feeling of Stress : Not at all  Social Connections: Moderately Integrated (12/11/2021)   Social Connection and Isolation Panel    Frequency of Communication with Friends and Family: Three times a week    Frequency of Social Gatherings with Friends and Family: Twice a week    Attends Religious Services: 1 to 4 times per year    Active Member of [employer] or Organizations: No    Attends Banker Meetings: Never    Marital Status: Married  Catering Manager Violence: Not At Risk (12/11/2021)   Humiliation, Afraid, Rape, and Kick questionnaire    Fear of Current or Ex-Partner: No    Emotionally Abused: No    Physically Abused: No    Sexually Abused: No     Review of Systems   Gen: Denies any fever, chills, fatigue, weight  loss, lack of appetite.  CV: Denies chest pain, heart palpitations, peripheral edema, syncope.  Resp: Denies shortness of breath at rest or with exertion. Denies wheezing or cough.  GI: see HPI GU : Denies urinary burning, urinary frequency, urinary hesitancy MS: Denies joint pain, muscle weakness, cramps, or limitation of movement.  Derm: Denies rash, itching, dry skin Psych: Denies depression, anxiety, memory loss, and confusion Heme: Denies bruising, bleeding, and enlarged lymph nodes.  Physical Exam   BP 138/67 (BP Location: Right Arm, Patient Position: Sitting, Cuff Size: Large)   Pulse (!) 56   Temp 97.6 F (36.4 C) (Temporal)   Ht 6' (1.829 m)   Wt 168 lb (76.2 kg)   BMI 22.78 kg/m   General:   Alert and oriented. Pleasant and cooperative. Head:  Normocephalic and atraumatic. Eyes:  Without icterus, sclera clear and conjunctiva pink.  Ears:  Normal auditory acuity. Mouth:  No deformity or lesions, oral mucosa pink.  Lungs:  Clear to auscultation bilaterally. No wheezes, rales, or rhonchi. No distress.  Heart:  S1, S2. Systolic murmur present.  Abdomen:  +BS, soft, non-tender and non-distended. No HSM noted. No guarding or rebound. No masses appreciated.  Rectal:  deferred  Msk:  Symmetrical without gross deformities. Normal posture. Extremities:  Without edema. Neurologic:  Alert and  oriented x4;  grossly normal neurologically. Skin:  Intact without significant lesions or rashes. Psych:  Alert and cooperative. Normal mood and affect.  Assessment & Plan   Charles Ayala is a 73 y.o. male with a history of HTN, HLD, aortic stenosis (moderate), history of DVT, chronic pain, and anemia presen.    Colorectal cancer screening with colonoscopy Colonoscopy is indicated for colorectal cancer screening. Colonoscopy in 2019 with polyps. No family history of colorectal cancer. Regular bowel movements without straining, blood, or melena. No significant weight loss or appetite  changes. No abdominal pain, diarrhea, or  sick contacts. No chest pain or dyspnea. Aortic stenosis is moderate, requiring cardiology clearance for anesthesia safety. AS mild on last echo - no SOB or chest pain. Stable at last cardiology appointment.  - Will schedule colonoscopy for eval of anemia and colon polyps  Anemia evaluation in the setting of chronic NSAID use Chronic mild anemia with hemoglobin at 12, slightly below the normal range for men (13-15). Long-term NSAID use (Advil) may contribute to gastrointestinal bleeding and anemia. No current symptoms of gastrointestinal bleeding. Previous evaluations did not reveal significant findings, but current NSAID use may have changed the condition. No protective medications for the stomach are currently being taken. Upper endoscopy is considered to evaluate for potential gastrointestinal bleeding or inflammation due to NSAID use. - Requested iron panel results from primary care to assess iron levels. - Will schedule upper endoscopy concurrently with colonoscopy to evaluate for potential gastrointestinal bleeding or inflammation due to NSAID use. - Will consider starting a low-dose medication to protect the stomach if mild inflammation is noted during endoscopy. - Need to limit NSAID use.      Follow up   Follow up  post procedure  Request PCP labs - anemia panel   Charmaine Melia, MSN, FNP-BC, AGACNP-BC [facility]

## 2024-04-06 NOTE — Progress Notes (Unsigned)
 GI Office Note    Referring Provider: Trudy Vaughn FALCON, MD Primary Care Physician:  Trudy Vaughn FALCON, MD  Primary Gastroenterologist: [name].Rourk, MD  Chief Complaint   No chief complaint on file.  History of Present Illness   Charles Ayala is a 73 y.o. male presenting today at the request of Trudy Vaughn FALCON, MD for anemia and consideration of surveillance colonoscopy.  EGD March 2019: - Normal esophagus.  - Normal stomach.  - Normal duodenal bulb and second portion of the duodenum.  - No specimens collected.  Colonoscopy March 2019: - 2 polyps removed (Tubular adenomas) - Diverticulosis in the sigmoid colon. - Colonic lipoma  - The examination was otherwise normal on direct and retroflexion views.  Last seen in June 2019. Reported good appetite, snacking lots. Rare nausea when cooking breakfast. No vomiting or abdominal pain. Had documented weight loss of about 10 pounds. Reported some frequent stools and advised trial of Zenpep. CXR and CT A/P ordered for further evaluation.   CT A/P with contrast 12/17/2017: - prominent stool throughout colon.  -diffuse hepatic steatosis - moderate prostatic hypertrophy  Labs August 2025 via pcp - Hgb 12.1. PCP advised anemia panle and givne GI referral for colonoscopy.  Today:  Discussed the use of AI scribe software for clinical note transcription with the patient, who gave verbal consent to proceed.    Wt Readings from Last 6 Encounters:  11/26/23 170 lb 3.2 oz (77.2 kg)  07/18/23 173 lb (78.5 kg)  01/15/23 174 lb (78.9 kg)  09/03/22 162 lb 9.6 oz (73.8 kg)  05/15/22 175 lb 3.2 oz (79.5 kg)  10/05/21 167 lb (75.8 kg)    There is no height or weight on file to calculate BMI.  Current Outpatient Medications  Medication Sig Dispense Refill   aspirin  EC 81 MG tablet Take 1 tablet (81 mg total) by mouth daily. Swallow whole.     hydrochlorothiazide  (HYDRODIURIL ) 25 MG tablet Take 1 tablet (25 mg total) by mouth  daily. 90 tablet 1   pregabalin (LYRICA) 100 MG capsule Take 100 mg by mouth 2 (two) times daily.     rosuvastatin  (CRESTOR ) 20 MG tablet TAKE 1 TABLET BY MOUTH EVERY DAY 90 tablet 1   rosuvastatin  (CRESTOR ) 40 MG tablet Take 1 tablet (40 mg total) by mouth daily. 90 tablet 3   tamsulosin  (FLOMAX ) 0.4 MG CAPS capsule Take 1 capsule (0.4 mg total) daily by mouth. 90 capsule 3   No current facility-administered medications for this visit.    Past Medical History:  Diagnosis Date   Anemia    Aortic stenosis    Arthritis    Bug bite 09/02/2019   Encounter for hepatitis C screening test for low risk patient 07/31/2017   Hammer toe of left foot 07/31/2017   High risk medication use 07/31/2017   History of stomach ulcers    Hyperlipidemia    Hyperlipidemia LDL goal <100 07/31/2017   Hypertension    Nausea without vomiting 07/22/2017   Neuropathy    PAD (peripheral artery disease)    Spondylolysis of lumbar region 02/26/2017   Tenosynovitis of thumb 08/16/2019    Past Surgical History:  Procedure Laterality Date   BACK SURGERY  1997   L4 L5   CARPAL TUNNEL RELEASE Right    COLONOSCOPY  02/2009   Dr. Golda: few sigmoid diverticula, one at cecum, small submucosal lipoma at prox transverse colon   COLONOSCOPY WITH PROPOFOL  N/A 08/22/2017   Dr. Shaaron: two tubular  adenomas, sigmoid diverticulosis, colonic lipoma   ESOPHAGOGASTRODUODENOSCOPY (EGD) WITH PROPOFOL  N/A 08/22/2017   Dr. Shaaron: normal esophagus, normal stomach, normal duodenum   POLYPECTOMY  08/22/2017   Procedure: POLYPECTOMY;  Surgeon: Shaaron [name], MD;  [facility];  Service: Endoscopy;;  sigmoid    Family History  Problem Relation Age of Onset   Parkinson's disease Mother    Hypertension Mother    Hypertension Father    Cancer Sister        Chemo related lung problems   Diabetes Son    Colon cancer Neg Hx    Liver disease Neg Hx    Pancreatic cancer Neg Hx     Allergies as of 04/07/2024   (No Known  Allergies)    Social History   Socioeconomic History   Marital status: Married    Spouse name: Education administrator   Number of children: 4   Years of education: Not on file   Highest education level: Associate degree: occupational, Scientist, product/process development, or vocational program  Occupational History   Occupation: retired  Tobacco Use   Smoking status: Never   Smokeless tobacco: Never  Vaping Use   Vaping status: Never Used  Substance and Sexual Activity   Alcohol  use: No   Drug use: No   Sexual activity: Yes  Other Topics Concern   Not on file  Social History Narrative   Retired. Used to work at [employer].       Married 42 years    Lives in [place].   Has four children. 15 grandchildren, 5 great grandchildren       Enjoys gardening and playing with grandchildren.       Diet: veggies, limited red meat, most chicken and fish, fruits   Caffeine: 2 cups of coffee, no sodas   Water: 6-8 cups       Wears seat belt    Smoke detectors at home   Does not use phone while driving   Social Drivers of Health   Financial Resource Strain: Low Risk  (12/11/2021)   Overall Financial Resource Strain (CARDIA)    Difficulty of Paying Living Expenses: Not hard at all  Food Insecurity: No Food Insecurity (12/11/2021)   Hunger Vital Sign    Worried About Running Out of Food in the Last Year: Never true    Ran Out of Food in the Last Year: Never true  Transportation Needs: No Transportation Needs (12/11/2021)   PRAPARE - Administrator, Civil Service (Medical): No    Lack of Transportation (Non-Medical): No  Physical Activity: Sufficiently Active (12/11/2021)   Exercise Vital Sign    Days of Exercise per Week: 7 days    Minutes of Exercise per Session: 30 min  Stress: No Stress Concern Present (12/11/2021)   [employer] of Occupational Health - Occupational Stress Questionnaire    Feeling of Stress : Not at all  Social Connections: Moderately Integrated (12/11/2021)   Social Connection  and Isolation Panel    Frequency of Communication with Friends and Family: Three times a week    Frequency of Social Gatherings with Friends and Family: Twice a week    Attends Religious Services: 1 to 4 times per year    Active Member of [employer] or Organizations: No    Attends Banker Meetings: Never    Marital Status: Married  Catering manager Violence: Not At Risk (12/11/2021)   Humiliation, Afraid, Rape, and Kick questionnaire    Fear of Current or  Ex-Partner: No    Emotionally Abused: No    Physically Abused: No    Sexually Abused: No     Review of Systems   Gen: Denies any fever, chills, fatigue, weight loss, lack of appetite.  CV: Denies chest pain, heart palpitations, peripheral edema, syncope.  Resp: Denies shortness of breath at rest or with exertion. Denies wheezing or cough.  GI: see HPI GU : Denies urinary burning, urinary frequency, urinary hesitancy MS: Denies joint pain, muscle weakness, cramps, or limitation of movement.  Derm: Denies rash, itching, dry skin Psych: Denies depression, anxiety, memory loss, and confusion Heme: Denies bruising, bleeding, and enlarged lymph nodes.  Physical Exam   There were no vitals taken for this visit.  General:   Alert and oriented. Pleasant and cooperative. Well-nourished and well-developed.  Head:  Normocephalic and atraumatic. Eyes:  Without icterus, sclera clear and conjunctiva pink.  Ears:  Normal auditory acuity. Mouth:  No deformity or lesions, oral mucosa pink.  Lungs:  Clear to auscultation bilaterally. No wheezes, rales, or rhonchi. No distress.  Heart:  S1, S2 present without murmurs appreciated.  Abdomen:  +BS, soft, non-tender and non-distended. No HSM noted. No guarding or rebound. No masses appreciated.  Rectal:  deferred *** Msk:  Symmetrical without gross deformities. Normal posture. Extremities:  Without edema. Neurologic:  Alert and  oriented x4;  grossly normal neurologically. Skin:  Intact  without significant lesions or rashes. Psych:  Alert and cooperative. Normal mood and affect.  Assessment & Plan   Lavin Petteway is a 73 y.o. male with a history of *** presenting today with ***     Follow up   ***Follow up ***   Charmaine Melia, MSN, FNP-BC, AGACNP-BC [facility]

## 2024-04-07 ENCOUNTER — Ambulatory Visit: Admitting: Gastroenterology

## 2024-04-07 ENCOUNTER — Ambulatory Visit: Admitting: Physical Therapy

## 2024-04-07 ENCOUNTER — Telehealth: Payer: Self-pay | Admitting: *Deleted

## 2024-04-07 ENCOUNTER — Encounter: Payer: Self-pay | Admitting: Gastroenterology

## 2024-04-07 VITALS — BP 138/67 | HR 56 | Temp 97.6°F | Ht 72.0 in | Wt 168.0 lb

## 2024-04-07 DIAGNOSIS — Z5181 Encounter for therapeutic drug level monitoring: Secondary | ICD-10-CM | POA: Diagnosis not present

## 2024-04-07 DIAGNOSIS — Z860100 Personal history of colon polyps, unspecified: Secondary | ICD-10-CM | POA: Diagnosis not present

## 2024-04-07 DIAGNOSIS — D509 Iron deficiency anemia, unspecified: Secondary | ICD-10-CM

## 2024-04-07 DIAGNOSIS — Z791 Long term (current) use of non-steroidal anti-inflammatories (NSAID): Secondary | ICD-10-CM

## 2024-04-07 NOTE — Patient Instructions (Addendum)
 We will get you scheduled for colonoscopy and upper endoscopy in the near future with Dr. Shaaron.  Please fill out request form upfront to get your lab results from primary care.  Try to limit Advil use is much as possible as this could be contributing to your anemia.  If your labs indicate some iron deficiency and then I will recommend iron supplementation, please wait to hear from our office before starting anything.  It was a pleasure to see you today. I want to create trusting relationships with patients. If you receive a survey regarding your visit,  I greatly appreciate you taking time to fill this out on paper or through your MyChart. I value your feedback.  Charmaine Melia, MSN, FNP-BC, AGACNP-BC [facility]

## 2024-04-07 NOTE — Telephone Encounter (Signed)
 LMOVM to return call  TCS/EGD w/Dr.Rourk, asa 3

## 2024-04-09 ENCOUNTER — Ambulatory Visit: Admitting: Physical Therapy

## 2024-04-09 ENCOUNTER — Encounter: Payer: Self-pay | Admitting: Physical Therapy

## 2024-04-09 DIAGNOSIS — M542 Cervicalgia: Secondary | ICD-10-CM

## 2024-04-09 DIAGNOSIS — M6281 Muscle weakness (generalized): Secondary | ICD-10-CM

## 2024-04-09 NOTE — Therapy (Signed)
 OUTPATIENT PHYSICAL THERAPY TREATMENT    Patient Name: Charles Ayala MRN: [id number] DOB:1950/10/14, 73 y.o., male Today's Date: 04/09/2024  END OF SESSION:  PT End of Session - 04/09/24 0841     Visit Number 4    Number of Visits 12    Date for Recertification  05/21/24    PT Start Time 0836    PT Stop Time 0916    PT Time Calculation (min) 40 min    Activity Tolerance Patient tolerated treatment well    Behavior During Therapy [facility] for tasks assessed/performed           Past Medical History:  Diagnosis Date   Anemia    Aortic stenosis    Arthritis    Bug bite 09/02/2019   Encounter for hepatitis C screening test for low risk patient 07/31/2017   Hammer toe of left foot 07/31/2017   High risk medication use 07/31/2017   History of stomach ulcers    Hyperlipidemia    Hyperlipidemia LDL goal <100 07/31/2017   Hypertension    Nausea without vomiting 07/22/2017   Neuropathy    PAD (peripheral artery disease)    Spondylolysis of lumbar region 02/26/2017   Tenosynovitis of thumb 08/16/2019   Past Surgical History:  Procedure Laterality Date   BACK SURGERY  1997   L4 L5   CARPAL TUNNEL RELEASE Right    COLONOSCOPY  02/2009   Dr. Golda: few sigmoid diverticula, one at cecum, small submucosal lipoma at prox transverse colon   COLONOSCOPY WITH PROPOFOL  N/A 08/22/2017   Dr. Shaaron: two tubular adenomas, sigmoid diverticulosis, colonic lipoma   ESOPHAGOGASTRODUODENOSCOPY (EGD) WITH PROPOFOL  N/A 08/22/2017   Dr. Shaaron: normal esophagus, normal stomach, normal duodenum   POLYPECTOMY  08/22/2017   Procedure: POLYPECTOMY;  Surgeon: Shaaron [name], MD;  [facility];  Service: Endoscopy;;  sigmoid   Patient Active Problem List   Diagnosis Date Noted   Bradycardia 02/02/2021   Bilateral hand pain 05/20/2020   Annual visit for general adult medical examination with abnormal findings 11/13/2019   Heart murmur 11/13/2019   Mixed hyperlipidemia 11/13/2019   Tubular  adenoma of colon 09/02/2017   Meralgia paresthetica of right side 08/26/2017   Normocytic anemia 07/22/2017   Intervertebral disc disorders with myelopathy of lumbar region 02/26/2017   Essential hypertension 02/22/2017   Benign prostatic hyperplasia without lower urinary tract symptoms 02/22/2017    ERE:Tpoopjfd, Vaughn FALCON, MD   REFERRING PROVIDER: Trudy Vaughn FALCON, MD   REFERRING DIAG: M54.2 (ICD-10-CM) - Cervicalgia   Rationale for Evaluation and Treatment:  Rehabiliation  THERAPY DIAG:  Cervicalgia  Muscle weakness (generalized)  ONSET DATE: months of pain   SUBJECTIVE:  SUBJECTIVE STATEMENT: Relays some pain in his shoulder at times.   PERTINENT HISTORY:  See above PMH, previous back surgery  PAIN:  NPRS scale: does not rate pain today, it is difficult for him to give a number on it, just describes the symptoms but can't rate the intensity. Pain location:neck and shoulders on both sides Pain description: its different, less than it was before  Aggravating factors: turning, reach, lift, carry, mornings are more painful  Relieving factors: medication sometimes work   PRECAUTIONS: ,  None  RED FLAGS: None   WEIGHT BEARING RESTRICTIONS:  No  FALLS:  Has patient fallen in last 6 months? No   OCCUPATION:  retired  PLOF:  Independent  PATIENT GOALS:  Reduce pain, be able to improve function more  OBJECTIVE:  Note: Objective measures were completed at Evaluation unless otherwise noted.  DIAGNOSTIC FINDINGS:  Neck XR 2023 impression cervical spondylosis, DDD, reversal of lordosis   PATIENT SURVEYS:  Patient-Specific Activity Scoring Scheme  0 represents "unable to perform." 10 represents "able to perform at prior level. 0 1 2 3 4 5 6 7 8 9  10 (Date and  Score)  *It is difficult for him to provide numbers, has to be prompted several times to provide number instead of continuing to describe symptoms Activity Eval     1. Lifting 5 # bag 5     2. Turning head  5    3.     4.    5.    Score 5/10    Total score = sum of the activity scores/number of activities Minimum detectable change (90%CI) for average score = 2 points Minimum detectable change (90%CI) for single activity score = 3 points    POSTURE:  rounded shoulders and forward head    CERVICAL ROM:   Active ROM A/PROM (deg) eval 04/02/24  Flexion 40 60*  Extension 30 22*  Right lateral flexion 20 25*  Left lateral flexion 10 20*  Right rotation 50 70*  Left rotation 50 60*   (Blank rows = not tested)   UPPER EXTREMITY ROM:WFL grossly bilat UPPER EXTREMITY MMT:  MMT Right eval Left eval  Shoulder flexion 4+ 4+  Shoulder extension    Shoulder abduction 4 4  Shoulder adduction    Shoulder extension    Shoulder internal rotation 5 5  Shoulder external rotation 4 4  Middle trapezius    Lower trapezius    Elbow flexion 4 4  Elbow extension 4 4  Wrist flexion    Wrist extension    Wrist ulnar deviation    Wrist radial deviation    Wrist pronation    Wrist supination    Grip strength 5 5   (Blank rows = not tested)                                                                                                                           TREATMENT DATE:  04/09/24 UBE L2  x 10 min switch directions half way Cervical rotation stretch with strap AAROM 5 sec X 10 Cervical isometrics next lateral flexion, extension, flexion, all 10 reps each holding 5 seconds  Rows green TB 2x12 Shoulder extensions green TB 2X12 Bilat shoulder ER green 2X12 Bilat shoulder horizontal abduction green 2X12 Body blade 20 sec X 2 chest press, 20 sec X 2 horizontal abd/add, 20 sec X 2 out in front superior/inferior, 20 sec X 1 each side for scaption  04/02/24  UBE L2 x4 min  forward/4 backward for w/u Cervical ROM check, goals   Education on progress with PT and goals so far, POC moving forward, encouraged HEP   Thoracic flexion/extension AROM x10  Thoracic lateral flexion AROM x20 alternating Thoracic rotation AROM x20 alternating  Cervical AROM all directions x10 each Corner stretch 3x30 seconds  Backward shoulder rolls x20 Rows green TB x15 Shoulder extensions green TB x15 Wall snow angels with back of head to wall x10  Chin tucks against wall 10x3 seconds  Deferred STM, did not ID any significant trigger points TTP in upper traps or neck today    PATIENT EDUCATION: Education details: HEP, PT plan of care Person educated: Patient Education method: Explanation, Demonstration, Verbal cues, and Handouts Education comprehension: verbalized understanding, further education recommended   HOME EXERCISE PROGRAM: Access Code: [email or web address] URL: [email or web address] Date: 03/26/2024 Prepared by: Redell Moose  Exercises - Seated Assisted Cervical Rotation with Towel  - 2 x daily - 6 x weekly - 1 sets - 10 reps - 5 hold - Standing Isometric Cervical Sidebending with Manual Resistance  - 2 x daily - 6 x weekly - 1 sets - 10 reps - 5 sec hold - Standing Isometric Cervical Flexion with Manual Resistance  - 2 x daily - 6 x weekly - 1 sets - 10 reps - 5 sec hold - Standing Isometric Cervical Extension with Manual Resistance  - 2 x daily - 6 x weekly - 1 sets - 10 reps - 5 hold - Standing Shoulder Row with Anchored Resistance  - 1 x daily - 6 x weekly - 2 sets - 10 reps - Shoulder extension with resistance - Neutral  - 1 x daily - 6 x weekly - 2 sets - 10 reps - Standing Shoulder External Rotation with Resistance  - 1 x daily - 6 x weekly - 2 sets - 10 reps - Standing Shoulder Horizontal Abduction with Resistance  - 1 x daily - 6 x weekly - 2 sets - 10 reps  ASSESSMENT:  CLINICAL IMPRESSION: he does continue to report some shoulder complaints.  Worked more on shoulder exercises and strengthening today as well as incorporated body blade for coordination/stabilization. PT recommending to continue current POC.   Per Eval: Patient referred to PT for Chronic neck pain, cervical spondylosis, DDD, reversal of lordosis. He has neck stiffness and tightness limiting his ROM as well as some overall UE weakness bilat. Patient will benefit from skilled PT to improve overall function and to address impairments and limitations listed below.  OBJECTIVE IMPAIRMENTS: decreased activity tolerance for ADL's, decreased endurance, decreased mobility, decreased ROM, decreased strength, impaired flexibility, impaired UE and pain.  ACTIVITY LIMITATIONS: bending, liftting, walking, standing, cleaning, community activity, driving, reaching, carry, occupation  PERSONAL FACTORS: see above PMH  also affecting patient's functional outcome.  REHAB POTENTIAL: Good  CLINICAL DECISION MAKING: Evolving/moderate complexity  EVALUATION COMPLEXITY: Moderate    GOALS: Short term PT Goals Target date: 04/23/2024   Pt will be I  and compliant with HEP. Baseline:  Goal status: Ongoing 04/02/24  Pt will decrease pain by 25% overall Baseline: Goal status: Ongoing 04/02/24  Long term PT goals Target date:05/21/2024   Pt will improve neck ROM to [facility] at least 60 deg rotation and 20 deg lateral flexion to improve functional mobility Baseline: Goal status: MET 04/02/24 Pt will improve  strength to at least 4+/5 MMT to improve functional strength Baseline: Goal status: New Pt will improve Patient specific functional scale (PSFS) to at least 7/10 to show improved function level Baseline: Goal status: New Pt will reduce pain to overall less than 3/10 with usual activity and work activity. Baseline: Goal status: New  PLAN: PT FREQUENCY: 1-3 times per week   PT DURATION: 6-8 weeks  PLANNED INTERVENTIONS  97110-Therapeutic exercises, 97530- Therapeutic activity,  [id number]- Neuromuscular re-education, 97535- Self Care, [id number]- Manual therapy, [id number]- Electrical stimulation (unattended), 02987- Traction (mechanical), and Patient/Family education  PLAN FOR NEXT SESSION: He says he was told he has 2# weight restriction. Work on neck ROM and strength for neck/shoulders as tolerated with bands or 2# max. General endurance and conditioning as able   Next MD appt: nothing scheduled.   Redell Moose, PT, DPT 04/09/24 10:24 AM

## 2024-04-09 NOTE — Telephone Encounter (Signed)
 LMOVM to return call.

## 2024-04-13 ENCOUNTER — Encounter: Payer: Self-pay | Admitting: *Deleted

## 2024-04-13 NOTE — Telephone Encounter (Signed)
LMOVM to return call. Letter mailed

## 2024-04-14 ENCOUNTER — Ambulatory Visit: Admitting: Physical Therapy

## 2024-04-16 ENCOUNTER — Ambulatory Visit: Admitting: Physical Therapy

## 2024-04-20 ENCOUNTER — Ambulatory Visit: Admitting: Physical Therapy

## 2024-04-21 ENCOUNTER — Telehealth: Payer: Self-pay | Admitting: Gastroenterology

## 2024-04-21 NOTE — Telephone Encounter (Signed)
 Reviewed labs from PCP:  Labs in August with hemoglobin 12.1, iron 107 and normal LFTs.  Labs in September with hemoglobin 13.3, ferritin 179, iron 158, B12 413  Labs in August 2025 with hemoglobin 15.  ALT 57, creatinine 1.53, GFR 56 and A1c 6.8.  Elevation in ALT could be secondary to some mild fatty liver, mild fatty liver noted on imaging in 2019.  Could consider some additional fibrosis workup after postprocedure follow-up.  Charmaine Melia, MSN, APRN, FNP-BC, AGACNP-BC [facility]

## 2024-04-22 ENCOUNTER — Ambulatory Visit: Admitting: Physical Therapy

## 2024-04-23 ENCOUNTER — Other Ambulatory Visit: Payer: Self-pay | Admitting: *Deleted

## 2024-04-23 ENCOUNTER — Encounter: Payer: Self-pay | Admitting: *Deleted

## 2024-04-23 MED ORDER — PEG 3350-KCL-NA BICARB-NACL 420 G PO SOLR: 4000.0000 mL | Freq: Once | ORAL | 0 refills | Status: AC

## 2024-04-23 NOTE — Telephone Encounter (Signed)
 Pt has been scheduled for 04/29/24. Pt to come by office to pick up instructions. Prep sent to pharmacy.

## 2024-04-23 NOTE — Telephone Encounter (Signed)
 LMTRC  Pt left vm wanting to schedule procedure

## 2024-04-27 ENCOUNTER — Encounter (HOSPITAL_COMMUNITY)
Admission: RE | Admit: 2024-04-27 | Discharge: 2024-04-27 | Disposition: A | Discharge status: Home / Self Care | Source: Ambulatory Visit | Attending: Internal Medicine | Admitting: Internal Medicine

## 2024-04-29 ENCOUNTER — Encounter (HOSPITAL_COMMUNITY): Payer: Self-pay | Admitting: Internal Medicine

## 2024-04-29 ENCOUNTER — Ambulatory Visit (HOSPITAL_COMMUNITY): Admitting: Anesthesiology

## 2024-04-29 ENCOUNTER — Telehealth: Payer: Self-pay

## 2024-04-29 ENCOUNTER — Ambulatory Visit (HOSPITAL_COMMUNITY)
Admission: RE | Admit: 2024-04-29 | Discharge: 2024-04-29 | Disposition: A | Discharge status: Home / Self Care | Attending: Internal Medicine | Admitting: Internal Medicine

## 2024-04-29 ENCOUNTER — Encounter (HOSPITAL_COMMUNITY): Admission: RE | Disposition: A | Payer: Self-pay | Source: Home / Self Care | Attending: Internal Medicine

## 2024-04-29 DIAGNOSIS — K644 Residual hemorrhoidal skin tags: Secondary | ICD-10-CM | POA: Diagnosis not present

## 2024-04-29 DIAGNOSIS — K259 Gastric ulcer, unspecified as acute or chronic, without hemorrhage or perforation: Secondary | ICD-10-CM | POA: Diagnosis not present

## 2024-04-29 DIAGNOSIS — G709 Myoneural disorder, unspecified: Secondary | ICD-10-CM | POA: Diagnosis not present

## 2024-04-29 DIAGNOSIS — Z860100 Personal history of colon polyps, unspecified: Secondary | ICD-10-CM

## 2024-04-29 DIAGNOSIS — I10 Essential (primary) hypertension: Secondary | ICD-10-CM

## 2024-04-29 DIAGNOSIS — I739 Peripheral vascular disease, unspecified: Secondary | ICD-10-CM | POA: Diagnosis not present

## 2024-04-29 DIAGNOSIS — K449 Diaphragmatic hernia without obstruction or gangrene: Secondary | ICD-10-CM | POA: Insufficient documentation

## 2024-04-29 DIAGNOSIS — K5790 Diverticulosis of intestine, part unspecified, without perforation or abscess without bleeding: Secondary | ICD-10-CM | POA: Diagnosis not present

## 2024-04-29 DIAGNOSIS — D500 Iron deficiency anemia secondary to blood loss (chronic): Secondary | ICD-10-CM | POA: Insufficient documentation

## 2024-04-29 DIAGNOSIS — K5730 Diverticulosis of large intestine without perforation or abscess without bleeding: Secondary | ICD-10-CM | POA: Diagnosis not present

## 2024-04-29 DIAGNOSIS — K649 Unspecified hemorrhoids: Secondary | ICD-10-CM | POA: Diagnosis not present

## 2024-04-29 DIAGNOSIS — Z860101 Personal history of adenomatous and serrated colon polyps: Secondary | ICD-10-CM | POA: Diagnosis present

## 2024-04-29 DIAGNOSIS — R011 Cardiac murmur, unspecified: Secondary | ICD-10-CM | POA: Diagnosis not present

## 2024-04-29 DIAGNOSIS — Z8249 Family history of ischemic heart disease and other diseases of the circulatory system: Secondary | ICD-10-CM | POA: Diagnosis not present

## 2024-04-29 DIAGNOSIS — K641 Second degree hemorrhoids: Secondary | ICD-10-CM | POA: Insufficient documentation

## 2024-04-29 DIAGNOSIS — Z1211 Encounter for screening for malignant neoplasm of colon: Secondary | ICD-10-CM | POA: Insufficient documentation

## 2024-04-29 DIAGNOSIS — Z79899 Other long term (current) drug therapy: Secondary | ICD-10-CM | POA: Diagnosis not present

## 2024-04-29 DIAGNOSIS — I350 Nonrheumatic aortic (valve) stenosis: Secondary | ICD-10-CM | POA: Diagnosis not present

## 2024-04-29 DIAGNOSIS — D649 Anemia, unspecified: Secondary | ICD-10-CM | POA: Diagnosis not present

## 2024-04-29 HISTORY — PX: COLONOSCOPY: SHX5424

## 2024-04-29 HISTORY — PX: ESOPHAGOGASTRODUODENOSCOPY: SHX5428

## 2024-04-29 SURGERY — COLONOSCOPY
Anesthesia: Monitor Anesthesia Care

## 2024-04-29 MED ORDER — LIDOCAINE HCL (CARDIAC) PF 100 MG/5ML IV SOSY
PREFILLED_SYRINGE | INTRAVENOUS | Status: DC | PRN
  Administered 2024-04-29: 50 mg via INTRAVENOUS

## 2024-04-29 MED ORDER — STERILE WATER FOR IRRIGATION IR SOLN
Status: DC | PRN
  Administered 2024-04-29: 60 mL

## 2024-04-29 MED ORDER — LACTATED RINGERS IV SOLN: INTRAVENOUS | Status: DC

## 2024-04-29 MED ORDER — PANTOPRAZOLE SODIUM 40 MG PO TBEC: 40.0000 mg | DELAYED_RELEASE_TABLET | Freq: Every day | ORAL | 11 refills | Status: AC

## 2024-04-29 MED ORDER — EPHEDRINE SULFATE (PRESSORS) 25 MG/5ML IV SOSY
PREFILLED_SYRINGE | INTRAVENOUS | Status: DC | PRN
  Administered 2024-04-29: 10 mg via INTRAVENOUS

## 2024-04-29 MED ORDER — PROPOFOL 10 MG/ML IV BOLUS
INTRAVENOUS | Status: DC | PRN
  Administered 2024-04-29: 100 mg via INTRAVENOUS
  Administered 2024-04-29: 175 ug/kg/min via INTRAVENOUS

## 2024-04-29 NOTE — Interval H&P Note (Signed)
 History and Physical Interval Note:  04/29/2024 10:42 AM  Charles Ayala  has presented today for surgery, with the diagnosis of hx;polyps, anemia.  The various methods of treatment have been discussed with the patient and family. After consideration of risks, benefits and other options for treatment, the patient has consented to  Procedure(s) with comments: COLONOSCOPY (N/A) - 10:00 am, asa 3 EGD (ESOPHAGOGASTRODUODENOSCOPY) (N/A) as a surgical intervention.  The patient's history has been reviewed, patient examined, no change in status, stable for surgery.  I have reviewed the patient's chart and labs.  Questions were answered to the patient's satisfaction.     Charles Ayala    No dysphagia.  No change.

## 2024-04-29 NOTE — Interval H&P Note (Signed)
 History and Physical Interval Note:  04/29/2024 10:36 AM  Charles Ayala  has presented today for surgery, with the diagnosis of hx;polyps, anemia.  The various methods of treatment have been discussed with the patient and family. After consideration of risks, benefits and other options for treatment, the patient has consented to  Procedure(s) with comments: COLONOSCOPY (N/A) - 10:00 am, asa 3 EGD (ESOPHAGOGASTRODUODENOSCOPY) (N/A) as a surgical intervention.  The patient's history has been reviewed, patient examined, no change in status, stable for surgery.  I have reviewed the patient's chart and labs.  Questions were answered to the patient's satisfaction.     Charles Ayala    No change.  NSAID use.  History of colonic polyps.  EGD and colonoscopy to rule out NSAID gastropathy and recurrent polyps.  The risks, benefits, limitations, imponderables and alternatives regarding both EGD and colonoscopy have been reviewed with the patient. Questions have been answered. All parties agreeable.

## 2024-04-29 NOTE — Telephone Encounter (Signed)
-----   Message from Lamar Hollingshead sent at 04/29/2024 11:46 AM EST -----   Patient needs new prescription for Protonix 40 mg pill -   take (1) 30 minutes for breakfast daily.  Dispense 30 with 11 refills.  Patient needs to return to see whoever the app was that saw them in the office in 3 months.

## 2024-04-29 NOTE — Op Note (Signed)
 [facility] Patient Name: Charles Ayala Procedure Date: 04/29/2024 10:18 AM MRN: [id number] Date of Birth: [birth year] Attending MD: Lamar Ozell Hollingshead , MD, [id number] CSN: [id number] Age: 73 Admit Type: Outpatient Procedure:                Colonoscopy Indications:              High risk colon cancer surveillance: Personal                            history of colonic polyps Providers:                Lamar Ozell Hollingshead, MD, Crystal Page, Chad                            Wilson, Technician Referring MD:              Medicines:                Propofol  per Anesthesia Complications:            No immediate complications. Estimated Blood Loss:     Estimated blood loss: none. Procedure:                Pre-Anesthesia Assessment:                           - Prior to the procedure, a History and Physical                            was performed, and patient medications and                            allergies were reviewed. The patient's tolerance of                            previous anesthesia was also reviewed. The risks                            and benefits of the procedure and the sedation                            options and risks were discussed with the patient.                            All questions were answered, and informed consent                            was obtained. Prior Anticoagulants: The patient has                            taken no anticoagulant or antiplatelet agents. ASA                            Grade Assessment: III - A patient with severe  systemic disease. After reviewing the risks and                            benefits, the patient was deemed in satisfactory                            condition to undergo the procedure.                           After obtaining informed consent, the colonoscope                            was passed under direct vision. Throughout the                            procedure, the patient's  blood pressure, pulse, and                            oxygen saturations were monitored continuously. The                            [id number]-[id number] ([id number]) Colon was introduced through                            the anus and advanced to the the cecum, identified                            by appendiceal orifice and ileocecal valve. The                            colonoscopy was performed without difficulty. The                            patient tolerated the procedure well. The quality                            of the bowel preparation was adequate. The                            ileocecal valve, appendiceal orifice, and rectum                            were photographed. Scope In: 11:00:25 AM Scope Out: 11:17:13 AM Scope Withdrawal Time: 0 hours 5 minutes 15 seconds  Total Procedure Duration: 0 hours 16 minutes 48 seconds  Findings:      The perianal and digital rectal examinations were normal. Some right       colon redundancy requiring external abdominal pressure to reach the       cecum.      A few small-mouthed diverticula were found in the sigmoid colon and       descending colon.      External and internal hemorrhoids were found during retroflexion. The       hemorrhoids were moderate, medium-sized and Grade II (internal       hemorrhoids that prolapse  but reduce spontaneously).      The exam was otherwise without abnormality on direct and retroflexion       views. Impression:               - Diverticulosis in the sigmoid colon and in the                            descending colon. Redundant ascending colon.                           - External and internal hemorrhoids.                           - The examination was otherwise normal on direct                            and retroflexion views.                           - No specimens collected. Moderate Sedation:      Moderate (conscious) sedation was personally administered by an       anesthesia professional. The following  parameters were monitored: oxygen       saturation, heart rate, blood pressure, respiratory rate, EKG, adequacy       of pulmonary ventilation, and response to care. Recommendation:           - Patient has a contact number available for                            emergencies. The signs and symptoms of potential                            delayed complications were discussed with the                            patient. Return to normal activities tomorrow.                            Written discharge instructions were provided to the                            patient.                           - No repeat colonoscopy.                           - Return to GI clinic in 3 months. See EGD report. Procedure Code(s):        --- Professional ---                           [phone], Colonoscopy, flexible; diagnostic, including                            collection of specimen(s) by brushing or washing,  when performed (separate procedure) Diagnosis Code(s):        --- Professional ---                           Z86.010, Personal history of colonic polyps                           K64.1, Second degree hemorrhoids                           K57.30, Diverticulosis of large intestine without                            perforation or abscess without bleeding CPT copyright 2022 [employer]. All rights reserved. The codes documented in this report are preliminary and upon coder review may  be revised to meet current compliance requirements. [name]. [name], MD Lamar Ozell Hollingshead, MD 04/29/2024 12:30:04 PM This report has been signed electronically. Number of Addenda: 0

## 2024-04-29 NOTE — Telephone Encounter (Signed)
 Rx sent to pharmacy on file.  Mandy/Ladonna: please arrange f/u with app in 3 mths.

## 2024-04-29 NOTE — Op Note (Signed)
 [facility] Patient Name: Charles Ayala Procedure Date: 04/29/2024 10:21 AM MRN: [id number] Date of Birth: 05-02-1951 Attending MD: Lamar Ozell Hollingshead , MD, [id number] CSN: [id number] Age: 73 Admit Type: Outpatient Procedure:                Upper GI endoscopy Indications:              Iron deficiency anemia secondary to chronic blood                            loss Providers:                Lamar Ozell Hollingshead, MD, Crystal Page, Chad                            Wilson, Technician Referring MD:              Medicines:                Propofol  per Anesthesia Complications:            No immediate complications. Estimated Blood Loss:     Estimated blood loss was minimal. Procedure:                Pre-Anesthesia Assessment:                           - Prior to the procedure, a History and Physical                            was performed, and patient medications and                            allergies were reviewed. The patient's tolerance of                            previous anesthesia was also reviewed. The risks                            and benefits of the procedure and the sedation                            options and risks were discussed with the patient.                            All questions were answered, and informed consent                            was obtained. Prior Anticoagulants: The patient has                            taken no anticoagulant or antiplatelet agents. ASA                            Grade Assessment: III - A patient with severe  systemic disease. After reviewing the risks and                            benefits, the patient was deemed in satisfactory                            condition to undergo the procedure.                           After obtaining informed consent, the endoscope was                            passed under direct vision. Throughout the                            procedure, the patient's blood  pressure, pulse, and                            oxygen saturations were monitored continuously. The                            [id number] ([id number]) Upper was introduced through                            the mouth, and advanced to the second part of                            duodenum. The upper GI endoscopy was accomplished                            without difficulty. The patient tolerated the                            procedure well. Scope In: 10:49:30 AM Scope Out: 10:55:21 AM Total Procedure Duration: 0 hours 5 minutes 51 seconds  Findings:      The examined esophagus was normal.      A small hiatal hernia was present. Normal-appearing gastric mucosa.       Patent pylorus. Markedly edematous bulb and proximal second portion.       Mucosal erosions present. No overt ulcer seen. The more distal second       portion appeared normal.      Gastric biopsies for H. pylori taken. Impression:               - Normal esophagus.                           - Small hiatal hernia. Normal gastric mucosa status                            post gastric biopsy.                           -Marked edema and erosions of the mucosa from the  bulb through the second portion. Likely NSAID                            gastropathy. Cannot rule out a small ulcer buried                            in between edematous folds. Moderate Sedation:      Moderate (conscious) sedation was personally administered by an       anesthesia professional. The following parameters were monitored: oxygen       saturation, heart rate, blood pressure, respiratory rate, EKG, adequacy       of pulmonary ventilation, and response to care. Recommendation:           - Patient has a contact number available for                            emergencies. The signs and symptoms of potential                            delayed complications were discussed with the                            patient. Return to normal  activities tomorrow.                            Written discharge instructions were provided to the                            patient.                           - Advance diet as tolerated. New prescription for                            Protonix 40 mg once daily.. NSAID use. Follow-up on                            pathology. Office visit with us  in 3 months Procedure Code(s):        --- Professional ---                           [phone], Esophagogastroduodenoscopy, flexible,                            transoral; diagnostic, including collection of                            specimen(s) by brushing or washing, when performed                            (separate procedure) Diagnosis Code(s):        --- Professional ---                           K44.9, Diaphragmatic hernia without obstruction or  gangrene                           D50.0, Iron deficiency anemia secondary to blood                            loss (chronic) CPT copyright 2022 American Medical Association. All rights reserved. The codes documented in this report are preliminary and upon coder review may  be revised to meet current compliance requirements. [name]. Landy Dunnavant, MD Lamar Ozell Hollingshead, MD 04/29/2024 11:41:48 AM This report has been signed electronically. Number of Addenda: 0

## 2024-04-29 NOTE — Anesthesia Preprocedure Evaluation (Signed)
 Anesthesia Evaluation  Patient identified by MRN, date of birth, ID band Patient awake    Reviewed: Allergy & Precautions, H&P , NPO status , Patient's Chart, lab work & pertinent test results, reviewed documented beta blocker date and time   Airway Mallampati: II  TM Distance: >3 FB Neck ROM: full    Dental no notable dental hx.    Pulmonary neg pulmonary ROS   Pulmonary exam normal breath sounds clear to auscultation       Cardiovascular Exercise Tolerance: Good hypertension, + Peripheral Vascular Disease  + Valvular Problems/Murmurs (mild) AS  Rhythm:regular Rate:Normal     Neuro/Psych  Neuromuscular disease negative neurological ROS  negative psych ROS   GI/Hepatic negative GI ROS, Neg liver ROS,,,  Endo/Other  negative endocrine ROS    Renal/GU negative Renal ROS  negative genitourinary   Musculoskeletal   Abdominal   Peds  Hematology negative hematology ROS (+) Blood dyscrasia, anemia   Anesthesia Other Findings   Reproductive/Obstetrics negative OB ROS                              Anesthesia Physical Anesthesia Plan  ASA: 3  Anesthesia Plan: MAC   Post-op Pain Management:    Induction:   PONV Risk Score and Plan: Propofol  infusion  Airway Management Planned:   Additional Equipment:   Intra-op Plan:   Post-operative Plan:   Informed Consent: I have reviewed the patients History and Physical, chart, labs and discussed the procedure including the risks, benefits and alternatives for the proposed anesthesia with the patient or authorized representative who has indicated his/her understanding and acceptance.     Dental Advisory Given  Plan Discussed with: CRNA  Anesthesia Plan Comments:         Anesthesia Quick Evaluation

## 2024-04-29 NOTE — Transfer of Care (Signed)
 Immediate Anesthesia Transfer of Care Note  Patient: Charles Ayala  Procedure(s) Performed: COLONOSCOPY EGD (ESOPHAGOGASTRODUODENOSCOPY)  Patient Location: Short Stay  Anesthesia Type:General  Level of Consciousness: drowsy, patient cooperative, and responds to stimulation  Airway & Oxygen Therapy: Patient Spontanous Breathing  Post-op Assessment: Report given to RN and Post -op Vital signs reviewed and stable  Post vital signs: Reviewed and stable  Last Vitals:  Vitals Value Taken Time  BP 95/44 04/29/24 11:22  Temp 36.7 C 04/29/24 11:22  Pulse 77   Resp 18 04/29/24 11:22  SpO2 100 % 04/29/24 11:22    Last Pain:  Vitals:   04/29/24 1122  TempSrc: Axillary  PainSc: Asleep      Patients Stated Pain Goal: 7 (04/29/24 0842)  Complications: No notable events documented.

## 2024-04-29 NOTE — Discharge Instructions (Signed)
 EGD Discharge instructions Please read the instructions outlined below and refer to this sheet in the next few weeks. These discharge instructions provide you with general information on caring for yourself after you leave the hospital. Your doctor may also give you specific instructions. While your treatment has been planned according to the most current medical practices available, unavoidable complications occasionally occur. If you have any problems or questions after discharge, please call your doctor. ACTIVITY You may resume your regular activity but move at a slower pace for the next 24 hours.  Take frequent rest periods for the next 24 hours.  Walking will help expel (get rid of) the air and reduce the bloated feeling in your abdomen.  No driving for 24 hours (because of the anesthesia (medicine) used during the test).  You may shower.  Do not sign any important legal documents or operate any machinery for 24 hours (because of the anesthesia used during the test).  NUTRITION Drink plenty of fluids.  You may resume your normal diet.  Begin with a light meal and progress to your normal diet.  Avoid alcoholic beverages for 24 hours or as instructed by your caregiver.  MEDICATIONS You may resume your normal medications unless your caregiver tells you otherwise.  WHAT YOU CAN EXPECT TODAY You may experience abdominal discomfort such as a feeling of fullness or "gas" pains.  FOLLOW-UP Your doctor will discuss the results of your test with you.  SEEK IMMEDIATE MEDICAL ATTENTION IF ANY OF THE FOLLOWING OCCUR: Excessive nausea (feeling sick to your stomach) and/or vomiting.  Severe abdominal pain and distention (swelling).  Trouble swallowing.  Temperature over 101 F (37.8 C).  Rectal bleeding or vomiting of blood.    Colonoscopy Discharge Instructions  Read the instructions outlined below and refer to this sheet in the next few weeks. These discharge instructions provide you with  general information on caring for yourself after you leave the hospital. Your doctor may also give you specific instructions. While your treatment has been planned according to the most current medical practices available, unavoidable complications occasionally occur. If you have any problems or questions after discharge, call Dr. Shaaron at [phone]. ACTIVITY You may resume your regular activity, but move at a slower pace for the next 24 hours.  Take frequent rest periods for the next 24 hours.  Walking will help get rid of the air and reduce the bloated feeling in your belly (abdomen).  No driving for 24 hours (because of the medicine (anesthesia) used during the test).   Do not sign any important legal documents or operate any machinery for 24 hours (because of the anesthesia used during the test).  NUTRITION Drink plenty of fluids.  You may resume your normal diet as instructed by your doctor.  Begin with a light meal and progress to your normal diet. Heavy or fried foods are harder to digest and may make you feel sick to your stomach (nauseated).  Avoid alcoholic beverages for 24 hours or as instructed.  MEDICATIONS You may resume your normal medications unless your doctor tells you otherwise.  WHAT YOU CAN EXPECT TODAY Some feelings of bloating in the abdomen.  Passage of more gas than usual.  Spotting of blood in your stool or on the toilet paper.  IF YOU HAD POLYPS REMOVED DURING THE COLONOSCOPY: No aspirin  products for 7 days or as instructed.  No alcohol  for 7 days or as instructed.  Eat a soft diet for the next 24 hours.  FINDING  OUT THE RESULTS OF YOUR TEST Not all test results are available during your visit. If your test results are not back during the visit, make an appointment with your caregiver to find out the results. Do not assume everything is normal if you have not heard from your caregiver or the medical facility. It is important for you to follow up on all of your test  results.  SEEK IMMEDIATE MEDICAL ATTENTION IF: You have more than a spotting of blood in your stool.  Your belly is swollen (abdominal distention).  You are nauseated or vomiting.  You have a temperature over 101.  You have abdominal pain or discomfort that is severe or gets worse throughout the day.     No polyps found in your colon today you do have diverticulosis  A future colonoscopy is not recommended unless new symptoms develop  You have swelling and irritation in your duodenum (small intestine) may be related to NSAIDs.  Samples taken.    Would cut back and stop all NSAIDs   new prescription for Protonix 40 mg once daily take 30 minutes before breakfast.  My office is calling a new prescription into your pharmacy   further recommendations to follow pending review of pathology report   office visit with us  in 3 months  At patient request, I called Jamesyn Lindell at [phone].    Reviewed findings and recommendations.  Her questions were answered.

## 2024-05-01 ENCOUNTER — Encounter (HOSPITAL_COMMUNITY): Payer: Self-pay | Admitting: Internal Medicine

## 2024-05-01 LAB — SURGICAL PATHOLOGY

## 2024-05-01 NOTE — Anesthesia Postprocedure Evaluation (Signed)
 Anesthesia Post Note  Patient: Charles Ayala  Procedure(s) Performed: COLONOSCOPY EGD (ESOPHAGOGASTRODUODENOSCOPY)  Patient location during evaluation: Phase II Anesthesia Type: MAC Level of consciousness: awake Pain management: pain level controlled Vital Signs Assessment: post-procedure vital signs reviewed and stable Respiratory status: spontaneous breathing and respiratory function stable Cardiovascular status: blood pressure returned to baseline and stable Postop Assessment: no headache and no apparent nausea or vomiting Anesthetic complications: no Comments: Late entry   No notable events documented.   Last Vitals:  Vitals:   04/29/24 1122 04/29/24 1127  BP: (!) 95/44 (!) 102/52  Pulse: 65 75  Resp: 18   Temp: 36.7 C   SpO2: 100%     Last Pain:  Vitals:   04/30/24 0946  TempSrc:   PainSc: 0-No pain                 [name]

## 2024-05-02 ENCOUNTER — Ambulatory Visit: Payer: Self-pay | Admitting: Internal Medicine

## 2024-05-06 ENCOUNTER — Ambulatory Visit: Attending: Cardiology | Admitting: Cardiology

## 2024-05-06 ENCOUNTER — Encounter: Payer: Self-pay | Admitting: Internal Medicine

## 2024-05-06 ENCOUNTER — Encounter: Payer: Self-pay | Admitting: Cardiology

## 2024-05-06 VITALS — BP 132/70 | HR 50 | Ht 72.0 in | Wt 167.0 lb

## 2024-05-06 DIAGNOSIS — E782 Mixed hyperlipidemia: Secondary | ICD-10-CM | POA: Diagnosis not present

## 2024-05-06 DIAGNOSIS — I10 Essential (primary) hypertension: Secondary | ICD-10-CM | POA: Diagnosis not present

## 2024-05-06 DIAGNOSIS — I739 Peripheral vascular disease, unspecified: Secondary | ICD-10-CM | POA: Diagnosis not present

## 2024-05-06 DIAGNOSIS — I350 Nonrheumatic aortic (valve) stenosis: Secondary | ICD-10-CM | POA: Diagnosis not present

## 2024-05-06 NOTE — Patient Instructions (Signed)
 Medication Instructions:  Your physician recommends that you continue on your current medications as directed. Please refer to the Current Medication list given to you today.   Labwork: None today  Testing/Procedures: None today  Follow-Up: 1 year  Any Other Special Instructions Will Be Listed Below (If Applicable).  If you need a refill on your cardiac medications before your next appointment, please call your pharmacy.

## 2024-05-06 NOTE — Progress Notes (Signed)
 Cardiology Office Note  Date: 05/06/2024   ID: Charles Ayala, DOB 07/02/1950, MRN [id number]  History of Present Illness: Charles Ayala is a 73 y.o. male last seen in January by Ms. Miriam NP, I reviewed her note.  He is here today with his wife for a follow-up visit.  Reports no exertional chest pain, stable NYHA class I-II dyspnea, no palpitations or syncope.  Also no claudication or distal ulcerations.  Has been having some trouble with lower back pain.  I reviewed his medications.  Now taking Crestor  40 mg daily with Zetia  10 mg daily.  Requesting interval lab work from [employer].  I reviewed his echocardiogram from December 2024.  No progression in aortic stenosis.  Physical Exam: VS:  BP 132/70 (BP Location: Left Arm)   Pulse (!) 50   Ht 6' (1.829 m)   Wt 167 lb (75.8 kg)   SpO2 100%   BMI 22.65 kg/m , BMI Body mass index is 22.65 kg/m.  Wt Readings from Last 3 Encounters:  05/06/24 167 lb (75.8 kg)  04/29/24 167 lb 15.9 oz (76.2 kg)  04/07/24 168 lb (76.2 kg)    General: Patient appears comfortable at rest. HEENT: Conjunctiva and lids normal. Neck: Supple, no elevated JVP or carotid bruits. Lungs: Clear to auscultation, nonlabored breathing at rest. Cardiac: Regular rate and rhythm, no S3, 2/6 systolic murmur. Extremities: No pitting edema.  ECG:  An ECG dated 11/26/2023 was personally reviewed today and demonstrated:  Sinus bradycardia.  Labwork:  July 2024: Hemoglobin 12.1, platelets 204, BUN 12, creatinine 0.91, GFR 90, potassium 4.1, AST 17, ALT 10, cholesterol 178, glycerides 80, HDL 69, LDL 94, hemoglobin A1c 5.5%, TSH 1.96  Other Studies Reviewed Today:  Echocardiogram 06/13/2023:  1. Left ventricular ejection fraction, by estimation, is 65 to 70%. The  left ventricle has normal function. The left ventricle has no regional  wall motion abnormalities. There is mild concentric left ventricular  hypertrophy. Left ventricular diastolic  parameters are  consistent with Grade I diastolic dysfunction (impaired  relaxation).   2. Right ventricular systolic function is normal. The right ventricular  size is normal. There is normal pulmonary artery systolic pressure. The  estimated right ventricular systolic pressure is 28.2 mmHg.   3. The mitral valve is abnormal, mildly thickened and with mild posterior  leaflet prolpase. Mild mitral valve regurgitation. No evidence of mitral  stenosis. The mean mitral valve gradient is 2.0 mmHg.   4. The aortic valve is tricuspid. There is mild calcification of the  aortic valve. Aortic valve regurgitation is not visualized. Mild aortic  valve stenosis. Aortic valve mean gradient measures 12.0 mmHg.  Dimentionless index 0.53.   5. Aortic dilatation noted. There is borderline dilatation of the aortic  root, measuring 39 mm.   6. The inferior vena cava is normal in size with greater than 50%  respiratory variability, suggesting right atrial pressure of 3 mmHg.   Assessment and Plan:  1.  Degenerative calcific aortic stenosis, mild by echocardiogram in December 2024 at which point mean AV gradient was 12 mmHg and dimensionless index 0.53.  He is asymptomatic.   2.  Mixed hyperlipidemia.  LDL 94 and HDL 69 in July 7975.  Requesting interval lab work from PCP.  He is currently on Crestor  40 mg daily (increased in the interim) and Zetia  10 mg daily.   3.  Primary hypertension.  Continue with HCTZ 25 mg daily and keep follow-up with PCP.   4.  PAD, followed  by Dr. Darron and last seen in June.  History of significant right SFA stenosis with moderately reduced ABI.  No claudication or distal ulcerations.  Continue aspirin  and statin.   5.  Right carotid bruit noted carotid Dopplers in December 2023 showing no obstructive carotid artery disease.  Disposition:  Follow up 1 year.  Signed, Jayson JUDITHANN Sierras, M.D., F.A.C.C. [facility]

## 2024-05-11 ENCOUNTER — Encounter: Payer: Self-pay | Admitting: Cardiology

## 2024-07-14 ENCOUNTER — Other Ambulatory Visit: Payer: Self-pay | Admitting: Internal Medicine

## 2024-07-30 ENCOUNTER — Ambulatory Visit: Admitting: Gastroenterology
